# Patient Record
Sex: Male | Born: 1940
Health system: Southern US, Community
[De-identification: ages and names within clinical notes are randomized; demographics above are authoritative.]

## PROBLEM LIST (undated history)

## (undated) DIAGNOSIS — R011 Cardiac murmur, unspecified: Secondary | ICD-10-CM

## (undated) DIAGNOSIS — I4891 Unspecified atrial fibrillation: Secondary | ICD-10-CM

## (undated) DIAGNOSIS — I5042 Chronic combined systolic (congestive) and diastolic (congestive) heart failure: Secondary | ICD-10-CM

## (undated) DIAGNOSIS — I700 Atherosclerosis of aorta: Secondary | ICD-10-CM

## (undated) DIAGNOSIS — M109 Gout, unspecified: Secondary | ICD-10-CM

## (undated) DIAGNOSIS — E349 Endocrine disorder, unspecified: Secondary | ICD-10-CM

## (undated) DIAGNOSIS — N200 Calculus of kidney: Secondary | ICD-10-CM

## (undated) DIAGNOSIS — Z87442 Personal history of urinary calculi: Secondary | ICD-10-CM

## (undated) DIAGNOSIS — M1710 Unilateral primary osteoarthritis, unspecified knee: Secondary | ICD-10-CM

## (undated) DIAGNOSIS — N529 Male erectile dysfunction, unspecified: Secondary | ICD-10-CM

## (undated) DIAGNOSIS — F419 Anxiety disorder, unspecified: Secondary | ICD-10-CM

## (undated) DIAGNOSIS — E785 Hyperlipidemia, unspecified: Secondary | ICD-10-CM

## (undated) DIAGNOSIS — H269 Unspecified cataract: Secondary | ICD-10-CM

## (undated) DIAGNOSIS — R609 Edema, unspecified: Secondary | ICD-10-CM

## (undated) DIAGNOSIS — M519 Unspecified thoracic, thoracolumbar and lumbosacral intervertebral disc disorder: Secondary | ICD-10-CM

## (undated) DIAGNOSIS — I350 Nonrheumatic aortic (valve) stenosis: Secondary | ICD-10-CM

## (undated) DIAGNOSIS — I482 Chronic atrial fibrillation, unspecified: Secondary | ICD-10-CM

## (undated) DIAGNOSIS — R0602 Shortness of breath: Secondary | ICD-10-CM

## (undated) DIAGNOSIS — I10 Essential (primary) hypertension: Secondary | ICD-10-CM

## (undated) HISTORY — DX: Hyperlipidemia, unspecified: E78.5

## (undated) HISTORY — DX: Unspecified cataract: H26.9

## (undated) HISTORY — PX: OTHER SURGICAL HISTORY: SHX169

## (undated) HISTORY — DX: Calculus of kidney: N20.0

## (undated) HISTORY — PX: ACHILLES TENDON REPAIR: SUR1153

## (undated) HISTORY — DX: Gout, unspecified: M10.9

## (undated) HISTORY — DX: Unspecified thoracic, thoracolumbar and lumbosacral intervertebral disc disorder: M51.9

## (undated) HISTORY — DX: Atherosclerosis of aorta: I70.0

## (undated) HISTORY — DX: Unspecified atrial fibrillation: I48.91

## (undated) HISTORY — DX: Shortness of breath: R06.02

## (undated) HISTORY — PX: TONSILLECTOMY: SUR1361

## (undated) HISTORY — DX: Cardiac murmur, unspecified: R01.1

## (undated) HISTORY — DX: Male erectile dysfunction, unspecified: N52.9

## (undated) HISTORY — DX: Endocrine disorder, unspecified: E34.9

## (undated) HISTORY — DX: Chronic atrial fibrillation, unspecified: I48.20

## (undated) HISTORY — DX: Anxiety disorder, unspecified: F41.9

## (undated) HISTORY — DX: Unilateral primary osteoarthritis, unspecified knee: M17.10

## (undated) HISTORY — DX: Edema, unspecified: R60.9

---

## 2004-05-18 ENCOUNTER — Ambulatory Visit: Payer: Self-pay | Admitting: Urology

## 2005-04-12 ENCOUNTER — Ambulatory Visit: Payer: Self-pay | Admitting: Physician Assistant

## 2005-05-16 ENCOUNTER — Ambulatory Visit: Payer: Self-pay | Admitting: Neurosurgery

## 2006-09-05 ENCOUNTER — Ambulatory Visit: Payer: Self-pay | Admitting: Urology

## 2006-12-04 ENCOUNTER — Ambulatory Visit: Payer: Self-pay | Admitting: Unknown Physician Specialty

## 2006-12-11 ENCOUNTER — Ambulatory Visit: Payer: Self-pay | Admitting: Neurosurgery

## 2006-12-18 ENCOUNTER — Ambulatory Visit: Payer: Self-pay | Admitting: Unknown Physician Specialty

## 2006-12-28 ENCOUNTER — Emergency Department (HOSPITAL_COMMUNITY): Admission: EM | Admit: 2006-12-28 | Discharge: 2006-12-29 | Payer: Self-pay | Admitting: Emergency Medicine

## 2007-02-07 ENCOUNTER — Ambulatory Visit: Payer: Self-pay | Admitting: Internal Medicine

## 2008-10-28 ENCOUNTER — Ambulatory Visit: Payer: Self-pay | Admitting: Urology

## 2008-11-05 ENCOUNTER — Ambulatory Visit: Payer: Self-pay | Admitting: Urology

## 2008-11-10 ENCOUNTER — Ambulatory Visit: Payer: Self-pay | Admitting: Urology

## 2009-09-21 ENCOUNTER — Ambulatory Visit: Payer: Self-pay | Admitting: Urology

## 2009-09-22 ENCOUNTER — Ambulatory Visit: Payer: Self-pay | Admitting: Urology

## 2010-07-10 ENCOUNTER — Encounter: Payer: Self-pay | Admitting: Neurological Surgery

## 2011-05-01 ENCOUNTER — Ambulatory Visit: Payer: Self-pay | Admitting: Surgery

## 2012-07-08 ENCOUNTER — Ambulatory Visit: Payer: Self-pay | Admitting: Neurosurgery

## 2013-04-07 DIAGNOSIS — Z87442 Personal history of urinary calculi: Secondary | ICD-10-CM | POA: Insufficient documentation

## 2013-11-12 DIAGNOSIS — M519 Unspecified thoracic, thoracolumbar and lumbosacral intervertebral disc disorder: Secondary | ICD-10-CM | POA: Insufficient documentation

## 2013-11-18 ENCOUNTER — Ambulatory Visit: Payer: Self-pay | Admitting: Internal Medicine

## 2014-04-29 ENCOUNTER — Ambulatory Visit (HOSPITAL_COMMUNITY)
Admission: RE | Admit: 2014-04-29 | Discharge: 2014-04-29 | Disposition: A | Discharge status: Home / Self Care | Payer: Medicare Other | Source: Ambulatory Visit | Attending: Cardiovascular Disease | Admitting: Cardiovascular Disease

## 2014-04-29 ENCOUNTER — Other Ambulatory Visit (HOSPITAL_COMMUNITY): Payer: Self-pay | Admitting: Specialist

## 2014-04-29 DIAGNOSIS — M79661 Pain in right lower leg: Secondary | ICD-10-CM

## 2014-04-29 DIAGNOSIS — I82401 Acute embolism and thrombosis of unspecified deep veins of right lower extremity: Secondary | ICD-10-CM

## 2014-04-29 NOTE — Progress Notes (Signed)
Right Lower Ext. Venous Duplex Completed. No evidence of DVT or SVT. Oda Cogan, BS, RDMS, RVT

## 2014-05-01 ENCOUNTER — Telehealth (HOSPITAL_COMMUNITY): Payer: Self-pay | Admitting: *Deleted

## 2014-09-02 ENCOUNTER — Encounter (HOSPITAL_BASED_OUTPATIENT_CLINIC_OR_DEPARTMENT_OTHER): Payer: Self-pay

## 2014-09-02 NOTE — Progress Notes (Signed)
Date/Time: 09/02/2014, 2:44 PM  Interviewer: Melissa Noon  Patient Name: Danny Silva   DOB: 02/14/1941  SSN: [id number]  Gender: male  Patient Address:  [address]   [place] [address]      Patient Phone Number(s): [phone] (M)  Emergency Contact:  In chart, to be contacted ONLY in case of emergency     Caller:  Self   , Caller phone: , Caller relationship:  Reason for Call:  Pt reports continuous struggle with stopping medication including klonopin, lyrica, and suboxone   Referral Source: Oletta Cohn  Notes:    Insurance Info:  Pt is willing to self pay if insurance does not approve or cover.  Pt prefers inpt, as he has health concerns and lives in [place]   Company:  [employer]    Phone number: Providers: [phone]   Policy Holder [facility]):     Relationship to PT: self   PH DOB: [birth year]   Member ID: [id number]  Group #: [id number]  Insured Employer: self-purchased      Medicare     Clinical Information  Presenting Problem: wants to stop medication   Precipitating Event: Pt has been on suboxone maintenance for several years and has not been successful stopping or reducing use     Suicidal/Homicidal Risk?    Any hx of Suicidal thoughts or plans? No    Any hx of Homicidal Thoughts or Plans?  No    Imminent Risk of Suicide and/or Harm to Others?  No         Drug Use   Any current use of alcohol or drugs? narcotics and benzodiazepines    Do you use tobacco?: No    Drug of Choice # 1: Suboxone   Pattern of use: daily  8 mg films, 2.5 a day   Last use? 09/02/2014    Drug of Choice # 2 : Klonopin  Pattern of use: daily 1.5 - 2 mg daily   Last use? 09/02/14    Drug of Choice # 3 : Lyrica   Pattern of use: q8-10 hours 75 mg TID   Last use?: 09/02/2014    Additional drug use info:     Current Withdrawal Symptoms:  Denies, as he is currently using     History of and last date of:    Hallucinations? No          Seizures?  No    Psych/Mental health Issues:  Anxiety , difficulty sleeping     Medical issues:  Knee repair this  year, hx of kidney stones, denies anything currently     Current Medication (medical or psychiatric): Klonopin 1 mg 2 tabs     Additional Notes:  Pt was started on suboxone 4 years ago as a way to get off opiate pain medication.  He has been unsuccessful titration down with current provider.     Does the patient require Hard of Hearing Services? No    Does the patient require language services? 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

## 2015-02-11 ENCOUNTER — Other Ambulatory Visit: Payer: Self-pay | Admitting: Internal Medicine

## 2015-02-11 DIAGNOSIS — E785 Hyperlipidemia, unspecified: Secondary | ICD-10-CM

## 2015-02-11 DIAGNOSIS — E782 Mixed hyperlipidemia: Secondary | ICD-10-CM | POA: Insufficient documentation

## 2015-02-16 ENCOUNTER — Ambulatory Visit
Admission: RE | Admit: 2015-02-16 | Discharge: 2015-02-16 | Disposition: A | Discharge status: Home / Self Care | Payer: Medicare Other | Source: Ambulatory Visit | Attending: Internal Medicine | Admitting: Internal Medicine

## 2015-02-16 DIAGNOSIS — E785 Hyperlipidemia, unspecified: Secondary | ICD-10-CM | POA: Insufficient documentation

## 2015-11-03 DIAGNOSIS — D369 Benign neoplasm, unspecified site: Secondary | ICD-10-CM | POA: Insufficient documentation

## 2016-03-21 ENCOUNTER — Ambulatory Visit
Admission: RE | Admit: 2016-03-21 | Discharge: 2016-03-21 | Disposition: A | Discharge status: Home / Self Care | Payer: Medicare Other | Source: Ambulatory Visit | Attending: Neurosurgery | Admitting: Neurosurgery

## 2016-03-21 ENCOUNTER — Other Ambulatory Visit: Payer: Self-pay | Admitting: Neurosurgery

## 2016-03-21 DIAGNOSIS — M4302 Spondylolysis, cervical region: Secondary | ICD-10-CM | POA: Diagnosis not present

## 2016-03-21 DIAGNOSIS — M8580 Other specified disorders of bone density and structure, unspecified site: Secondary | ICD-10-CM | POA: Diagnosis not present

## 2016-11-29 ENCOUNTER — Other Ambulatory Visit: Payer: Self-pay | Admitting: Neurosurgery

## 2016-11-29 DIAGNOSIS — M4306 Spondylolysis, lumbar region: Secondary | ICD-10-CM

## 2016-12-07 ENCOUNTER — Ambulatory Visit
Admission: RE | Admit: 2016-12-07 | Discharge: 2016-12-07 | Disposition: A | Discharge status: Home / Self Care | Payer: Medicare Other | Source: Ambulatory Visit | Attending: Neurosurgery | Admitting: Neurosurgery

## 2016-12-07 DIAGNOSIS — Q619 Cystic kidney disease, unspecified: Secondary | ICD-10-CM | POA: Insufficient documentation

## 2016-12-07 DIAGNOSIS — M47816 Spondylosis without myelopathy or radiculopathy, lumbar region: Secondary | ICD-10-CM | POA: Diagnosis present

## 2016-12-07 DIAGNOSIS — M4306 Spondylolysis, lumbar region: Secondary | ICD-10-CM

## 2017-05-02 ENCOUNTER — Telehealth: Payer: Self-pay | Admitting: Urology

## 2017-05-02 DIAGNOSIS — E291 Testicular hypofunction: Secondary | ICD-10-CM

## 2017-05-02 NOTE — Telephone Encounter (Signed)
Pt assistant, Pamala Hurry (work's for Mr. Fess) called office asking for Testosterone refill for Devin Buckley.  Please advise. Thanks.

## 2017-05-04 NOTE — Telephone Encounter (Signed)
Okay to call in testosterone cypionate 200 mg IM every 2 weeks.  He needs a testosterone level, PSA and hematocrit in approximately 1 month

## 2017-05-14 MED ORDER — TESTOSTERONE CYPIONATE 200 MG/ML IM SOLN: 200.0000 mg | INTRAMUSCULAR | 0 refills | Status: DC

## 2017-05-14 NOTE — Addendum Note (Signed)
Addended by: Toniann Fail C on: 05/14/2017 12:00 PM   Modules accepted: Orders

## 2017-05-14 NOTE — Telephone Encounter (Signed)
Spoke with pt in reference to medication refill and needing labs/OV. Pt voiced understanding. Lab and OV appts made. Orders placed.

## 2017-05-21 ENCOUNTER — Other Ambulatory Visit: Payer: Medicare Other

## 2017-05-21 DIAGNOSIS — E291 Testicular hypofunction: Secondary | ICD-10-CM

## 2017-05-22 ENCOUNTER — Telehealth: Payer: Self-pay

## 2017-05-22 LAB — HEMATOCRIT: HEMATOCRIT: 49.4 % (ref 37.5–51.0)

## 2017-05-22 LAB — PSA: Prostate Specific Ag, Serum: 1.3 ng/mL (ref 0.0–4.0)

## 2017-05-22 LAB — TESTOSTERONE

## 2017-05-22 NOTE — Telephone Encounter (Signed)
-----   Message from Abbie Sons, MD sent at 05/22/2017  8:55 AM EST ----- Testosterone level was significantly elevated at >1500.  Chart review indicated he is taking 200 mg every 2 weeks.  Please verify this dose and see when his last injection was performed in relation to this blood draw.  Recommend he hold his testosterone for 1 month and repeat a testosterone level at that time.  PSA and hematocrit were normal.

## 2017-05-22 NOTE — Telephone Encounter (Signed)
Spoke with pt in reference to testosterone labs and needing to stop medication x26month and redraw labs. Pt stated that he had his injection 1 week prior to labs. Pt voiced understanding of whole conversation and requested lab results be mailed to him.

## 2017-05-30 ENCOUNTER — Ambulatory Visit: Payer: Self-pay | Admitting: Urology

## 2017-05-30 ENCOUNTER — Encounter: Payer: Self-pay | Admitting: Urology

## 2017-06-13 ENCOUNTER — Other Ambulatory Visit: Payer: Self-pay

## 2017-06-13 ENCOUNTER — Other Ambulatory Visit: Payer: Medicare Other

## 2017-06-13 DIAGNOSIS — E291 Testicular hypofunction: Secondary | ICD-10-CM

## 2017-06-14 ENCOUNTER — Other Ambulatory Visit: Payer: Self-pay

## 2017-06-14 ENCOUNTER — Telehealth: Payer: Self-pay

## 2017-06-14 ENCOUNTER — Other Ambulatory Visit: Payer: Self-pay | Admitting: Urology

## 2017-06-14 LAB — TESTOSTERONE: TESTOSTERONE: 153 ng/dL — AB (ref 264–916)

## 2017-06-14 MED ORDER — TESTOSTERONE CYPIONATE 200 MG/ML IM SOLN: 100.0000 mg | INTRAMUSCULAR | 0 refills | Status: DC

## 2017-06-14 NOTE — Telephone Encounter (Signed)
Pt requested testosterone refill be sent to [facility]. Lab appt has been made and results given.

## 2017-06-14 NOTE — Telephone Encounter (Signed)
Patient notified and scheduled 

## 2017-06-14 NOTE — Telephone Encounter (Signed)
-----   Message from Abbie Sons, MD sent at 06/14/2017  7:51 AM EST ----- T level was 153. Restart testosterone at 100 mg (0.5cc) every 2 weeks.  Repeat T level in 4-6 weeks

## 2017-06-14 NOTE — Telephone Encounter (Signed)
Left pt mess to call 

## 2017-06-15 MED ORDER — TESTOSTERONE CYPIONATE 200 MG/ML IM SOLN: 100.0000 mg | INTRAMUSCULAR | 0 refills | Status: DC

## 2017-06-15 NOTE — Telephone Encounter (Signed)
Rx sent 

## 2017-07-11 ENCOUNTER — Telehealth: Payer: Self-pay

## 2017-07-11 NOTE — Telephone Encounter (Signed)
Pt called stating since starting testosterone he has been loosing large amounts of hair. Pt stated that he is seeing Dr. Jeanene Erb for hair transplants with stem cells via [facility]. Pt stated that Dr. Jeanene Erb advised him to contact [facility] as he feels the hair loss is from the testosterone. Please advise.

## 2017-07-15 NOTE — Telephone Encounter (Signed)
Testosterone can cause hair loss in genetically predisposed individuals.  I would recommend discontinuing the testosterone if he is concerned about the hair loss.  He can check with his dermatologist to see if he or she thinks that a 5 alpha reductase inhibitor would be of benefit.

## 2017-07-17 NOTE — Telephone Encounter (Signed)
Pt has an appt scheduled as he wishes to discuss further with Dr. Bernardo Heater.

## 2017-07-19 ENCOUNTER — Ambulatory Visit (INDEPENDENT_AMBULATORY_CARE_PROVIDER_SITE_OTHER): Payer: Medicare Other | Admitting: Urology

## 2017-07-19 ENCOUNTER — Encounter: Payer: Self-pay | Admitting: Urology

## 2017-07-19 DIAGNOSIS — E291 Testicular hypofunction: Secondary | ICD-10-CM | POA: Diagnosis not present

## 2017-07-19 MED ORDER — FINASTERIDE 1 MG PO TABS: 1.0000 mg | ORAL_TABLET | Freq: Every day | ORAL | 3 refills | Status: DC

## 2017-07-19 NOTE — Progress Notes (Signed)
   07/19/2017 12:39 PM   Devin Buckley [birth year] [id number]  Referring provider: Rusty Aus, MD [place] [facility] [place], [place] [address]  Chief Complaint  Patient presents with  . Other    discuss hair loss with testosterone therapy    HPI: 77 year old male presents for the above chief complaint.  He remains on testosterone therapy at a lower dose which was adjusted due to elevated levels.  He is in the process of getting stem cell therapy for hair loss and has noted increased hair loss since starting testosterone.   PMH: No past medical history on file.  Surgical History: History reviewed. No pertinent surgical history.  Home Medications:  Allergies as of 07/19/2017   No Known Allergies     Medication List        Accurate as of 07/19/17 12:39 PM. Always use your most recent med list.          clonazePAM 0.5 MG tablet Commonly known as:  KLONOPIN Take by mouth.   Naltrexone-Bupropion HCl ER 8-90 MG Tb12 Take 8-90 mg by mouth.   testosterone cypionate 200 MG/ML injection Commonly known as:  DEPOTESTOSTERONE CYPIONATE Inject 0.5 mLs (100 mg total) into the muscle every 14 (fourteen) days.       Allergies: No Known Allergies  Family History: No family history on file.  Social History:  reports that he has quit smoking. he has never used smokeless tobacco. He reports that he does not drink alcohol or use drugs.  ROS: UROLOGY Frequent Urination?: No Hard to postpone urination?: No Burning/pain with urination?: No Get up at night to urinate?: No Leakage of urine?: No Urine stream starts and stops?: No Trouble starting stream?: No Do you have to strain to urinate?: No Blood in urine?: No Urinary tract infection?: No Sexually transmitted disease?: No Injury to kidneys or bladder?: No Painful intercourse?: No Weak stream?: No Erection problems?: No Penile pain?: No  Gastrointestinal Nausea?: No Vomiting?:  No Indigestion/heartburn?: No Diarrhea?: No Constipation?: No  Constitutional Fever: No Night sweats?: No Weight loss?: No Fatigue?: No  Skin Skin rash/lesions?: No Itching?: No  Eyes Blurred vision?: No Double vision?: No  Ears/Nose/Throat Sore throat?: No Sinus problems?: No  Hematologic/Lymphatic Swollen glands?: No Easy bruising?: No  Cardiovascular Leg swelling?: No Chest pain?: No  Respiratory Cough?: No Shortness of breath?: No  Endocrine Excessive thirst?: No  Musculoskeletal Back pain?: No Joint pain?: No  Neurological Headaches?: No Dizziness?: No  Psychologic Depression?: No Anxiety?: No  Physical Exam: There were no vitals taken for this visit.  Constitutional:  Alert and oriented, No acute distress.   Laboratory Data: Lab Results  Component Value Date   HCT 49.4 05/21/2017    Lab Results  Component Value Date   PSA1 1.3 05/21/2017    Lab Results  Component Value Date   TESTOSTERONE 153 (L) 06/13/2017     Assessment & Plan:  Discussed the possibility of DHT related hair loss.  Rx finasteride 1 mg was sent to his pharmacy.  He is due for a follow-up testosterone level next week.  Recent PSA was unremarkable.    Abbie Sons, [place] [address], [place], [place] [address] [phone]

## 2017-07-25 ENCOUNTER — Other Ambulatory Visit: Payer: Medicare Other

## 2017-07-25 DIAGNOSIS — E291 Testicular hypofunction: Secondary | ICD-10-CM

## 2017-07-26 LAB — TESTOSTERONE: Testosterone: 309 ng/dL (ref 264–916)

## 2017-07-30 ENCOUNTER — Telehealth: Payer: Self-pay

## 2017-07-30 NOTE — Telephone Encounter (Signed)
Letter sent.

## 2017-07-30 NOTE — Telephone Encounter (Signed)
-----   Message from Abbie Sons, MD sent at 07/27/2017  1:11 PM EST ----- Testosterone level looks good at 309

## 2017-08-28 ENCOUNTER — Ambulatory Visit (INDEPENDENT_AMBULATORY_CARE_PROVIDER_SITE_OTHER): Payer: Medicare Other | Admitting: Urology

## 2017-08-28 ENCOUNTER — Encounter: Payer: Self-pay | Admitting: Urology

## 2017-08-28 ENCOUNTER — Ambulatory Visit
Admission: RE | Admit: 2017-08-28 | Discharge: 2017-08-28 | Disposition: A | Discharge status: Home / Self Care | Payer: Medicare Other | Source: Ambulatory Visit | Attending: Urology | Admitting: Urology

## 2017-08-28 VITALS — BP 139/96 | HR 66 | Ht 74.0 in | Wt 210.0 lb

## 2017-08-28 DIAGNOSIS — N200 Calculus of kidney: Secondary | ICD-10-CM | POA: Diagnosis not present

## 2017-08-28 DIAGNOSIS — E291 Testicular hypofunction: Secondary | ICD-10-CM | POA: Diagnosis not present

## 2017-08-28 DIAGNOSIS — R109 Unspecified abdominal pain: Secondary | ICD-10-CM

## 2017-08-28 LAB — URINALYSIS, COMPLETE
BILIRUBIN UA: NEGATIVE
GLUCOSE, UA: NEGATIVE
KETONES UA: NEGATIVE
LEUKOCYTES UA: NEGATIVE
Nitrite, UA: NEGATIVE
Protein, UA: NEGATIVE
SPEC GRAV UA: 1.01 (ref 1.005–1.030)
Urobilinogen, Ur: 0.2 mg/dL (ref 0.2–1.0)
pH, UA: 6.5 (ref 5.0–7.5)

## 2017-08-28 LAB — MICROSCOPIC EXAMINATION
Bacteria, UA: NONE SEEN
EPITHELIAL CELLS (NON RENAL): NONE SEEN /HPF (ref 0–10)
WBC, UA: NONE SEEN /hpf (ref 0–?)

## 2017-08-28 NOTE — Progress Notes (Signed)
08/28/2017 2:06 PM   Devin Buckley 03/28/1941 [id number]  Referring provider: Rusty Aus, MD [place] [facility] [place], [place] [address]  Chief Complaint  Patient presents with  . Nephrolithiasis  . Results    HPI: 77 year old male followed for renal cysts, nephrolithiasis, hypogonadism and erectile dysfunction.  He presents today with a one-month history of left flank pain which radiates to the left upper quadrant.  There are no identifiable aggravating or alleviating factors.  He states a heavy meal tends to precipitate this pain.  He denies gross hematuria however states his urine has been darker the past 2-3 weeks.  He denies dysuria or gross hematuria.  He denies flank, abdominal, pelvic or scrotal pain.  His last CT in July 2018 at [facility] showed large bilateral renal cyst and a 12 mm right renal calculus which was nonobstructing.  He is also requesting that his testosterone level be increased slightly.  His last level was 309.   PMH: No past medical history on file.  Surgical History: No past surgical history on file.  Home Medications:  Allergies as of 08/28/2017   No Known Allergies     Medication List        Accurate as of 08/28/17  2:06 PM. Always use your most recent med list.          clonazePAM 0.5 MG tablet Commonly known as:  KLONOPIN Take by mouth.   finasteride 1 MG tablet Commonly known as:  PROPECIA Take 1 tablet (1 mg total) by mouth daily.   Naltrexone-buPROPion HCl ER 8-90 MG Tb12 Take 8-90 mg by mouth.   testosterone cypionate 200 MG/ML injection Commonly known as:  DEPOTESTOSTERONE CYPIONATE Inject 0.5 mLs (100 mg total) into the muscle every 14 (fourteen) days.       Allergies: No Known Allergies  Family History: No family history on file.  Social History:  reports that he has quit smoking. he has never used smokeless tobacco. He reports that he does not drink alcohol or use  drugs.  ROS: UROLOGY Frequent Urination?: No Hard to postpone urination?: No Burning/pain with urination?: No Get up at night to urinate?: No Leakage of urine?: No Urine stream starts and stops?: No Trouble starting stream?: No Do you have to strain to urinate?: No Blood in urine?: Yes Urinary tract infection?: No Sexually transmitted disease?: No Injury to kidneys or bladder?: No Painful intercourse?: No Weak stream?: No Erection problems?: No Penile pain?: No  Gastrointestinal Nausea?: No Vomiting?: No Indigestion/heartburn?: No Diarrhea?: No Constipation?: No  Constitutional Fever: No Night sweats?: No Weight loss?: No Fatigue?: No  Skin Skin rash/lesions?: No Itching?: No  Eyes Blurred vision?: No Double vision?: No  Ears/Nose/Throat Sore throat?: No Sinus problems?: No  Hematologic/Lymphatic Swollen glands?: No Easy bruising?: No  Cardiovascular Leg swelling?: No Chest pain?: No  Respiratory Cough?: No Shortness of breath?: No  Endocrine Excessive thirst?: No  Musculoskeletal Back pain?: No Joint pain?: No  Neurological Headaches?: No Dizziness?: No  Psychologic Depression?: No Anxiety?: No  Physical Exam: BP (!) 139/96   Pulse 66   Ht 6\' 2"  (1.88 m)   Wt 210 lb (95.3 kg)   BMI 26.96 kg/m   Constitutional:  Alert and oriented, No acute distress. HEENT: [place] AT, moist mucus membranes.  Trachea midline, no masses. Cardiovascular: No clubbing, cyanosis, or edema. Respiratory: Normal respiratory effort, no increased work of breathing. GI: Abdomen is soft, nontender, nondistended, no abdominal masses GU: No  CVA tenderness Lymph: No cervical or inguinal lymphadenopathy. Skin: No rashes, bruises or suspicious lesions. Neurologic: Grossly intact, no focal deficits, moving all 4 extremities. Psychiatric: Normal mood and affect.  Laboratory Data: Lab Results  Component Value Date   HCT 49.4 05/21/2017    Lab Results  Component  Value Date   TESTOSTERONE 309 07/25/2017     Urinalysis Dipstick/microscopy negative  Pertinent Imaging: KUB performed today was reviewed.  There is a stable 12 mm right renal calculus.  No abnormal left-sided renal calcifications are identified.  Assessment & Plan:   77 year old male with mild left flank pain.  He has right nephrolithiasis.  Will schedule a renal ultrasound and he will be notified with results.  Urinalysis today showed no microhematuria or pyuria.  Will increase his testosterone to every 2 weeks.  Follow-up testosterone level in 6 weeks.  1. Nephrolithiasis  - Abdomen 1 view (KUB); Future - Ultrasound renal complete; Future  2. Flank pain  - Ultrasound renal complete; Future  3. Hypogonadism in male  - Testosterone; Future    Abbie Sons, [place] [address] [place], [place] [address] [phone]

## 2017-08-29 ENCOUNTER — Telehealth: Payer: Self-pay

## 2017-08-29 NOTE — Telephone Encounter (Signed)
Called and spoke w/ pt about ua.

## 2017-08-29 NOTE — Telephone Encounter (Signed)
-----   Message from Abbie Sons, MD sent at 08/29/2017  7:11 AM EDT ----- Urinalysis did not show any blood

## 2017-09-03 ENCOUNTER — Ambulatory Visit
Admission: RE | Admit: 2017-09-03 | Discharge: 2017-09-03 | Disposition: A | Discharge status: Home / Self Care | Payer: Medicare Other | Source: Ambulatory Visit | Attending: Urology | Admitting: Urology

## 2017-09-03 ENCOUNTER — Encounter: Payer: Self-pay | Admitting: Urology

## 2017-09-03 DIAGNOSIS — N200 Calculus of kidney: Secondary | ICD-10-CM

## 2017-09-03 DIAGNOSIS — N281 Cyst of kidney, acquired: Secondary | ICD-10-CM | POA: Insufficient documentation

## 2017-09-03 DIAGNOSIS — R109 Unspecified abdominal pain: Secondary | ICD-10-CM

## 2017-09-03 MED ORDER — TESTOSTERONE CYPIONATE 200 MG/ML IM SOLN: 120.0000 mg | INTRAMUSCULAR | 0 refills | Status: DC

## 2017-09-05 ENCOUNTER — Telehealth: Payer: Self-pay

## 2017-09-05 NOTE — Telephone Encounter (Signed)
LMOM and letter sent.

## 2017-09-05 NOTE — Telephone Encounter (Signed)
-----   Message from Abbie Sons, MD sent at 09/04/2017  8:36 AM EDT ----- Renal ultrasound showed stable renal cyst and a stable renal calculus.  No findings that would suggest a urologic cause of his back pain.

## 2017-09-06 ENCOUNTER — Other Ambulatory Visit: Payer: Self-pay | Admitting: Internal Medicine

## 2017-09-06 DIAGNOSIS — R1084 Generalized abdominal pain: Secondary | ICD-10-CM

## 2017-09-17 ENCOUNTER — Ambulatory Visit
Admission: RE | Admit: 2017-09-17 | Discharge: 2017-09-17 | Disposition: A | Discharge status: Home / Self Care | Payer: Medicare Other | Source: Ambulatory Visit | Attending: Internal Medicine | Admitting: Internal Medicine

## 2017-09-17 ENCOUNTER — Other Ambulatory Visit
Admission: RE | Admit: 2017-09-17 | Discharge: 2017-09-17 | Disposition: A | Discharge status: Home / Self Care | Payer: Medicare Other | Source: Ambulatory Visit | Attending: Internal Medicine | Admitting: Internal Medicine

## 2017-09-17 DIAGNOSIS — N200 Calculus of kidney: Secondary | ICD-10-CM | POA: Insufficient documentation

## 2017-09-17 DIAGNOSIS — R1084 Generalized abdominal pain: Secondary | ICD-10-CM | POA: Diagnosis not present

## 2017-09-17 DIAGNOSIS — K5730 Diverticulosis of large intestine without perforation or abscess without bleeding: Secondary | ICD-10-CM | POA: Diagnosis not present

## 2017-09-17 DIAGNOSIS — I700 Atherosclerosis of aorta: Secondary | ICD-10-CM | POA: Diagnosis not present

## 2017-09-17 LAB — CREATININE, SERUM
Creatinine, Ser: 0.97 mg/dL (ref 0.61–1.24)
GFR calc Af Amer: >60 mL/min (ref >60)
GFR calc non Af Amer: >60 mL/min (ref >60)

## 2017-09-17 MED ORDER — IOPAMIDOL (ISOVUE-300) INJECTION 61%
100.0000 mL | Freq: Once | INTRAVENOUS | Status: AC | PRN
  Administered 2017-09-17: 100 mL via INTRAVENOUS

## 2017-09-18 DIAGNOSIS — I700 Atherosclerosis of aorta: Secondary | ICD-10-CM | POA: Insufficient documentation

## 2017-10-09 ENCOUNTER — Other Ambulatory Visit: Payer: Medicare Other

## 2017-10-09 DIAGNOSIS — E291 Testicular hypofunction: Secondary | ICD-10-CM

## 2017-10-10 ENCOUNTER — Telehealth: Payer: Self-pay

## 2017-10-10 LAB — TESTOSTERONE: Testosterone: 461 ng/dL (ref 264–916)

## 2017-10-10 NOTE — Telephone Encounter (Signed)
-----   Message from Abbie Sons, MD sent at 10/10/2017  6:57 AM EDT ----- Testosterone level good at 461.  Continue present dose.

## 2017-10-10 NOTE — Telephone Encounter (Signed)
Pt informed

## 2017-10-25 DIAGNOSIS — I350 Nonrheumatic aortic (valve) stenosis: Secondary | ICD-10-CM | POA: Insufficient documentation

## 2017-11-21 ENCOUNTER — Other Ambulatory Visit: Payer: Self-pay | Admitting: Neurosurgery

## 2017-11-21 DIAGNOSIS — M47812 Spondylosis without myelopathy or radiculopathy, cervical region: Secondary | ICD-10-CM

## 2017-12-05 ENCOUNTER — Ambulatory Visit
Admission: RE | Admit: 2017-12-05 | Discharge: 2017-12-05 | Disposition: A | Discharge status: Home / Self Care | Payer: Medicare Other | Source: Ambulatory Visit | Attending: Neurosurgery | Admitting: Neurosurgery

## 2017-12-05 DIAGNOSIS — M47812 Spondylosis without myelopathy or radiculopathy, cervical region: Secondary | ICD-10-CM

## 2017-12-05 DIAGNOSIS — M4802 Spinal stenosis, cervical region: Secondary | ICD-10-CM | POA: Diagnosis not present

## 2017-12-25 ENCOUNTER — Other Ambulatory Visit: Payer: Self-pay

## 2017-12-25 MED ORDER — TESTOSTERONE CYPIONATE 200 MG/ML IM SOLN: 120.0000 mg | INTRAMUSCULAR | 0 refills | Status: DC

## 2018-01-22 ENCOUNTER — Telehealth: Payer: Self-pay | Admitting: Radiology

## 2018-01-22 NOTE — Telephone Encounter (Signed)
Pt called office asking if his Testosterone can be increased from 125 to 140 or 145. Please advise pt @ [phone].

## 2018-01-22 NOTE — Telephone Encounter (Signed)
Patient [facility] requesting return call from a nurse. Can be reached at [phone]. Was last seen by Dr Bernardo Heater 08/28/2017.

## 2018-01-22 NOTE — Telephone Encounter (Signed)
lmom for pt to call office

## 2018-01-23 ENCOUNTER — Other Ambulatory Visit: Payer: Medicare Other

## 2018-01-23 DIAGNOSIS — E291 Testicular hypofunction: Secondary | ICD-10-CM

## 2018-01-23 NOTE — Telephone Encounter (Signed)
Left detailed message.   

## 2018-01-23 NOTE — Telephone Encounter (Signed)
He would need a trough testosterone level prior to any dose change

## 2018-01-24 LAB — TESTOSTERONE: Testosterone: 296 ng/dL (ref 264–916)

## 2018-01-27 ENCOUNTER — Other Ambulatory Visit: Payer: Self-pay | Admitting: Urology

## 2018-01-27 MED ORDER — TESTOSTERONE CYPIONATE 200 MG/ML IM SOLN: 140.0000 mg | INTRAMUSCULAR | 0 refills | Status: DC

## 2018-01-28 NOTE — Telephone Encounter (Signed)
Patient had labs drawn 01/23/2018.    Message from [facility]: "Testosterone level was 296. Can increase the dose to 0.7 mL every 14 days"  Patient requesting that we send this information to Devin Buckley at the [place].

## 2018-02-01 ENCOUNTER — Telehealth: Payer: Self-pay | Admitting: Radiology

## 2018-02-01 MED ORDER — TESTOSTERONE CYPIONATE 200 MG/ML IM SOLN: 140.0000 mg | INTRAMUSCULAR | 0 refills | Status: DC

## 2018-02-01 NOTE — Addendum Note (Signed)
Addended by: John Giovanni C on: 02/01/2018 04:01 PM   Modules accepted: Orders

## 2018-02-01 NOTE — Telephone Encounter (Signed)
[facility] pharmacy called the office.    Rx information was provided over the phone.

## 2018-02-01 NOTE — Telephone Encounter (Signed)
rx sent

## 2018-02-01 NOTE — Telephone Encounter (Signed)
Patient states testosterone dose was increased. A script was sent to [facility] pharmacy on 01/27/2018 but they didn't receive it. Patient is due for next injection on 02/04/2018. Please advise.

## 2018-02-01 NOTE — Telephone Encounter (Signed)
Made patient aware of script sent to pharmacy. 

## 2018-03-28 NOTE — Pre-Procedure Instructions (Signed)
Devin Buckley  03/28/2018      [place], [address] [place] [address] Phone: [phone] Fax: [phone]    Your procedure is scheduled on Oct. 21  Report to [facility] Admitting at 5:30  A.M.  Call this number if you have problems the morning of surgery:  [phone]   Remember:  Do not eat or drink after midnight.                       Take these medicines the morning of surgery with A SIP OF WATER :              Clonazepam (klonopin)              7 days prior to surgery STOP taking any Aspirin(unless otherwise instructed by your surgeon), Aleve, Naproxen, Ibuprofen, Motrin, Advil, Goody's, BC's, all herbal medications, fish oil, and all vitamins              Stop suboxone 3 days prior to surgery.     Do not wear jewelry.  Do not wear lotions, powders, or perfumes, or deodorant.  Do not shave 48 hours prior to surgery.  Men may shave face and neck.  Do not bring valuables to the hospital.  [facility] is not responsible for any belongings or valuables.  Contacts, dentures or bridgework may not be worn into surgery.  Leave your suitcase in the car.  After surgery it may be brought to your room.  For patients admitted to the hospital, discharge time will be determined by your treatment team.  Patients discharged the day of surgery will not be allowed to drive home.    Special instructions:   [facility]- Preparing For Surgery  Before surgery, you can play an important role. Because skin is not sterile, your skin needs to be as free of germs as possible. You can reduce the number of germs on your skin by washing with CHG (chlorahexidine gluconate) Soap before surgery.  CHG is an antiseptic cleaner which kills germs and bonds with the skin to continue killing germs even after washing.    Oral Hygiene is also important to reduce your risk of infection.  Remember - BRUSH YOUR  TEETH THE MORNING OF SURGERY WITH YOUR REGULAR TOOTHPASTE  Please do not use if you have an allergy to CHG or antibacterial soaps. If your skin becomes reddened/irritated stop using the CHG.  Do not shave (including legs and underarms) for at least 48 hours prior to first CHG shower. It is OK to shave your face.  Please follow these instructions carefully.   1. Shower the NIGHT BEFORE SURGERY and the MORNING OF SURGERY with CHG.   2. If you chose to wash your hair, wash your hair first as usual with your normal shampoo.  3. After you shampoo, rinse your hair and body thoroughly to remove the shampoo.  4. Use CHG as you would any other liquid soap. You can apply CHG directly to the skin and wash gently with a scrungie or a clean washcloth.   5. Apply the CHG Soap to your body ONLY FROM THE NECK DOWN.  Do not use on open wounds or open sores. Avoid contact with your eyes, ears, mouth and genitals (private parts). Wash Face and genitals (private parts)  with your normal soap.  6. Wash thoroughly, paying special attention to  the area where your surgery will be performed.  7. Thoroughly rinse your body with warm water from the neck down.  8. DO NOT shower/wash with your normal soap after using and rinsing off the CHG Soap.  9. Pat yourself dry with a CLEAN TOWEL.  10. Wear CLEAN PAJAMAS to bed the night before surgery, wear comfortable clothes the morning of surgery  11. Place CLEAN SHEETS on your bed the night of your first shower and DO NOT SLEEP WITH PETS.    Day of Surgery:  Do not apply any deodorants/lotions.  Please wear clean clothes to the hospital/surgery center.   Remember to brush your teeth WITH YOUR REGULAR TOOTHPASTE.    Please read over the following fact sheets that you were given.

## 2018-04-01 ENCOUNTER — Encounter (HOSPITAL_COMMUNITY)
Admission: RE | Admit: 2018-04-01 | Discharge: 2018-04-01 | Disposition: A | Discharge status: Home / Self Care | Payer: Medicare Other | Source: Ambulatory Visit | Attending: Plastic Surgery | Admitting: Plastic Surgery

## 2018-04-01 ENCOUNTER — Encounter (HOSPITAL_COMMUNITY): Payer: Self-pay

## 2018-04-01 ENCOUNTER — Other Ambulatory Visit: Payer: Self-pay

## 2018-04-01 DIAGNOSIS — I493 Ventricular premature depolarization: Secondary | ICD-10-CM | POA: Insufficient documentation

## 2018-04-01 DIAGNOSIS — I4430 Unspecified atrioventricular block: Secondary | ICD-10-CM | POA: Diagnosis not present

## 2018-04-01 DIAGNOSIS — Z01818 Encounter for other preprocedural examination: Secondary | ICD-10-CM | POA: Diagnosis present

## 2018-04-01 HISTORY — DX: Personal history of urinary calculi: Z87.442

## 2018-04-01 HISTORY — DX: Cardiac murmur, unspecified: R01.1

## 2018-04-01 LAB — BASIC METABOLIC PANEL
ANION GAP: 6 (ref 5–15)
BUN: 17 mg/dL (ref 8–23)
CO2: 29 mmol/L (ref 22–32)
Calcium: 8.9 mg/dL (ref 8.9–10.3)
Chloride: 104 mmol/L (ref 98–111)
Creatinine, Ser: 1.05 mg/dL (ref 0.61–1.24)
GFR calc Af Amer: >60 mL/min (ref >60)
GLUCOSE: 119 mg/dL — AB (ref 70–99)
POTASSIUM: 4.2 mmol/L (ref 3.5–5.1)
SODIUM: 139 mmol/L (ref 135–145)

## 2018-04-01 LAB — CBC
HCT: 51.4 % (ref 39.0–52.0)
HEMOGLOBIN: 16.3 g/dL (ref 13.0–17.0)
MCH: 29.9 pg (ref 26.0–34.0)
MCHC: 31.7 g/dL (ref 30.0–36.0)
MCV: 94.3 fL (ref 80.0–100.0)
Platelets: 132 10*3/uL — ABNORMAL LOW (ref 150–400)
RBC: 5.45 MIL/uL (ref 4.22–5.81)
RDW: 13.3 % (ref 11.5–15.5)
WBC: 5.5 10*3/uL (ref 4.0–10.5)
nRBC: 0 % (ref 0.0–0.2)

## 2018-04-01 NOTE — Progress Notes (Signed)
PCP  Emily Filbert  MD  Pt. Refuses to stop suboxone prior to surgery. Spoke with anesthesia PA states that pt. May continue suboxone  If he refuses.   Echo  10-22-2017  Does not see a cardiologist,PCP does ECHO to check on heart murmur.  Copy of ECHO in the chart.

## 2018-04-02 NOTE — Progress Notes (Signed)
Anesthesia Chart Review:  Case:  [id number] Date/Time:  04/08/18 0715   Procedures:      RHYTIDECTOMY (N/A )     BLEPHAROPLASTY BILATERAL LOWER LID (Bilateral )   Anesthesia type:  General   Pre-op diagnosis:  COSMETIC SURGERY, FACELIFT   Location:  [facility] OR ROOM 08 / [place] OR   Surgeon:  Bea Graff, MD      DISCUSSION: 77 yo male never smoker. Pertinent hx includes Gout, AS (mod by echo 2019).  Per PAT nursing note pt refused to stop suboxone prior to surgery.   Pt had recent echo ordered by PCP to eval murmur. Showed EF >55%, trivial AR, moderate AS 27.80mmHg mean gradient, grade 2 dd.  Anticipate he can proceed as planned barring acute status change.  VS: BP 126/78   Pulse 71   Temp 36.6 C   Resp 20   Ht 6\' 2"  (1.88 m)   Wt 99.7 kg   SpO2 98%   BMI 28.21 kg/m   PROVIDERS: Rusty Aus, MD is PCP   LABS: Labs reviewed: Acceptable for surgery. Mild thrombocytopenia. (all labs ordered are listed, but only abnormal results are displayed)  Labs Reviewed  BASIC METABOLIC PANEL - Abnormal; Notable for the following components:      Result Value   Glucose, Bld 119 (*)    All other components within normal limits  CBC - Abnormal; Notable for the following components:   Platelets 132 (*)    All other components within normal limits     IMAGES: N/A   EKG:  04/01/2018: Sinus rhythm rate 68 with 1st degree A-V block with occasional Premature ventricular complexes  CV: TTE5/11/2017 (outside record, copy on pt chart): Interpretation: Normal left ventricular systolic function EF >44%.  Normal right ventricular systolic function.  Mild aortic valve regurgitation.  Moderate aortic valve stenosis. 27.7 mmHg mean gradient.  Past Medical History:  Diagnosis Date  . Heart murmur   . History of kidney stones     Past Surgical History:  Procedure Laterality Date  . ACHILLES TENDON REPAIR Left   . arthroscopic shoulder  Right     MEDICATIONS: . buprenorphine-naloxone  (SUBOXONE) 8-2 mg SUBL SL tablet  . clonazePAM (KLONOPIN) 0.5 MG tablet  . Docusate Calcium (STOOL SOFTENER PO)  . finasteride (PROPECIA) 1 MG tablet  . Multiple Vitamins-Minerals (ICAPS AREDS 2) CAPS  . testosterone cypionate (DEPOTESTOSTERONE CYPIONATE) 200 MG/ML injection   No current facility-administered medications for this encounter.     [place], [place] [facility] Short Stay Center/Anesthesiology Phone [phone] 04/02/2018 12:52 PM

## 2018-04-04 ENCOUNTER — Other Ambulatory Visit: Payer: Self-pay | Admitting: Plastic Surgery

## 2018-04-04 ENCOUNTER — Encounter (HOSPITAL_COMMUNITY): Payer: Self-pay | Admitting: Plastic Surgery

## 2018-04-05 ENCOUNTER — Other Ambulatory Visit: Payer: Self-pay | Admitting: Plastic Surgery

## 2018-04-05 NOTE — H&P (View-Only) (Signed)
Devin Buckley is an 77 y.o. male.   Chief Complaint: facial l  Past Medical History:  Diagnosis Date  . Heart murmur   . History of kidney stones     Past Surgical History:  Procedure Laterality Date  . ACHILLES TENDON REPAIR Left   . arthroscopic shoulder  Right     No family history on file. Social History:  reports that he has never smoked. He has never used smokeless tobacco. He reports that he does not drink alcohol or use drugs.  Allergies: No Known Allergies   (Not in a hospital admission)  No results found for this or any previous visit (from the past 48 hour(s)). No results found.  Review of Systems  Constitutional: Negative.   HENT: Negative.   Eyes: Negative.   Respiratory: Negative.   Cardiovascular: Negative.   Gastrointestinal: Negative.   Genitourinary: Negative.   Musculoskeletal: Negative.   Skin: Negative.   Neurological: Negative.   Endo/Heme/Allergies: Negative.   Psychiatric/Behavioral: Negative.     There were no vitals taken for this visit. Physical Exam   Assessment/Plan Face lift and lower lid blepharoplasty scheduled 04/08/18  Bea Graff, MD 04/05/2018, 2:25 PM

## 2018-04-05 NOTE — H&P (Signed)
Devin Buckley is an 77 y.o. male.   Chief Complaint: facial l  Past Medical History:  Diagnosis Date  . Heart murmur   . History of kidney stones     Past Surgical History:  Procedure Laterality Date  . ACHILLES TENDON REPAIR Left   . arthroscopic shoulder  Right     No family history on file. Social History:  reports that he has never smoked. He has never used smokeless tobacco. He reports that he does not drink alcohol or use drugs.  Allergies: No Known Allergies   (Not in a hospital admission)  No results found for this or any previous visit (from the past 48 hour(s)). No results found.  Review of Systems  Constitutional: Negative.   HENT: Negative.   Eyes: Negative.   Respiratory: Negative.   Cardiovascular: Negative.   Gastrointestinal: Negative.   Genitourinary: Negative.   Musculoskeletal: Negative.   Skin: Negative.   Neurological: Negative.   Endo/Heme/Allergies: Negative.   Psychiatric/Behavioral: Negative.     There were no vitals taken for this visit. Physical Exam   Assessment/Plan Face lift and lower lid blepharoplasty scheduled 04/08/18  Bea Graff, MD 04/05/2018, 2:25 PM

## 2018-04-08 ENCOUNTER — Ambulatory Visit (HOSPITAL_COMMUNITY): Payer: Medicare Other

## 2018-04-08 ENCOUNTER — Ambulatory Visit (HOSPITAL_COMMUNITY)
Admission: RE | Disposition: A | Discharge status: Home / Self Care | Payer: Self-pay | Source: Ambulatory Visit | Attending: Plastic Surgery

## 2018-04-08 ENCOUNTER — Other Ambulatory Visit: Payer: Self-pay

## 2018-04-08 ENCOUNTER — Encounter (HOSPITAL_COMMUNITY): Payer: Self-pay | Admitting: Certified Registered Nurse Anesthetist

## 2018-04-08 ENCOUNTER — Observation Stay (HOSPITAL_COMMUNITY)
Admission: RE | Admit: 2018-04-08 | Discharge: 2018-04-09 | Disposition: A | Discharge status: Home / Self Care | Payer: Medicare Other | Source: Ambulatory Visit | Attending: Plastic Surgery | Admitting: Plastic Surgery

## 2018-04-08 ENCOUNTER — Ambulatory Visit (HOSPITAL_COMMUNITY): Payer: Medicare Other | Admitting: Physician Assistant

## 2018-04-08 DIAGNOSIS — Z411 Encounter for cosmetic surgery: Secondary | ICD-10-CM | POA: Diagnosis not present

## 2018-04-08 DIAGNOSIS — R238 Other skin changes: Secondary | ICD-10-CM | POA: Diagnosis present

## 2018-04-08 HISTORY — PX: BROW LIFT: SHX178

## 2018-04-08 HISTORY — PX: RHYTIDECTOMY: SHX180

## 2018-04-08 SURGERY — RHYTIDECTOMY
Anesthesia: General | Site: Face

## 2018-04-08 MED ORDER — SODIUM CHLORIDE 0.9 % IJ SOLN
INTRAMUSCULAR | Status: DC | PRN
  Administered 2018-04-08 (×7): 10 mL

## 2018-04-08 MED ORDER — OXYCODONE HCL 5 MG/5ML PO SOLN: 5.0000 mg | Freq: Once | ORAL | Status: AC | PRN

## 2018-04-08 MED ORDER — HYDROMORPHONE HCL 1 MG/ML IJ SOLN
0.2500 mg | INTRAMUSCULAR | Status: DC | PRN
  Administered 2018-04-08 (×2): 0.25 mg via INTRAVENOUS
  Administered 2018-04-08: 0.5 mg via INTRAVENOUS

## 2018-04-08 MED ORDER — SODIUM CHLORIDE 0.9 % IV SOLN
INTRAVENOUS | Status: DC | PRN
  Administered 2018-04-08 (×2): via INTRAVENOUS
  Administered 2018-04-08: 40 ug/min via INTRAVENOUS

## 2018-04-08 MED ORDER — ONDANSETRON HCL 4 MG/2ML IJ SOLN
INTRAMUSCULAR | Status: DC | PRN
  Administered 2018-04-08: 4 mg via INTRAVENOUS

## 2018-04-08 MED ORDER — OXYCODONE HCL 5 MG PO TABS
5.0000 mg | ORAL_TABLET | Freq: Once | ORAL | Status: AC | PRN
  Administered 2018-04-08: 5 mg via ORAL

## 2018-04-08 MED ORDER — LIDOCAINE-EPINEPHRINE 1 %-1:100000 IJ SOLN
INTRAMUSCULAR | Status: AC
  Filled 2018-04-08: qty 1

## 2018-04-08 MED ORDER — GLYCOPYRROLATE PF 0.2 MG/ML IJ SOSY
PREFILLED_SYRINGE | INTRAMUSCULAR | Status: DC | PRN
  Administered 2018-04-08 (×3): .1 mg via INTRAVENOUS

## 2018-04-08 MED ORDER — MIDAZOLAM HCL 5 MG/5ML IJ SOLN
INTRAMUSCULAR | Status: DC | PRN
  Administered 2018-04-08: 2 mg via INTRAVENOUS

## 2018-04-08 MED ORDER — THROMBIN (RECOMBINANT) 5000 UNITS EX SOLR
CUTANEOUS | Status: AC
  Filled 2018-04-08: qty 5000

## 2018-04-08 MED ORDER — FENTANYL CITRATE (PF) 250 MCG/5ML IJ SOLN
INTRAMUSCULAR | Status: AC
  Filled 2018-04-08: qty 5

## 2018-04-08 MED ORDER — PROPOFOL 10 MG/ML IV BOLUS
INTRAVENOUS | Status: DC | PRN
  Administered 2018-04-08: 120 mg via INTRAVENOUS

## 2018-04-08 MED ORDER — CLONAZEPAM 0.5 MG PO TABS
0.5000 mg | ORAL_TABLET | Freq: Two times a day (BID) | ORAL | Status: DC
  Administered 2018-04-08: 0.5 mg via ORAL
  Filled 2018-04-08: qty 1

## 2018-04-08 MED ORDER — BUPRENORPHINE HCL-NALOXONE HCL 8-2 MG SL SUBL
1.0000 | SUBLINGUAL_TABLET | Freq: Every day | SUBLINGUAL | Status: DC
  Administered 2018-04-08: 1 via SUBLINGUAL
  Filled 2018-04-08: qty 1

## 2018-04-08 MED ORDER — 0.9 % SODIUM CHLORIDE (POUR BTL) OPTIME
TOPICAL | Status: DC | PRN
  Administered 2018-04-08 (×3): 1000 mL

## 2018-04-08 MED ORDER — OXYCODONE HCL 5 MG PO TABS
ORAL_TABLET | ORAL | Status: AC
  Filled 2018-04-08: qty 1

## 2018-04-08 MED ORDER — ROCURONIUM BROMIDE 10 MG/ML (PF) SYRINGE
PREFILLED_SYRINGE | INTRAVENOUS | Status: DC | PRN
  Administered 2018-04-08: 50 mg via INTRAVENOUS

## 2018-04-08 MED ORDER — LIDOCAINE 2% (20 MG/ML) 5 ML SYRINGE
INTRAMUSCULAR | Status: AC
  Filled 2018-04-08: qty 5

## 2018-04-08 MED ORDER — ONDANSETRON HCL 4 MG PO TABS: 4.0000 mg | ORAL_TABLET | ORAL | Status: DC | PRN

## 2018-04-08 MED ORDER — TESTOSTERONE CYPIONATE 200 MG/ML IM SOLN: 140.0000 mg | INTRAMUSCULAR | Status: DC

## 2018-04-08 MED ORDER — FENTANYL CITRATE (PF) 100 MCG/2ML IJ SOLN
INTRAMUSCULAR | Status: DC | PRN
  Administered 2018-04-08 (×2): 25 ug via INTRAVENOUS
  Administered 2018-04-08: 100 ug via INTRAVENOUS
  Administered 2018-04-08 (×4): 25 ug via INTRAVENOUS

## 2018-04-08 MED ORDER — PHENYLEPHRINE 40 MCG/ML (10ML) SYRINGE FOR IV PUSH (FOR BLOOD PRESSURE SUPPORT)
PREFILLED_SYRINGE | INTRAVENOUS | Status: AC
  Filled 2018-04-08: qty 10

## 2018-04-08 MED ORDER — EPINEPHRINE PF 1 MG/ML IJ SOLN
INTRAMUSCULAR | Status: DC | PRN
  Administered 2018-04-08: 1 mg via SUBCUTANEOUS

## 2018-04-08 MED ORDER — PROMETHAZINE HCL 25 MG/ML IJ SOLN: 6.2500 mg | INTRAMUSCULAR | Status: DC | PRN

## 2018-04-08 MED ORDER — ROCURONIUM BROMIDE 50 MG/5ML IV SOSY
PREFILLED_SYRINGE | INTRAVENOUS | Status: AC
  Filled 2018-04-08: qty 5

## 2018-04-08 MED ORDER — PROSIGHT PO TABS
1.0000 | ORAL_TABLET | Freq: Every day | ORAL | Status: DC
  Administered 2018-04-08: 1 via ORAL
  Filled 2018-04-08: qty 1

## 2018-04-08 MED ORDER — HYDROMORPHONE HCL 1 MG/ML IJ SOLN
INTRAMUSCULAR | Status: AC
  Administered 2018-04-08: 0.5 mg via INTRAVENOUS
  Filled 2018-04-08: qty 1

## 2018-04-08 MED ORDER — GLYCOPYRROLATE PF 0.2 MG/ML IJ SOSY
PREFILLED_SYRINGE | INTRAMUSCULAR | Status: AC
  Filled 2018-04-08: qty 2

## 2018-04-08 MED ORDER — DOCUSATE SODIUM 100 MG PO CAPS: 200.0000 mg | ORAL_CAPSULE | Freq: Every evening | ORAL | Status: DC

## 2018-04-08 MED ORDER — LACTATED RINGERS IV SOLN
INTRAVENOUS | Status: DC | PRN
  Administered 2018-04-08 (×4): via INTRAVENOUS

## 2018-04-08 MED ORDER — BUPRENORPHINE HCL-NALOXONE HCL 2-0.5 MG SL SUBL
2.0000 | SUBLINGUAL_TABLET | Freq: Every day | SUBLINGUAL | Status: DC
  Administered 2018-04-08: 2 via SUBLINGUAL
  Filled 2018-04-08: qty 2

## 2018-04-08 MED ORDER — LIDOCAINE HCL (CARDIAC) PF 100 MG/5ML IV SOSY
PREFILLED_SYRINGE | INTRAVENOUS | Status: DC | PRN
  Administered 2018-04-08: 80 mg via INTRAVENOUS

## 2018-04-08 MED ORDER — LIDOCAINE-EPINEPHRINE 1 %-1:100000 IJ SOLN
INTRAMUSCULAR | Status: AC
  Filled 2018-04-08: qty 2

## 2018-04-08 MED ORDER — SUGAMMADEX SODIUM 200 MG/2ML IV SOLN
INTRAVENOUS | Status: DC | PRN
  Administered 2018-04-08: 200 mg via INTRAVENOUS

## 2018-04-08 MED ORDER — THROMBIN (RECOMBINANT) 20000 UNITS EX SOLR
CUTANEOUS | Status: AC
  Filled 2018-04-08: qty 20000

## 2018-04-08 MED ORDER — ARTIFICIAL TEARS OPHTHALMIC OINT
TOPICAL_OINTMENT | OPHTHALMIC | Status: AC
  Filled 2018-04-08: qty 3.5

## 2018-04-08 MED ORDER — MIDAZOLAM HCL 2 MG/2ML IJ SOLN
INTRAMUSCULAR | Status: AC
  Filled 2018-04-08: qty 2

## 2018-04-08 MED ORDER — LACTATED RINGERS IV SOLN
PREFILLED_SYRINGE | INTRAVENOUS | Status: DC
  Filled 2018-04-08: qty 1000

## 2018-04-08 MED ORDER — THROMBIN 20000 UNITS EX SOLR
CUTANEOUS | Status: DC | PRN
  Administered 2018-04-08: 20000 [IU] via TOPICAL

## 2018-04-08 MED ORDER — CEFAZOLIN SODIUM-DEXTROSE 1-4 GM/50ML-% IV SOLN
INTRAVENOUS | Status: DC | PRN
  Administered 2018-04-08 (×2): 2 g via INTRAVENOUS

## 2018-04-08 MED ORDER — LACTATED RINGERS IR SOLN
Status: DC | PRN
  Administered 2018-04-08: 1000 mL

## 2018-04-08 MED ORDER — PROPOFOL 10 MG/ML IV BOLUS
INTRAVENOUS | Status: AC
  Filled 2018-04-08: qty 20

## 2018-04-08 MED ORDER — ZOLPIDEM TARTRATE 5 MG PO TABS: 5.0000 mg | ORAL_TABLET | Freq: Every evening | ORAL | Status: DC | PRN

## 2018-04-08 MED ORDER — BUPRENORPHINE HCL-NALOXONE HCL 8-2 MG SL SUBL: 1.5000 | SUBLINGUAL_TABLET | Freq: Every evening | SUBLINGUAL | Status: DC

## 2018-04-08 MED ORDER — OCUVITE-LUTEIN PO CAPS
1.0000 | ORAL_CAPSULE | Freq: Every evening | ORAL | Status: DC
  Filled 2018-04-08 (×2): qty 1

## 2018-04-08 MED ORDER — PHENYLEPHRINE 40 MCG/ML (10ML) SYRINGE FOR IV PUSH (FOR BLOOD PRESSURE SUPPORT)
PREFILLED_SYRINGE | INTRAVENOUS | Status: DC | PRN
  Administered 2018-04-08 (×2): 80 ug via INTRAVENOUS
  Administered 2018-04-08: 40 ug via INTRAVENOUS

## 2018-04-08 MED ORDER — ONDANSETRON HCL 4 MG/2ML IJ SOLN: 4.0000 mg | INTRAMUSCULAR | Status: DC | PRN

## 2018-04-08 MED ORDER — HYDROCODONE-ACETAMINOPHEN 7.5-325 MG/15ML PO SOLN
10.0000 mL | ORAL | Status: DC | PRN
  Administered 2018-04-08 – 2018-04-09 (×3): 15 mL via ORAL
  Filled 2018-04-08 (×3): qty 15

## 2018-04-08 MED ORDER — LIDOCAINE-EPINEPHRINE 1 %-1:100000 IJ SOLN
INTRAMUSCULAR | Status: DC | PRN
  Administered 2018-04-08 (×3): 20 mL

## 2018-04-08 MED ORDER — CEFAZOLIN SODIUM-DEXTROSE 2-4 GM/100ML-% IV SOLN
INTRAVENOUS | Status: AC
  Filled 2018-04-08: qty 100

## 2018-04-08 SURGICAL SUPPLY — 58 items
APPLICATOR COTTON TIP 6 STRL (MISCELLANEOUS) ×4 IMPLANT
APPLICATOR COTTON TIP 6IN STRL (MISCELLANEOUS) ×6
APPLICATOR DR MATTHEWS STRL (MISCELLANEOUS) ×3 IMPLANT
BLADE 10 SAFETY STRL DISP (BLADE) IMPLANT
BLADE SURG 15 STRL LF DISP TIS (BLADE) ×4 IMPLANT
BLADE SURG 15 STRL SS (BLADE) ×2
BNDG GAUZE ELAST 4 BULKY (GAUZE/BANDAGES/DRESSINGS) ×3 IMPLANT
CANISTER SUCT 3000ML PPV (MISCELLANEOUS) ×3 IMPLANT
CLEANER TIP ELECTROSURG 2X2 (MISCELLANEOUS) ×3 IMPLANT
COVER SURGICAL LIGHT HANDLE (MISCELLANEOUS) ×3 IMPLANT
COVER WAND RF STERILE (DRAPES) IMPLANT
DRAIN HEMOVAC 7FR (DRAIN) ×6 IMPLANT
ELECT CAUTERY BLADE 6.4 (BLADE) ×3 IMPLANT
ELECT NEEDLE TIP 2.8 STRL (NEEDLE) ×3 IMPLANT
ELECT REM PT RETURN 9FT ADLT (ELECTROSURGICAL) ×3
ELECTRODE REM PT RTRN 9FT ADLT (ELECTROSURGICAL) ×2 IMPLANT
EVACUATOR SILICONE 100CC (DRAIN) ×6 IMPLANT
GAUZE SPONGE 4X4 12PLY STRL (GAUZE/BANDAGES/DRESSINGS) ×3 IMPLANT
GAUZE XEROFORM 1X8 LF (GAUZE/BANDAGES/DRESSINGS) ×9 IMPLANT
GLOVE BIO SURGEON STRL SZ7.5 (GLOVE) ×3 IMPLANT
GLOVE BIOGEL PI IND STRL 7.5 (GLOVE) ×2 IMPLANT
GLOVE BIOGEL PI IND STRL 8 (GLOVE) IMPLANT
GLOVE BIOGEL PI INDICATOR 7.5 (GLOVE) ×1
GLOVE BIOGEL PI INDICATOR 8 (GLOVE)
GOWN STRL REUS W/ TWL LRG LVL3 (GOWN DISPOSABLE) IMPLANT
GOWN STRL REUS W/ TWL XL LVL3 (GOWN DISPOSABLE) ×4 IMPLANT
GOWN STRL REUS W/TWL LRG LVL3 (GOWN DISPOSABLE)
GOWN STRL REUS W/TWL XL LVL3 (GOWN DISPOSABLE) ×2
KIT BASIN OR (CUSTOM PROCEDURE TRAY) ×3 IMPLANT
KIT TURNOVER KIT B (KITS) ×3 IMPLANT
MARKER SKIN DUAL TIP RULER LAB (MISCELLANEOUS) ×3 IMPLANT
NEEDLE HYPO 25GX1X1/2 BEV (NEEDLE) ×3 IMPLANT
NEEDLE HYPO 30X.5 LL (NEEDLE) ×3 IMPLANT
NEEDLE SPNL 22GX3.5 QUINCKE BK (NEEDLE) ×3 IMPLANT
NS IRRIG 1000ML POUR BTL (IV SOLUTION) ×3 IMPLANT
PAD ARMBOARD 7.5X6 YLW CONV (MISCELLANEOUS) ×6 IMPLANT
PENCIL BUTTON HOLSTER BLD 10FT (ELECTRODE) ×3 IMPLANT
SPONGE LAP 18X18 X RAY DECT (DISPOSABLE) ×3 IMPLANT
STAPLER VISISTAT 35W (STAPLE) ×3 IMPLANT
STRIP CLOSURE SKIN 1/2X4 (GAUZE/BANDAGES/DRESSINGS) ×3 IMPLANT
SUT MNCRL AB 3-0 PS2 18 (SUTURE) ×6 IMPLANT
SUT MON AB 5-0 P3 18 (SUTURE) ×15 IMPLANT
SUT NOVAFIL 5 0 BLK 18 IN P13 (SUTURE) ×9 IMPLANT
SUT PLAIN 6 0 TG1408 (SUTURE) ×3 IMPLANT
SUT PROLENE 3 0 PS 2 (SUTURE) ×15 IMPLANT
SUT PROLENE 6 0 P 1 18 (SUTURE) IMPLANT
SYR 50ML SLIP (SYRINGE) ×3 IMPLANT
SYR CONTROL 10ML LL (SYRINGE) ×3 IMPLANT
TOWEL OR 17X24 6PK STRL BLUE (TOWEL DISPOSABLE) ×3 IMPLANT
TOWEL OR 17X26 10 PK STRL BLUE (TOWEL DISPOSABLE) ×3 IMPLANT
TRAY ENT MC OR (CUSTOM PROCEDURE TRAY) ×3 IMPLANT
TRAY FOLEY W/BAG SLVR 14FR (SET/KITS/TRAYS/PACK) ×3 IMPLANT
TUBE CONNECTING 12X1/4 (SUCTIONS) ×3 IMPLANT
TUBING AUTOFUSE DISPOSABLE (MISCELLANEOUS) ×3 IMPLANT
TUBING SET GRADUATE ASPIR 12FT (MISCELLANEOUS) ×3 IMPLANT
WATER STERILE IRR 1000ML POUR (IV SOLUTION) ×3 IMPLANT
WIPE INSTRUMENT ADHESIVE BACK (MISCELLANEOUS) ×3 IMPLANT
YANKAUER SUCT BULB TIP NO VENT (SUCTIONS) ×3 IMPLANT

## 2018-04-08 NOTE — Transfer of Care (Signed)
Immediate Anesthesia Transfer of Care Note  Patient: Devin Buckley  Procedure(s) Performed: RHYTIDECTOMY (N/A Face) BLEPHAROPLASTY BILATERAL LOWER LID (Bilateral Eye)  Patient Location: PACU  Anesthesia Type:General  Level of Consciousness: awake and patient cooperative  Airway & Oxygen Therapy: Patient Spontanous Breathing and Patient connected to face mask oxygen  Post-op Assessment: Report given to RN and Post -op Vital signs reviewed and stable  Post vital signs: Reviewed and stable  Last Vitals:  Vitals Value Taken Time  BP 130/90 04/08/2018  3:43 PM  Temp    Pulse 101 04/08/2018  3:46 PM  Resp 20 04/08/2018  3:46 PM  SpO2 98 % 04/08/2018  3:46 PM  Vitals shown include unvalidated device data.  Last Pain:  Vitals:   04/08/18 0700  TempSrc:   PainSc: 0-No pain         Complications: No apparent anesthesia complications

## 2018-04-08 NOTE — Anesthesia Postprocedure Evaluation (Signed)
Anesthesia Post Note  Patient: Devin Buckley  Procedure(s) Performed: RHYTIDECTOMY (N/A Face) BLEPHAROPLASTY BILATERAL LOWER LID (Bilateral Eye)     Patient location during evaluation: PACU Anesthesia Type: General Level of consciousness: awake and alert Pain management: pain level controlled Vital Signs Assessment: post-procedure vital signs reviewed and stable Respiratory status: spontaneous breathing, nonlabored ventilation, respiratory function stable and patient connected to nasal cannula oxygen Cardiovascular status: blood pressure returned to baseline and stable Postop Assessment: no apparent nausea or vomiting Anesthetic complications: no    Last Vitals:  Vitals:   04/08/18 1643 04/08/18 1656  BP: 105/72 113/77  Pulse: 87 85  Resp: 20 18  Temp: 36.7 C 37.2 C  SpO2: 92% 96%    Last Pain:  Vitals:   04/08/18 1656  TempSrc: Oral  PainSc:                  Kyira Volkert L Jhamal Plucinski

## 2018-04-08 NOTE — Progress Notes (Signed)
Left message with answering service for pain med Md to call and give orders

## 2018-04-08 NOTE — Op Note (Signed)
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  Postoperative diagnosis same.  Procedure bilateral lower lid blepharoplasty and facelift with neck plication  Estimated blood loss Per anesthesia record  Anesthesia General  Complications none  Drains a Penrose drain was placed into each side of neck  Assistant none  Technique: The patient was taken to the operating room and general endotracheal anesthesia was obtained and sterile prep and drape performed.  Local anesthesia using 1% Xylocaine with epinephrine was injected into the lower lids bilaterally.  Lower lid blepharoplasty was performed dissecting the skin flap from the orbicularis muscle using sharp dissection.  The orbital septum was exposed using the curved scissors to split the muscle against the infraorbital rim.  Excess infraorbital fat was removed and cautery used for visible vessels at this position.  The lower lid skin was draped superiorly and the excess identified.  The skin was trimmed and positioned for closure.  Interrupted sutures of 6-0 plain gut were used in the lateral closure and a swaddled suture of 5-0 Novafil was used for the anterior closure bilaterally.  Wet gauze were placed on the eyes bilaterally.  The facelift procedure was initiated with injection of lactated Ringer's and epinephrine into the subcutaneous position starting the left side initially.  Additional injection with 1% lidocaine with epinephrine diluted 50-50 with injectable saline was also used.  Bleeding was controlled with electrocautery and topical thrombin on the left side and right sides.  A submental incision was made with a small elliptical skin excision and plication of the anterior platysma was performed with 3-0 Monocryl sutures.  The skin of the face neck was elevated with a facelift scissors.  The lateral plication of the platysma to the sternocleidomastoid fascia was performed with 3-0 Monocryl sutures.  Suspension of the SMAS  was performed with 3-0 Monocryl in looping incisions to the angle of the jaw attached to the temporal fascia and to the mid cheek attached to the temporal fascia.  A swing suspension suture of 3-0 Novafil was anchored to the left mastoid periosteum and passed anteriorly to the angle of the neck.  This was attached to a secondary suture to be used on the right side.  The skin was redraped and marked for excision and excess skin removed on the left side.  Suture closure was performed with buried 5-0 Monocryl and surface 5-0 Novafil sutures.  Prior to closure of the neck a JP drain was placed.  The JP drain was secured with a single suture to the skin surface.  Postauricular staples were used for surface closure.  The right side was then injected and the skin flaps elevated.  Lateral plication of the platysma on the right was performed and suspensory sutures of this mass also performed as on the left.  The sling suture initially placed on the left was then connected to the periosteum of the right mastoid and tightened.  The excess skin was draped and marked and excised.  Closure was then performed using buried 5-0 Monocryl and surface running sutures of 5-0 Novafil and postauricular staples.  Prior to closure on the right a second JP drain was placed into the right neck area.  Closure of the submental incision was performed with buried 5-0 Monocryl and running intradermal 5-0 Monocryl and surface Steri-Strips.  Sterile dressings were applied to all areas and a turban of gauze placed.  Patient tolerated the procedure well sent recovery in stable condition.

## 2018-04-08 NOTE — Anesthesia Preprocedure Evaluation (Addendum)
Anesthesia Evaluation  Patient identified by MRN, date of birth, ID band Patient awake    Reviewed: Allergy & Precautions, NPO status , Patient's Chart, lab work & pertinent test results  Airway Mallampati: II  TM Distance: >3 FB Neck ROM: Full    Dental no notable dental hx. (+) Teeth Intact, Dental Advisory Given, Implants   Pulmonary neg pulmonary ROS,    Pulmonary exam normal breath sounds clear to auscultation       Cardiovascular Normal cardiovascular exam+ Valvular Problems/Murmurs  Rhythm:Regular Rate:Normal     Neuro/Psych negative neurological ROS  negative psych ROS   GI/Hepatic negative GI ROS, Neg liver ROS,   Endo/Other  negative endocrine ROS  Renal/GU negative Renal ROS  negative genitourinary   Musculoskeletal negative musculoskeletal ROS (+)   Abdominal   Peds negative pediatric ROS (+)  Hematology negative hematology ROS (+)   Anesthesia Other Findings   Reproductive/Obstetrics negative OB ROS                            Anesthesia Physical Anesthesia Plan  ASA: III  Anesthesia Plan: General   Post-op Pain Management:    Induction: Intravenous  PONV Risk Score and Plan: 2 and Ondansetron and Midazolam  Airway Management Planned: Oral ETT  Additional Equipment:   Intra-op Plan:   Post-operative Plan: Extubation in OR  Informed Consent: I have reviewed the patients History and Physical, chart, labs and discussed the procedure including the risks, benefits and alternatives for the proposed anesthesia with the patient or authorized representative who has indicated his/her understanding and acceptance.   Dental advisory given  Plan Discussed with: CRNA, Anesthesiologist and Surgeon  Anesthesia Plan Comments:       Anesthesia Quick Evaluation

## 2018-04-08 NOTE — Anesthesia Procedure Notes (Addendum)
Procedure Name: Intubation Date/Time: 04/08/2018 7:42 AM Performed by: Carney Living, CRNA Pre-anesthesia Checklist: Patient identified, Emergency Drugs available, Suction available and Patient being monitored Patient Re-evaluated:Patient Re-evaluated prior to induction Oxygen Delivery Method: Circle System Utilized Preoxygenation: Pre-oxygenation with 100% oxygen Induction Type: IV induction Ventilation: Mask ventilation without difficulty Laryngoscope Size: Mac and 4 Grade View: Grade II Tube type: Oral Number of attempts: 3 Airway Equipment and Method: Stylet Placement Confirmation: ETT inserted through vocal cords under direct vision,  positive ETCO2 and breath sounds checked- equal and bilateral Secured at: 22 cm Tube secured with: Tape Dental Injury: Teeth and Oropharynx as per pre-operative assessment  Comments: 2 attempts by SRNA, 1 attempt by CRNA

## 2018-04-08 NOTE — Interval H&P Note (Signed)
History and Physical Interval Note:  04/08/2018 7:44 AM  Devin Buckley  has presented today for surgery, with the diagnosis of COSMETIC SURGERY, FACELIFT  The various methods of treatment have been discussed with the patient and family. After consideration of risks, benefits and other options for treatment, the patient has consented to  Procedure(s): RHYTIDECTOMY (N/A) BLEPHAROPLASTY BILATERAL LOWER LID (Bilateral) as a surgical intervention .  The patient's history has been reviewed, patient examined, no change in status, stable for surgery.  I have reviewed the patient's chart and labs.  Questions were answered to the patient's satisfaction.   H &P is up to date ,no changes.04-08-18  Bea Graff

## 2018-04-09 ENCOUNTER — Encounter (HOSPITAL_COMMUNITY): Payer: Self-pay | Admitting: Plastic Surgery

## 2018-04-09 DIAGNOSIS — Z411 Encounter for cosmetic surgery: Secondary | ICD-10-CM | POA: Diagnosis not present

## 2018-04-09 MED FILL — Thrombin (Recombinant) For Soln 20000 Unit: CUTANEOUS | Qty: 1 | Status: AC

## 2018-04-09 NOTE — Discharge Summary (Signed)
Physician Discharge Summary  Patient ID: Devin Buckley MRN: [id number] DOB/AGE: 1941-04-09 77 y.o.  Admit date: 04/08/2018 Discharge date: 04/09/2018  Admission Diagnoses:  Discharge Diagnoses:  Active Problems:   Facial aging   Discharged Condition: good  Hospital Course: patient had surgery, no piost op problems.  Consults: None  Significant Diagnostic Studies: none  Treatments: surgery: as above  Discharge Exam: Blood pressure 111/72, pulse 64, temperature 98.2 F (36.8 C), temperature source Oral, resp. rate 16, SpO2 97 %. General appearance: alert and cooperative Resp: clear to auscultation bilaterally Cardio: regular rate and rhythm GI: soft, non-tender; bowel sounds normal; no masses,  no organomegaly  Disposition: Discharge disposition: 01-Home or Self Care       Discharge Instructions    Call MD for:  difficulty breathing, headache or visual disturbances   Complete by:  As directed    Call MD for:  extreme fatigue   Complete by:  As directed    Call MD for:  hives   Complete by:  As directed    Call MD for:  persistant dizziness or light-headedness   Complete by:  As directed    Call MD for:  persistant nausea and vomiting   Complete by:  As directed    Call MD for:  redness, tenderness, or signs of infection (pain, swelling, redness, odor or green/yellow discharge around incision site)   Complete by:  As directed    Call MD for:  severe uncontrolled pain   Complete by:  As directed    Call MD for:  temperature >100.4   Complete by:  As directed    Diet - low sodium heart healthy   Complete by:  As directed    Discharge instructions   Complete by:  As directed    Patient has been given instructions   Increase activity slowly   Complete by:  As directed    No wound care   Complete by:  As directed      Allergies as of 04/09/2018   No Known Allergies     Medication List    TAKE these medications   buprenorphine-naloxone 8-2 mg Subl SL  tablet Commonly known as:  SUBOXONE Place 1.5 tablets under the tongue every evening.   clonazePAM 0.5 MG tablet Commonly known as:  KLONOPIN Take 0.5 mg by mouth 2 (two) times daily.   finasteride 1 MG tablet Commonly known as:  PROPECIA Take 1 tablet (1 mg total) by mouth daily.   ICAPS AREDS 2 Caps Take 2 tablets by mouth every evening.   STOOL SOFTENER PO Take 2-4 tablets by mouth every evening.   testosterone cypionate 200 MG/ML injection Commonly known as:  DEPOTESTOSTERONE CYPIONATE Inject 0.7 mLs (140 mg total) into the muscle every 14 (fourteen) days.        SignedBea Graff 04/09/2018, 8:20 AM

## 2018-05-09 ENCOUNTER — Telehealth: Payer: Self-pay

## 2018-05-09 NOTE — Telephone Encounter (Signed)
Prior authorization initiated for testosterone via covermymeds.  Key [id number].

## 2018-05-10 ENCOUNTER — Telehealth: Payer: Self-pay | Admitting: Urology

## 2018-05-10 NOTE — Telephone Encounter (Signed)
Pt called asking about PA for testosterone again.  I saw note in chart, but not sure what this means.  Can someone please call pt and explain [phone]

## 2018-05-13 NOTE — Telephone Encounter (Signed)
Left vm to call back, checked Cover My Meds Testosterone was approved with  Dates 05/09/18-05/10/19   Key: [id number]

## 2018-05-14 ENCOUNTER — Telehealth: Payer: Self-pay | Admitting: Urology

## 2018-05-14 NOTE — Telephone Encounter (Signed)
Pt called office and I informed him to call pharmacy about testosterone approval.

## 2018-05-15 NOTE — Telephone Encounter (Signed)
See phone note 05/10/18

## 2018-07-09 ENCOUNTER — Telehealth: Payer: Self-pay

## 2018-07-09 MED ORDER — TESTOSTERONE CYPIONATE 200 MG/ML IM SOLN: 140.0000 mg | INTRAMUSCULAR | 0 refills | Status: DC

## 2018-07-09 NOTE — Telephone Encounter (Signed)
He has not been seen since March 2019.  Needs a follow-up appointment this March.  Rx was sent

## 2018-07-09 NOTE — Telephone Encounter (Signed)
Refill request from [facility] for Testoterone cypionate 200/mg/ml.  Inject 0.59ml IM every 14 days.

## 2018-07-09 NOTE — Telephone Encounter (Signed)
Left message advising patient he needs to schedule an appointment for march per Dr Bernardo Heater.

## 2018-08-16 ENCOUNTER — Other Ambulatory Visit: Payer: Self-pay

## 2018-08-16 DIAGNOSIS — E291 Testicular hypofunction: Secondary | ICD-10-CM

## 2018-08-19 ENCOUNTER — Other Ambulatory Visit: Payer: Medicare Other

## 2018-08-19 DIAGNOSIS — E291 Testicular hypofunction: Secondary | ICD-10-CM

## 2018-08-20 ENCOUNTER — Telehealth: Payer: Self-pay

## 2018-08-20 LAB — TESTOSTERONE: Testosterone: 388 ng/dL (ref 264–916)

## 2018-08-20 LAB — HEMOGLOBIN AND HEMATOCRIT, BLOOD
Hematocrit: 49 % (ref 37.5–51.0)
Hemoglobin: 16.7 g/dL (ref 13.0–17.7)

## 2018-08-21 NOTE — Telephone Encounter (Signed)
Spoke with patient. He had to change appt from 3/6 to 3/19. Next injection due 3/16. Requesting limited Rx to get him through until appt on 3/19. Sent to Dr Bernardo Heater

## 2018-08-21 NOTE — Telephone Encounter (Signed)
Left message for patient to return call to office. 

## 2018-08-22 MED ORDER — TESTOSTERONE CYPIONATE 200 MG/ML IM SOLN: 140.0000 mg | INTRAMUSCULAR | 0 refills | Status: DC

## 2018-08-22 NOTE — Telephone Encounter (Signed)
rx sent

## 2018-08-23 ENCOUNTER — Ambulatory Visit: Payer: Medicare Other | Admitting: Urology

## 2018-08-26 ENCOUNTER — Other Ambulatory Visit: Payer: Self-pay | Admitting: Family Medicine

## 2018-08-26 NOTE — Telephone Encounter (Signed)
Patient needs testosterone [facility]

## 2018-08-27 MED ORDER — TESTOSTERONE CYPIONATE 200 MG/ML IM SOLN: 140.0000 mg | INTRAMUSCULAR | 0 refills | Status: DC

## 2018-09-05 ENCOUNTER — Encounter

## 2018-09-05 ENCOUNTER — Ambulatory Visit (INDEPENDENT_AMBULATORY_CARE_PROVIDER_SITE_OTHER): Payer: Medicare Other | Admitting: Urology

## 2018-09-05 ENCOUNTER — Encounter: Payer: Self-pay | Admitting: Urology

## 2018-09-05 ENCOUNTER — Other Ambulatory Visit: Payer: Self-pay

## 2018-09-05 VITALS — BP 131/66 | HR 74 | Ht 73.98 in | Wt 221.6 lb

## 2018-09-05 DIAGNOSIS — N5201 Erectile dysfunction due to arterial insufficiency: Secondary | ICD-10-CM

## 2018-09-05 DIAGNOSIS — E291 Testicular hypofunction: Secondary | ICD-10-CM

## 2018-09-05 DIAGNOSIS — N200 Calculus of kidney: Secondary | ICD-10-CM | POA: Diagnosis not present

## 2018-09-05 MED ORDER — TESTOSTERONE CYPIONATE 200 MG/ML IM SOLN: 200.0000 mg | INTRAMUSCULAR | 0 refills | Status: DC

## 2018-09-05 NOTE — Patient Instructions (Signed)
Testosterone Replacement Therapy  Testosterone replacement therapy (TRT) is used to treat men who have a low testosterone level (hypogonadism). Testosterone is a male hormone that is produced in the testicles. It is responsible for typically male characteristics and for maintaining a man's sex drive and the ability to get an erection. Testosterone also supports bone and muscle health. TRT can be a gel, liquid, or patch that you put on your skin. It can also be in the form of a tablet or an injection. In some cases, your health care provider may insert long-acting pellets under your skin. In most men, the level of testosterone starts to decline gradually after age 58. Low testosterone can also be caused by certain medical conditions, medicines, and obesity. Your health care provider can diagnose hypogonadism with at least two blood tests that are done early in the morning. Low testosterone may not need to be treated. TRT is usually a choice that you make with your health care provider. Your health care provider may recommend TRT if you have low testosterone that is causing symptoms, such as:  Low sex drive.  Erection problems.  Breast enlargement.  Loss of body hair.  Weak muscles or bones.  Shrinking testicles.  Increased body fat.  Low energy.  Hot flashes.  Depression.  Decreased work Systems analyst. TRT is a lifetime treatment. If you stop treatment, your testosterone will drop, and your symptoms may return. What are the risks? Testosterone replacement therapy may have side effects, including:  Lower sperm count.  Skin irritation at the application or injection site.  Mouth irritation if you take an oral tablet.  Acne.  Swelling of your legs or feet.  Tender breasts.  Dizziness.  Sleep disturbance.  Mood swings.  Possible increased risk of stroke or heart attack. Testosterone replacement therapy may also increase your risk for prostate cancer or male breast cancer.  You should not use TRT if you have either of those conditions. Your health care provider also may not recommend TRT if:  You are suspected of having prostate cancer.  You want to father a child.  You have a high number of red blood cells.  You have untreated sleep apnea.  You have a very large prostate. Supplies needed:  Your health care provider will prescribe the testosterone gel, solution, or medicine that you need. If your health care provider teaches you to do self-injections at home, you will also need: ? Your medicine vial. ? Disposable needles and syringes. ? Alcohol swabs. ? A needle disposal container. ? Adhesive bandages. How to use testosterone replacement therapy Your health care provider will help you find the TRT option that will work best for you based on your preference, the side effects, and the cost. You may:  Rub testosterone gel on your upper arm or shoulder every day after a shower. This is the most common type of TRT. Do not let women or children come in contact with the gel.  Apply a testosterone solution under your arms once each day.  Place a testosterone patch on your skin once each day.  Dissolve a testosterone tablet in your mouth twice each day.  Have a testosterone pellet inserted under your skin by your health care provider. This will be replaced every 3-6 months.  Use testosterone nasal spray three times each day.  Get testosterone injections. For some types of testosterone, your health care provider will give you this injection. With other types of testosterone, you may be taught to give injections to  provider. This will be replaced every 3-6 months.  · Use testosterone nasal spray three times each day.  · Get testosterone injections. For some types of testosterone, your health care provider will give you this injection. With other types of testosterone, you may be taught to give injections to yourself. The frequency of injections may vary based on the type of testosterone that you receive.  Follow these instructions at home:  · Take over-the-counter and prescription medicines only as told by your health care provider.  · Lose weight if you are overweight. Ask your health care provider to help you start a healthy diet and exercise program to  reach and maintain a healthy weight.  · Work with your health care provider to treat other medical conditions that may lower your testosterone. These include obesity, high blood pressure, high cholesterol, diabetes, liver disease, kidney disease, and sleep apnea.  · Keep all follow-up visits as told by your health care provider. This is important.  General recommendations  · Discuss all risks and benefits with your health care provider before starting therapy.  · Work with your health care provider to check your prostate health and do blood testing before you start therapy.  · Do not use any testosterone replacement therapies that are not prescribed by your health care provider or not approved for use in the [place]  · Do not use TRT for bodybuilding or to improve sexual performance. TRT should be used only to treat symptoms of low testosterone.  · Return for all repeat prostate checks and blood tests during therapy, as told by your health care provider.  Where to find more information  Learn more about testosterone replacement therapy from:  · American [employer]: www.urologyhealth.org/urologic-conditions/low-testosterone-(hypogonadism)  · Endocrine Society: www.hormone.org/diseases-and-conditions/mens-health/hypogonadism  Contact a health care provider if:  · You have side effects from your testosterone replacement therapy.  · You continue to have symptoms of low testosterone during treatment.  · You develop new symptoms during treatment.  Summary  · Testosterone replacement therapy is only for men who have low testosterone as determined by blood testing and who have symptoms of low testosterone.  · Testosterone replacement therapy should be prescribed only by a health care provider and should be used under the supervision of a health care provider.  · You may not be able to take testosterone if you have certain medical conditions, including prostate cancer, male breast cancer, or heart  disease.  · Testosterone replacement therapy may have side effects and may make some medical conditions worse.  · Talk with your health care provider about all the risks and benefits before you start therapy.  This information is not intended to replace advice given to you by your health care provider. Make sure you discuss any questions you have with your health care provider.  Document Released: 02/24/2016 Document Revised: 02/24/2016 Document Reviewed: 02/24/2016  [employer]vier Interactive Patient Education © 2019 [employer]

## 2018-09-05 NOTE — Progress Notes (Signed)
09/05/2018 10:55 AM   Devin Buckley [birth year] [id number]  Referring provider: Rusty Aus, MD [place] [facility] [place], [place] [address]  Chief Complaint  Patient presents with  . Hypogonadism   Urologic history: 1.  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  Nephrolithiasis  -12 mm nonobstructing right renal calculus  3.  Erectile dysfunction  -On sildenafil  4.  Renal cyst  -Bilateral simple renal cysts; largest on right measuring 8 cm; asymptomatic   HPI: 78 year old male presents for annual follow-up of hypogonadism.  He is currently injecting 140 mg every 2 weeks.  A testosterone level drawn on 08/19/2018 was 388 ng/dL which was a trough level.  He does have some tiredness and fatigue in his second week and is inquiring if the dose can be increased.  He denies bothersome lower urinary tract symptoms, breast tenderness/enlargement or lower extremity edema.  A hematocrit on 08/19/2018 was 49.  PSA in September 2019 was stable at 0.84.  Denies dysuria, gross hematuria or flank/abdominal/pelvic/scrotal pain.  PMH: Past Medical History:  Diagnosis Date  . ED (erectile dysfunction)   . Heart murmur   . History of kidney stones   . Hormone disorder     Surgical History: Past Surgical History:  Procedure Laterality Date  . ACHILLES TENDON REPAIR Left   . arthroscopic shoulder  Right   . BROW LIFT Bilateral 04/08/2018   Procedure: BLEPHAROPLASTY BILATERAL LOWER LID;  Surgeon: Bea Graff, MD;  Location: [place];  Service: Plastics;  Laterality: Bilateral;  . RHYTIDECTOMY N/A 04/08/2018   Procedure: RHYTIDECTOMY;  Surgeon: Bea Graff, MD;  Location: [place];  Service: Plastics;  Laterality: N/A;    Home Medications:  Allergies as of 09/05/2018   No Known Allergies     Medication List       Accurate as of September 05, 2018 10:55 AM. Always use your most recent med list.        buprenorphine-naloxone 8-2 mg Subl SL  tablet Commonly known as:  SUBOXONE Place 1.5 tablets under the tongue every evening.   clonazePAM 0.5 MG tablet Commonly known as:  KLONOPIN Take 0.5 mg by mouth 2 (two) times daily.   ICaps Areds 2 Caps Take 2 tablets by mouth every evening.   sildenafil 100 MG tablet Commonly known as:  VIAGRA TAKE ONE TABLET BY MOUTH EVERY DAY AS NEEDED FOR ERECTILE DYSFUNCTION   testosterone cypionate 200 MG/ML injection Commonly known as:  DEPOTESTOSTERONE CYPIONATE Inject 0.7 mLs (140 mg total) into the muscle every 14 (fourteen) days.       Allergies: No Known Allergies  Family History: No family history on file.  Social History:  reports that he has never smoked. He has never used smokeless tobacco. He reports that he does not drink alcohol or use drugs.  ROS: UROLOGY Frequent Urination?: No Hard to postpone urination?: No Burning/pain with urination?: No Get up at night to urinate?: No Leakage of urine?: No Urine stream starts and stops?: No Trouble starting stream?: No Do you have to strain to urinate?: No Blood in urine?: No Urinary tract infection?: No Sexually transmitted disease?: No Injury to kidneys or bladder?: No Painful intercourse?: No Weak stream?: No Erection problems?: Yes Penile pain?: No  Gastrointestinal Nausea?: No Vomiting?: No Indigestion/heartburn?: No Diarrhea?: No Constipation?: No  Constitutional Fever: No Night sweats?: No Weight loss?: No Fatigue?: No  Skin Skin rash/lesions?: No Itching?: No  Eyes Blurred vision?: No Double vision?:  No  Ears/Nose/Throat Sore throat?: No Sinus problems?: No  Hematologic/Lymphatic Swollen glands?: No Easy bruising?: No  Cardiovascular Leg swelling?: No Chest pain?: No  Respiratory Cough?: No Shortness of breath?: No  Endocrine Excessive thirst?: No  Musculoskeletal Back pain?: Yes Joint pain?: No  Neurological Headaches?: No Dizziness?: No  Psychologic Depression?: No  Anxiety?: No  Physical Exam: BP 131/66 (BP Location: Left Arm, Patient Position: Sitting, Cuff Size: Normal)   Pulse 74   Ht 6' 1.98" (1.879 m)   Wt 221 lb 9.6 oz (100.5 kg)   BMI 28.47 kg/m   Constitutional:  Alert and oriented, No acute distress. HEENT: [place] AT, moist mucus membranes.  Trachea midline, no masses. Cardiovascular: No clubbing, cyanosis, or edema. Respiratory: Normal respiratory effort, no increased work of breathing. GI: Abdomen is soft, nontender, nondistended, no abdominal masses GU: No CVA tenderness.  Prostate 40 g, smooth without nodules/induration Lymph: No cervical or inguinal lymphadenopathy. Skin: No rashes, bruises or suspicious lesions. Neurologic: Grossly intact, no focal deficits, moving all 4 extremities. Psychiatric: Normal mood and affect.  Laboratory Data  Lab Results  Component Value Date   TESTOSTERONE 388 08/19/2018    Assessment & Plan:   78 year old male with hypogonadism on TRT.  Will increase dose to 200 mg every 2 weeks.  Repeat trough testosterone level in 8 weeks.  Continue annual follow-up and he is scheduled for a physical with his PCP in September 2020 and will have PSA and testosterone drawn at that time.  Return in about 1 year (around 09/05/2019) for Recheck.   Abbie Sons, [place] [address] [place], [place] [address] [phone]

## 2018-10-31 ENCOUNTER — Other Ambulatory Visit: Payer: Medicare Other

## 2018-11-04 ENCOUNTER — Other Ambulatory Visit: Payer: Self-pay

## 2018-11-04 ENCOUNTER — Other Ambulatory Visit: Payer: Medicare Other

## 2018-11-04 DIAGNOSIS — E291 Testicular hypofunction: Secondary | ICD-10-CM

## 2018-11-05 ENCOUNTER — Telehealth: Payer: Self-pay

## 2018-11-05 LAB — TESTOSTERONE: Testosterone: 315 ng/dL (ref 264–916)

## 2018-11-05 NOTE — Telephone Encounter (Signed)
Mychart notification sent 

## 2018-11-05 NOTE — Telephone Encounter (Signed)
-----   Message from Abbie Sons, MD sent at 11/05/2018  8:19 AM EDT ----- Trough testosterone level was 315.  Continue present dose

## 2018-12-31 ENCOUNTER — Other Ambulatory Visit: Payer: Self-pay | Admitting: Family Medicine

## 2018-12-31 MED ORDER — TESTOSTERONE CYPIONATE 200 MG/ML IM SOLN: 200.0000 mg | INTRAMUSCULAR | 0 refills | Status: DC

## 2019-04-02 IMAGING — US US RENAL
1 series · 14 of 25 positions shown · non-contrast
Comparison: 11/18/2013 CT.

CLINICAL DATA: 76-year-old male with flank pain. History kidney
stones. Initial encounter.

EXAM:
RENAL / URINARY TRACT ULTRASOUND COMPLETE

[Series 1: us renal · 0.28mm/px · 14 of 45 slices shown]
[im 1/45]
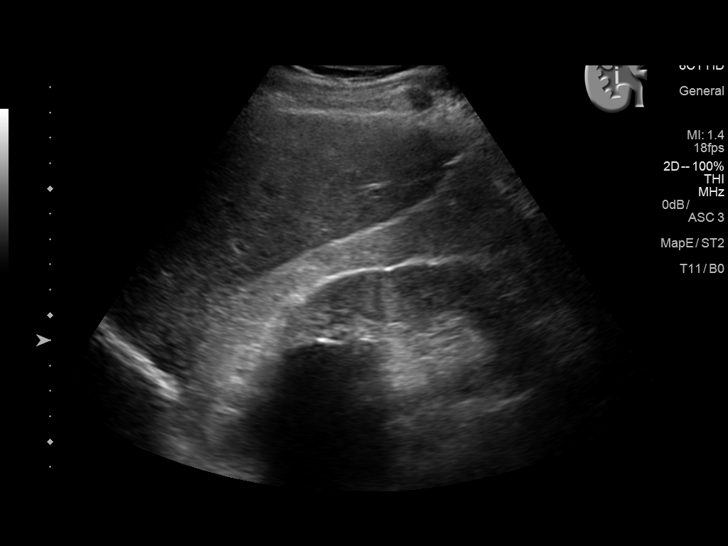
[im 4/45]
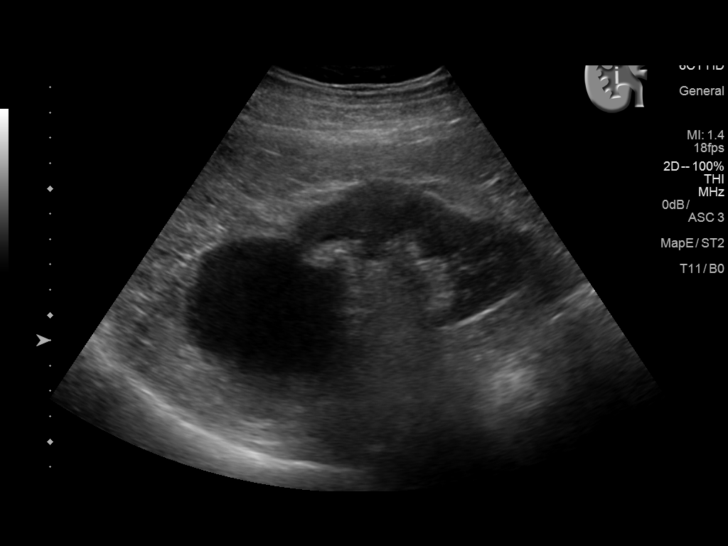
[im 8/45]
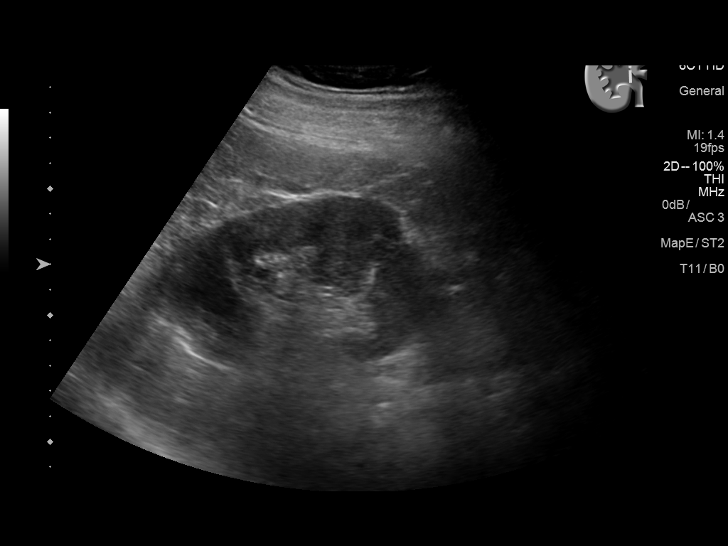
[im 12/45]
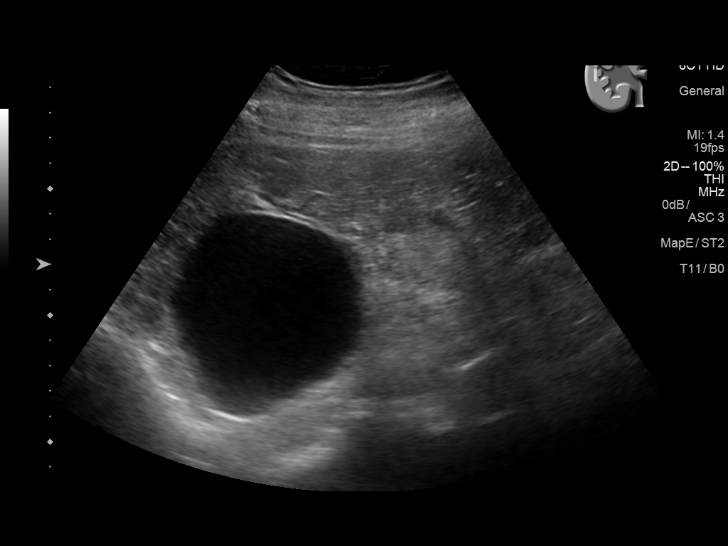
[im 15/45]
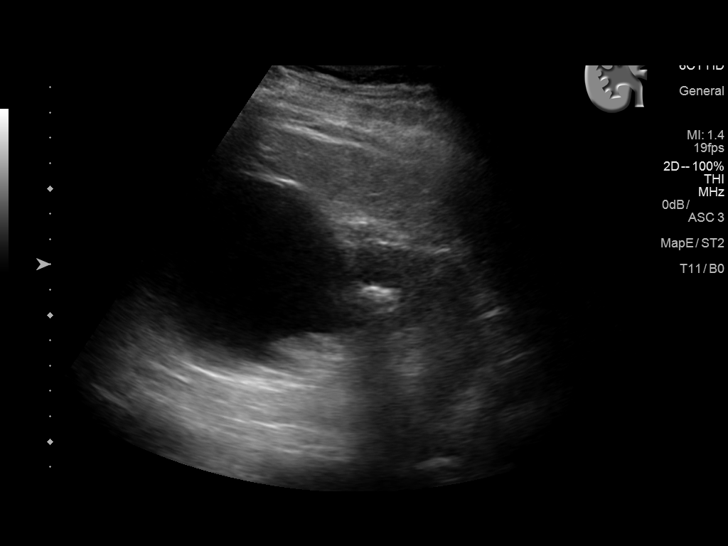
[im 17/45]
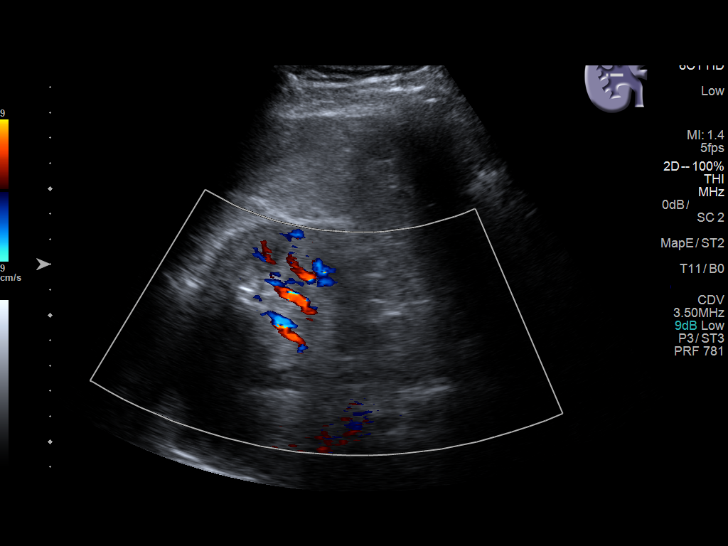
[im 21/45]
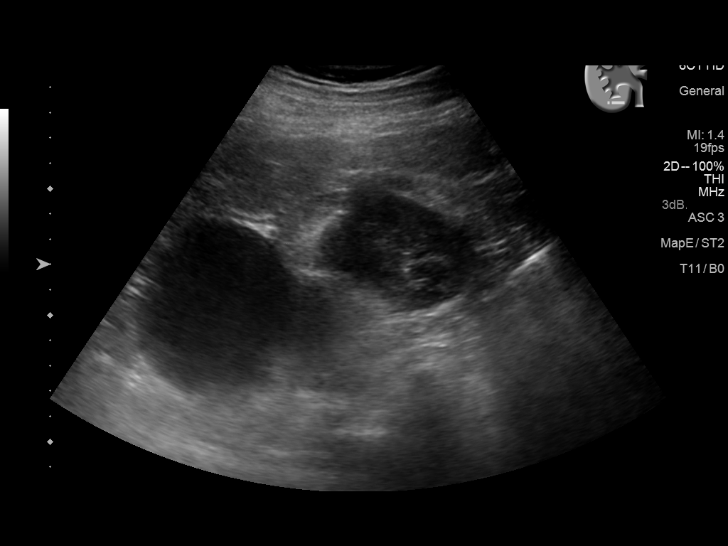
[im 24/45]
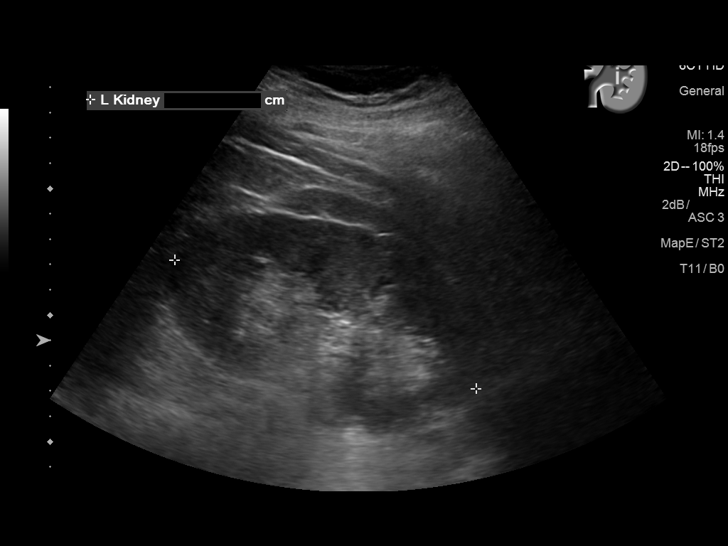
[im 28/45]
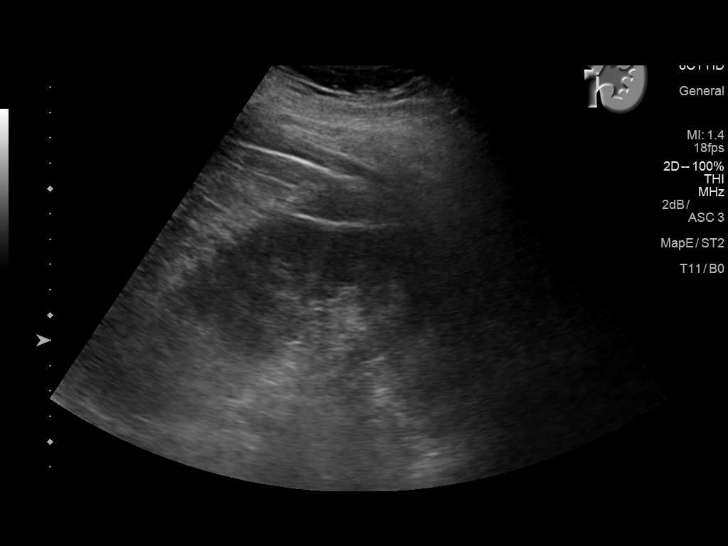
[im 30/45]
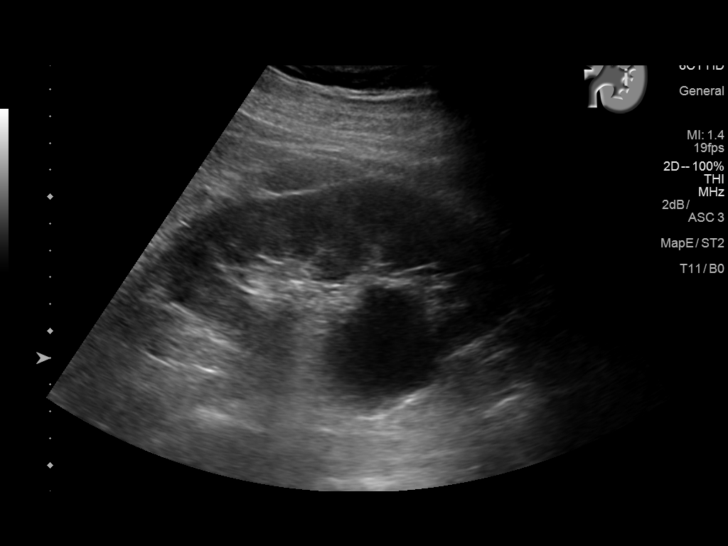
[im 34/45]
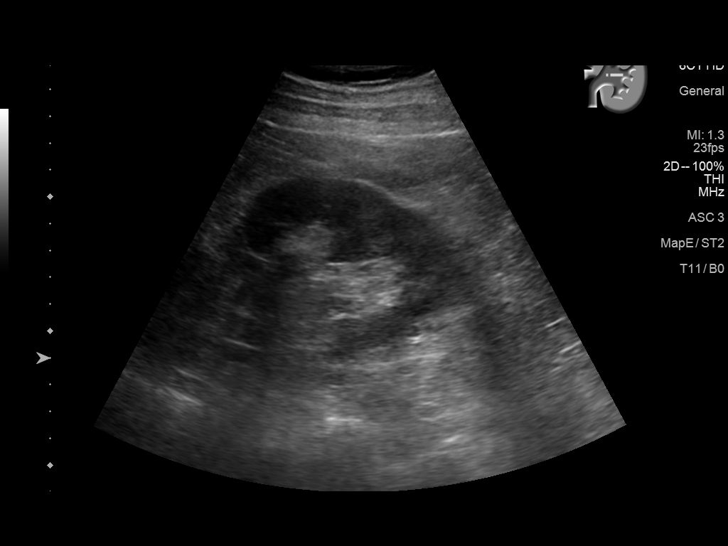
[im 37/45]
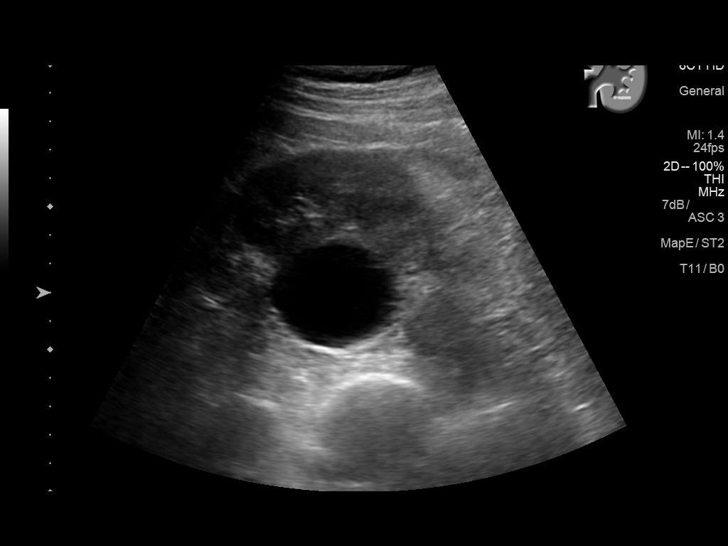
[im 41/45]
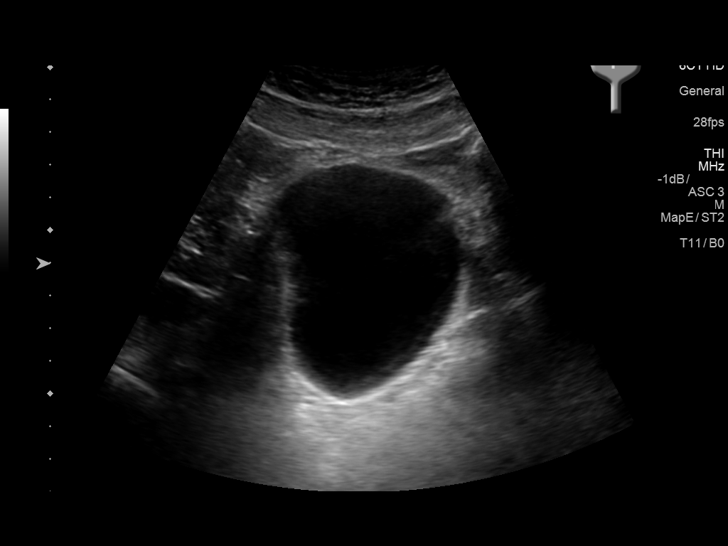
[im 45/45]
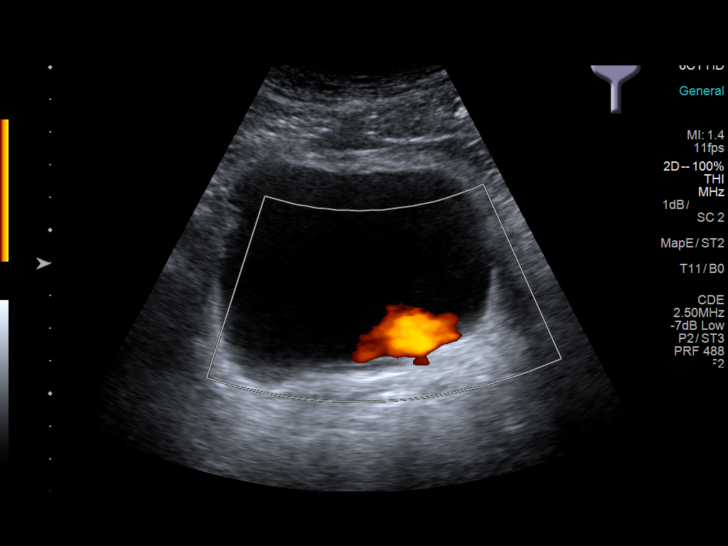

[14 of 25 positions shown; findings below may reference images not displayed]

FINDINGS: Right Kidney:

Length: 12.8 cm. Echogenicity within normal limits. No
hydronephrosis. Upper pole 8.5 x 8.3 x 7.4 cm cyst. Non-obstructing
upper pole 12.5 mm stone.

Left Kidney:

Length: 13 cm. Echogenicity within normal limits. No hydronephrosis.
Lower pole 5.1 x 4 x 5.4 cm cyst.

Bladder:

Appears normal for degree of bladder distention.
IMPRESSION: No hydronephrosis.

Bilateral renal cysts measuring up to 8.5 cm on the right and 5.4 cm
on left.

Nonobstructing 12.5 mm right renal calculus.

## 2019-05-19 ENCOUNTER — Other Ambulatory Visit: Payer: Self-pay | Admitting: Urology

## 2019-05-21 MED ORDER — TESTOSTERONE CYPIONATE 200 MG/ML IM SOLN: 200.0000 mg | INTRAMUSCULAR | 0 refills | Status: DC

## 2019-05-21 NOTE — Addendum Note (Signed)
Addended by: Chrystie Nose on: 05/21/2019 07:30 AM   Modules accepted: Orders

## 2019-06-26 ENCOUNTER — Telehealth: Payer: Self-pay

## 2019-06-26 NOTE — Telephone Encounter (Signed)
NOTES ON [place] [phone], SENT REFERRAL TO [facility]

## 2019-06-29 NOTE — Progress Notes (Signed)
Cardiology Office Note   Date:  07/01/2019   ID:  Devin Buckley, DOB [birth year], MRN [id number]  PCP:  Rusty Aus, MD    No chief complaint on file.  Aortic stenosis, AFib  Wt Readings from Last 3 Encounters:  07/01/19 214 lb (97.1 kg)  09/05/18 221 lb 9.6 oz (100.5 kg)  04/01/18 219 lb 11.2 oz (99.7 kg)       History of Present Illness:  Devin Buckley is a 79 y.o. male who is being seen today for the evaluation of aortic stenosis at the request of Rusty Aus, MD.  He was diagnosed in 2020 with AFib.    He had an echo sometime ago, but does not recall exactly when.  He wanted to have a cardiologist closer to home so he is transferring care to our office.   Eliquis has been recommended but he prefers to take a natural herbal Japanese blood thinner.   Denies : Chest pain. Dizziness. Leg edema. Nitroglycerin use. Orthopnea. Palpitations. Paroxysmal nocturnal dyspnea. Shortness of breath. Syncope.   He exercises regularly, doing cardio and strength training.  No cardiac sx.    Past Medical History:  Diagnosis Date  . Afib ([place])   . Anxiety   . Aortic atherosclerosis ([place])   . Cataract   . ED (erectile dysfunction)   . Edema   . Gout   . Heart murmur   . History of kidney stones   . Hormone disorder   . Hyperlipidemia   . Hyperlipidemia   . Lumbar disc disease   . Murmur   . Nephrolithiasis   . Patellofemoral arthritis   . SOB (shortness of breath)     Past Surgical History:  Procedure Laterality Date  . ACHILLES TENDON REPAIR Left   . arthroscopic shoulder  Right   . BROW LIFT Bilateral 04/08/2018   Procedure: BLEPHAROPLASTY BILATERAL LOWER LID;  Surgeon: Bea Graff, MD;  Location: [place];  Service: Plastics;  Laterality: Bilateral;  . RHYTIDECTOMY N/A 04/08/2018   Procedure: RHYTIDECTOMY;  Surgeon: Bea Graff, MD;  Location: [place];  Service: Plastics;  Laterality: N/A;     Current Outpatient Medications  Medication Sig Dispense Refill  .  apixaban (ELIQUIS) 5 MG TABS tablet Take 5 mg by mouth 2 (two) times daily.    . buprenorphine-naloxone (SUBOXONE) 8-2 mg SUBL SL tablet Place 1.5 tablets under the tongue every evening.    . clonazePAM (KLONOPIN) 0.5 MG tablet Take 0.5 mg by mouth 2 (two) times daily.     . Multiple Vitamins-Minerals (ICAPS AREDS 2) CAPS Take 2 tablets by mouth every evening.    . sildenafil (VIAGRA) 100 MG tablet TAKE ONE TABLET BY MOUTH EVERY DAY AS NEEDED FOR ERECTILE DYSFUNCTION    . testosterone cypionate (DEPOTESTOSTERONE CYPIONATE) 200 MG/ML injection Inject 1 mL (200 mg total) into the muscle every 14 (fourteen) days. 10 mL 0   No current facility-administered medications for this visit.    Allergies:   Patient has no known allergies.    Social History:  The patient  reports that he has never smoked. He has never used smokeless tobacco. He reports that he does not drink alcohol or use drugs.   Family History:  The patient's family history includes Alzheimer's disease in his father; CVA in his father; Colon cancer in his mother; Diabetes in his brother. No early CAD.   ROS:  Please see the history of present illness.   Otherwise, review of  systems are positive for hesitant to take Eliquis.   All other systems are reviewed and negative.    PHYSICAL EXAM: VS:  BP 124/62   Pulse (!) 101   Ht 6\' 2"  (1.88 m)   Wt 214 lb (97.1 kg)   SpO2 96%   BMI 27.48 kg/m  , BMI Body mass index is 27.48 kg/m. GEN: Well nourished, well developed, in no acute distress  HEENT: normal  Neck: no JVD, carotid bruits, or masses Cardiac: irregularly irregular; 2/6 systolic murmur, no rubs, or gallops,no edema  Respiratory:  clear to auscultation bilaterally, normal work of breathing GI: soft, nontender, nondistended, + BS MS: no deformity or atrophy  Skin: warm and dry, no rash Neuro:  Strength and sensation are intact Psych: euthymic mood, full affect   EKG:   The ekg ordered today demonstrates AFib,  borderline rate control   Recent Labs: 08/19/2018: Hemoglobin 16.7   Lipid Panel No results found for: CHOL, TRIG, HDL, CHOLHDL, VLDL, LDLCALC, LDLDIRECT   Other studies Reviewed: Additional studies/ records that were reviewed today with results demonstrating: .   ASSESSMENT AND PLAN:  1. AFib: Borderline rate control.  He is asymptomatic.  We discussed the risk of stroke associate with AFib and age > 43.  I recommended Eliquis.  He will think about it.  If he starts Eliquis, he will need CBC and BMet every 6 months.  Would check in April.  Check echo.  If EF is preserved, will hold off on adding rate control meds since he is active and asymptomatic.  If EF is low, then would have to consider more intensive rate control.  2. Moderate aortic stenosis:  No Sx of aortic stenosis. Will check echo.  3. Aortic atherosclerosis:  Noted on prior records.  He does have carotid atherosclerosis noted in 2016.  Check carotid Dopplre.  4. Hyperlipidemia: LDL 137 in 2020.  Given his PAD, would consider statin if he is willing.      Current medicines are reviewed at length with the patient today.  The patient concerns regarding his medicines were addressed.  The following changes have been made:  No change  Labs/ tests ordered today include: echo  Orders Placed This Encounter  Procedures  . EKG 12-Lead  . ECHOCARDIOGRAM COMPLETE  . VAS US CAROTID    Recommend 150 minutes/week of aerobic exercise Low fat, low carb, high fiber diet recommended  Disposition:   FU in 6 months   Signed, Larae Grooms, MD  07/01/2019 5:36 PM    [facility] [place], [place], [place]  [address] Phone: [phone]; Fax: [phone]

## 2019-07-01 ENCOUNTER — Ambulatory Visit (INDEPENDENT_AMBULATORY_CARE_PROVIDER_SITE_OTHER): Payer: Medicare Other | Admitting: Interventional Cardiology

## 2019-07-01 ENCOUNTER — Other Ambulatory Visit: Payer: Self-pay

## 2019-07-01 ENCOUNTER — Encounter: Payer: Self-pay | Admitting: Interventional Cardiology

## 2019-07-01 VITALS — BP 124/62 | HR 101 | Ht 74.0 in | Wt 214.0 lb

## 2019-07-01 DIAGNOSIS — I779 Disorder of arteries and arterioles, unspecified: Secondary | ICD-10-CM | POA: Diagnosis not present

## 2019-07-01 DIAGNOSIS — E782 Mixed hyperlipidemia: Secondary | ICD-10-CM | POA: Diagnosis not present

## 2019-07-01 DIAGNOSIS — I4821 Permanent atrial fibrillation: Secondary | ICD-10-CM

## 2019-07-01 DIAGNOSIS — I350 Nonrheumatic aortic (valve) stenosis: Secondary | ICD-10-CM | POA: Diagnosis not present

## 2019-07-01 NOTE — Patient Instructions (Signed)
Medication Instructions:  Your physician recommends that you continue on your current medications as directed. Please refer to the Current Medication list given to you today.  *If you need a refill on your cardiac medications before your next appointment, please call your pharmacy*  Lab Work: None ordered  If you have labs (blood work) drawn today and your tests are completely normal, you will receive your results only by: Marland Kitchen MyChart Message (if you have MyChart) OR . A paper copy in the mail If you have any lab test that is abnormal or we need to change your treatment, we will call you to review the results.  Testing/Procedures: Your physician has requested that you have a carotid duplex. This test is an ultrasound of the carotid arteries in your neck. It looks at blood flow through these arteries that supply the brain with blood. Allow one hour for this exam. There are no restrictions or special instructions.  Your physician has requested that you have an echocardiogram. Echocardiography is a painless test that uses sound waves to create images of your heart. It provides your doctor with information about the size and shape of your heart and how well your heart's chambers and valves are working. This procedure takes approximately one hour. There are no restrictions for this procedure.  Follow-Up: At [facility], you and your health needs are our priority.  As part of our continuing mission to provide you with exceptional heart care, we have created designated Provider Care Teams.  These Care Teams include your primary Cardiologist (physician) and Advanced Practice Providers (APPs -  Physician Assistants and Nurse Practitioners) who all work together to provide you with the care you need, when you need it.  Your next appointment:   6 month(s)  The format for your next appointment:   In Person  Provider:   You may see Casandra Doffing, MD or one of the following Advanced Practice Providers  on your designated Care Team:    Melina Copa, PA-C  Ermalinda Barrios, PA-C   Other Instructions

## 2019-07-11 ENCOUNTER — Ambulatory Visit: Payer: BLUE CROSS/BLUE SHIELD | Admitting: Internal Medicine

## 2019-07-14 ENCOUNTER — Ambulatory Visit (HOSPITAL_BASED_OUTPATIENT_CLINIC_OR_DEPARTMENT_OTHER): Payer: Medicare Other

## 2019-07-14 ENCOUNTER — Other Ambulatory Visit: Payer: Self-pay

## 2019-07-14 ENCOUNTER — Ambulatory Visit (HOSPITAL_COMMUNITY)
Admission: RE | Admit: 2019-07-14 | Discharge: 2019-07-14 | Disposition: A | Discharge status: Home / Self Care | Payer: Medicare Other | Source: Ambulatory Visit | Attending: Cardiology | Admitting: Cardiology

## 2019-07-14 DIAGNOSIS — I350 Nonrheumatic aortic (valve) stenosis: Secondary | ICD-10-CM | POA: Insufficient documentation

## 2019-07-14 DIAGNOSIS — I779 Disorder of arteries and arterioles, unspecified: Secondary | ICD-10-CM | POA: Diagnosis not present

## 2019-07-14 NOTE — Progress Notes (Signed)
Now with severe AS.  Would only recommend fixing if he has symptoms.  Watch for change in exercise tolerance. F/u echo in 1 year.  F/u visit in 1 year as well, or sooner if he has any symptoms.

## 2019-09-10 ENCOUNTER — Ambulatory Visit: Payer: Medicare Other | Admitting: Urology

## 2019-09-11 NOTE — Progress Notes (Signed)
09/12/19 9:09 AM   Devin Buckley Jun 05, 1941 [id number]  Referring provider: Rusty Aus, MD [facility] [place],  [place] [address]  Chief Complaint  Patient presents with  . Follow-up    Urologic history: 1.  Hypogonadism -On TRT; was on testosterone cypionate IM -Discontinued November 2020   2.  Nephrolithiasis -12 mm nonobstructing right renal calculus  3.  Erectile dysfunction  -On sildenafil  4.  Renal cyst -Bilateral simple renal cysts; largest on right measuring 8 cm; asymptomatic   HPI: 79 yo white M returns today for swollen foreskin.   -Onset 4 days ago -reports of penile swelling increasing as day progresses -States he is circumcised -No pain, itching or recent sexual encounters  -Has lower extremity edema  -Followed by cardiology for atrial fibrillation   PMH: Past Medical History:  Diagnosis Date  . Afib ([place])   . Anxiety   . Aortic atherosclerosis ([place])   . Cataract   . ED (erectile dysfunction)   . Edema   . Gout   . Heart murmur   . History of kidney stones   . Hormone disorder   . Hyperlipidemia   . Hyperlipidemia   . Lumbar disc disease   . Murmur   . Nephrolithiasis   . Patellofemoral arthritis   . SOB (shortness of breath)     Surgical History: Past Surgical History:  Procedure Laterality Date  . ACHILLES TENDON REPAIR Left   . arthroscopic shoulder  Right   . BROW LIFT Bilateral 04/08/2018   Procedure: BLEPHAROPLASTY BILATERAL LOWER LID;  Surgeon: Bea Graff, MD;  Location: [place];  Service: Plastics;  Laterality: Bilateral;  . RHYTIDECTOMY N/A 04/08/2018   Procedure: RHYTIDECTOMY;  Surgeon: Bea Graff, MD;  Location: [place];  Service: Plastics;  Laterality: N/A;    Home Medications:  Allergies as of 09/12/2019   No Known Allergies     Medication List       Accurate as of September 12, 2019  9:09 AM. If you have any questions, ask your nurse or doctor.         buprenorphine-naloxone 8-2 mg Subl SL tablet Commonly known as: SUBOXONE Place 1.5 tablets under the tongue every evening.   clonazePAM 0.5 MG tablet Commonly known as: KLONOPIN Take 0.5 mg by mouth 2 (two) times daily.   Eliquis 5 MG Tabs tablet Generic drug: apixaban Take 5 mg by mouth 2 (two) times daily.   ICaps Areds 2 Caps Take 2 tablets by mouth every evening.   sildenafil 100 MG tablet Commonly known as: VIAGRA TAKE ONE TABLET BY MOUTH EVERY DAY AS NEEDED FOR ERECTILE DYSFUNCTION   testosterone cypionate 200 MG/ML injection Commonly known as: DEPOTESTOSTERONE CYPIONATE Inject 1 mL (200 mg total) into the muscle every 14 (fourteen) days.       Allergies: No Known Allergies  Family History: Family History  Problem Relation Age of Onset  . Colon cancer Mother   . Alzheimer's disease Father   . CVA Father   . Diabetes Brother     Social History:  reports that he has never smoked. He has never used smokeless tobacco. He reports that he does not drink alcohol or use drugs.   Physical Exam: BP (!) 112/58   Pulse (!) 111   Ht 6\' 2"  (1.88 m)   Wt 215 lb (97.5 kg)   BMI 27.60 kg/m   Constitutional:  Alert and oriented, No acute distress. HEENT: [place] AT, moist  mucus membranes.  Trachea midline, no masses. Cardiovascular: No clubbing, cyanosis, or edema. Respiratory: Normal respiratory effort, no increased work of breathing. GU: No CVA tenderness. Penis redundant prepuce looks like pt is not circumcised, foreskin easily retractable, minimal preputial edema and glans normal w/o erythema.  Skin: No rashes, bruises or suspicious lesions. Extremities: Mild pitting edema up to thigh region Neurologic: Grossly intact, no focal deficits, moving all 4 extremities. Psychiatric: Normal mood and affdect.  Assessment & Plan:    1. Penile edema  Fairly normal exam this morning Since he stated edema increases as day progresses, likely related to mild fluid overload   Recommended Cardiology f/u for consideration of diuretic therapy    Devin Sons, MD  [facility] [address], [place], [place] [address] [phone]  I, Devin Buckley, am acting as a scribe for Dr. Nicki Reaper C. Buckley,  I have reviewed the above documentation for accuracy and completeness, and I agree with the above.   Devin Sons, MD

## 2019-09-12 ENCOUNTER — Encounter: Payer: Self-pay | Admitting: Urology

## 2019-09-12 ENCOUNTER — Other Ambulatory Visit: Payer: Self-pay

## 2019-09-12 ENCOUNTER — Ambulatory Visit (INDEPENDENT_AMBULATORY_CARE_PROVIDER_SITE_OTHER): Payer: Medicare Other | Admitting: Urology

## 2019-09-12 VITALS — BP 112/58 | HR 111 | Ht 74.0 in | Wt 215.0 lb

## 2019-09-12 DIAGNOSIS — N4889 Other specified disorders of penis: Secondary | ICD-10-CM

## 2019-12-04 ENCOUNTER — Ambulatory Visit (INDEPENDENT_AMBULATORY_CARE_PROVIDER_SITE_OTHER): Payer: Medicare Other | Admitting: Otolaryngology

## 2019-12-04 ENCOUNTER — Other Ambulatory Visit: Payer: Self-pay

## 2019-12-04 VITALS — Temp 97.9°F

## 2019-12-04 DIAGNOSIS — R040 Epistaxis: Secondary | ICD-10-CM | POA: Diagnosis not present

## 2019-12-04 NOTE — Progress Notes (Signed)
HPI: Devin Buckley is a 79 y.o. male who returns today for evaluation of nosebleed from the left side of his nose that occurred this morning.  He has had a couple recent nosebleeds.  The last time he had cauterization of a nosebleed was in February 2019 on the right side.  The more recent nosebleed is from the left side.Marland Kitchen  He is on no blood thinners.  Past Medical History:  Diagnosis Date   Afib ([place])    Anxiety    Aortic atherosclerosis ([place])    Cataract    ED (erectile dysfunction)    Edema    Gout    Heart murmur    History of kidney stones    Hormone disorder    Hyperlipidemia    Hyperlipidemia    Lumbar disc disease    Murmur    Nephrolithiasis    Patellofemoral arthritis    SOB (shortness of breath)    Past Surgical History:  Procedure Laterality Date   ACHILLES TENDON REPAIR Left    arthroscopic shoulder  Right    BROW LIFT Bilateral 04/08/2018   Procedure: BLEPHAROPLASTY BILATERAL LOWER LID;  Surgeon: Bea Graff, MD;  Location: [facility];  Service: Plastics;  Laterality: Bilateral;   RHYTIDECTOMY N/A 04/08/2018   Procedure: RHYTIDECTOMY;  Surgeon: Bea Graff, MD;  Location: [facility];  Service: Plastics;  Laterality: N/A;   Social History   Socioeconomic History   Marital status: Single    Spouse name: Not on file   Number of children: Not on file   Years of education: Not on file   Highest education level: Not on file  Occupational History   Not on file  Tobacco Use   Smoking status: Never Smoker   Smokeless tobacco: Never Used  Vaping Use   Vaping Use: Never used  Substance and Sexual Activity   Alcohol use: No   Drug use: No   Sexual activity: Never  Other Topics Concern   Not on file  Social History Narrative   Not on file   Social Determinants of Health   Financial Resource Strain:    Difficulty of Paying Living Expenses:   Food Insecurity:    Worried About Charity fundraiser in the Last Year:    Academic librarian in the Last Year:   Transportation Needs:    Film/video editor (Medical):    Lack of Transportation (Non-Medical):   Physical Activity:    Days of Exercise per Week:    Minutes of Exercise per Session:   Stress:    Feeling of Stress :   Social Connections:    Frequency of Communication with Friends and Family:    Frequency of Social Gatherings with Friends and Family:    Attends Religious Services:    Active Member of Clubs or Organizations:    Attends Music therapist:    Marital Status:    Family History  Problem Relation Age of Onset   Colon cancer Mother    Alzheimer's disease Father    CVA Father    Diabetes Brother    No Known Allergies Prior to Admission medications   Medication Sig Start Date End Date Taking? Authorizing Provider  apixaban (ELIQUIS) 5 MG TABS tablet Take 5 mg by mouth 2 (two) times daily.   Yes [provider]  buprenorphine-naloxone (SUBOXONE) 8-2 mg SUBL SL tablet Place 1.5 tablets under the tongue every evening.   Yes [provider]  clonazePAM Bobbye [place])  0.5 MG tablet Take 0.5 mg by mouth 2 (two) times daily.  08/11/16  Yes [provider]  Multiple Vitamins-Minerals (ICAPS AREDS 2) CAPS Take 2 tablets by mouth every evening.   Yes [provider]  sildenafil (VIAGRA) 100 MG tablet TAKE ONE TABLET BY MOUTH EVERY DAY AS NEEDED FOR ERECTILE DYSFUNCTION 04/30/18  Yes [provider]  testosterone cypionate (DEPOTESTOSTERONE CYPIONATE) 200 MG/ML injection Inject 1 mL (200 mg total) into the muscle every 14 (fourteen) days. 05/21/19  Yes Stoioff, Ronda Fairly, MD     Positive ROS: Otherwise negative  All other systems have been reviewed and were otherwise negative with the exception of those mentioned in the HPI and as above.  Physical Exam: Constitutional: Alert, well-appearing, no acute distress Ears: External ears without lesions or tenderness. Ear canals are clear  bilaterally with intact, clear TMs.  Nasal: External nose without lesions. Septum slightly deviated to the right.  There is a prominent vessel on the left side of the septum that bled as it bled again when I removed a small clot.  This was cauterized using silver nitrate in the office today with no further bleeding.  Remaining nasal cavity was clear.. Clear nasal passages Oral: Lips and gums without lesions. Tongue and palate mucosa without lesions. Posterior oropharynx clear. Neck: No palpable adenopathy or masses Respiratory: Breathing comfortably  Skin: No facial/neck lesions or rash noted.  Control of epistaxis  Date/Time: 12/04/2019 9:37 PM Performed by: Rozetta Nunnery, MD Authorized by: Rozetta Nunnery, MD   Consent:    Consent obtained:  Verbal   Consent given by:  Patient   Risks discussed:  Bleeding and pain   Alternatives discussed:  No treatment and observation Procedure details:    Treatment site:  L anterior   Treatment method:  Silver nitrate   Treatment complexity:  Limited   Treatment episode: initial   Post-procedure details:    Patient tolerance of procedure:  Tolerated well, no immediate complications Comments:     Left anterior septal vessel was cauterized using silver nitrate in the office today.  Patient tolerated this well.    Assessment: Left anterior epistaxis from septal vessel.  Plan: This was cauterized using silver nitrate.  He will follow-up as needed.   Radene Journey, MD

## 2020-03-17 ENCOUNTER — Ambulatory Visit: Payer: Self-pay | Admitting: Urology

## 2020-03-22 ENCOUNTER — Ambulatory Visit: Payer: Medicare Other | Admitting: Urology

## 2020-04-06 ENCOUNTER — Other Ambulatory Visit: Payer: Self-pay

## 2020-04-06 ENCOUNTER — Inpatient Hospital Stay: Payer: Medicare Other

## 2020-04-06 ENCOUNTER — Inpatient Hospital Stay: Payer: Medicare Other | Attending: Oncology | Admitting: Oncology

## 2020-04-06 VITALS — BP 124/83 | HR 96 | Temp 98.8°F | Resp 18 | Wt 217.2 lb

## 2020-04-06 DIAGNOSIS — D696 Thrombocytopenia, unspecified: Secondary | ICD-10-CM | POA: Diagnosis present

## 2020-04-06 DIAGNOSIS — Z79899 Other long term (current) drug therapy: Secondary | ICD-10-CM | POA: Diagnosis not present

## 2020-04-06 DIAGNOSIS — R1012 Left upper quadrant pain: Secondary | ICD-10-CM | POA: Insufficient documentation

## 2020-04-06 LAB — CBC WITH DIFFERENTIAL/PLATELET
Abs Immature Granulocytes: 0.03 10*3/uL (ref 0.00–0.07)
Basophils Absolute: 0.1 10*3/uL (ref 0.0–0.1)
Basophils Relative: 2 %
Eosinophils Absolute: 0.1 10*3/uL (ref 0.0–0.5)
Eosinophils Relative: 1 %
HCT: 42.8 % (ref 39.0–52.0)
Hemoglobin: 14.5 g/dL (ref 13.0–17.0)
Immature Granulocytes: 0 %
Lymphocytes Relative: 11 %
Lymphs Abs: 0.8 10*3/uL (ref 0.7–4.0)
MCH: 33.4 pg (ref 26.0–34.0)
MCHC: 33.9 g/dL (ref 30.0–36.0)
MCV: 98.6 fL (ref 80.0–100.0)
Monocytes Absolute: 0.7 10*3/uL (ref 0.1–1.0)
Monocytes Relative: 10 %
Neutro Abs: 5.4 10*3/uL (ref 1.7–7.7)
Neutrophils Relative %: 76 %
Platelets: 131 10*3/uL — ABNORMAL LOW (ref 150–400)
RBC: 4.34 MIL/uL (ref 4.22–5.81)
RDW: 15.8 % — ABNORMAL HIGH (ref 11.5–15.5)
WBC: 7 10*3/uL (ref 4.0–10.5)
nRBC: 0 % (ref 0.0–0.2)

## 2020-04-06 LAB — TECHNOLOGIST SMEAR REVIEW: Plt Morphology: DECREASED

## 2020-04-06 LAB — HEPATITIS C ANTIBODY: HCV Ab: NONREACTIVE

## 2020-04-06 LAB — VITAMIN B12: Vitamin B-12: 2023 pg/mL — ABNORMAL HIGH (ref 180–914)

## 2020-04-06 LAB — FOLATE: Folate: 57.6 ng/mL (ref >5.9)

## 2020-04-06 LAB — HIV ANTIBODY (ROUTINE TESTING W REFLEX): HIV Screen 4th Generation wRfx: NONREACTIVE

## 2020-04-06 NOTE — Progress Notes (Signed)
Cardiology Office Note   Date:  04/07/2020   ID:  Devin Buckley, DOB July 30, 1940, MRN [id number]  PCP:  Rusty Aus, MD    No chief complaint on file.  AFib, aortic stenosis  Wt Readings from Last 3 Encounters:  04/07/20 215 lb 12.8 oz (97.9 kg)  09/12/19 215 lb (97.5 kg)  07/01/19 214 lb (97.1 kg)       History of Present Illness: Devin Buckley is a 79 y.o. male  With severe aortic stenosis.  He was diagnosed in 2020 with AFib.    He had an echo sometime ago, but does not recall exactly when.  He wanted to have a cardiologist closer to home so he is transferring care to our office.   Eliquis has been recommended but he prefers to take a natural herbal Japanese blood thinner.   In Jan 2021, "Borderline rate control.  He is asymptomatic.  We discussed the risk of stroke associate with AFib and age > 30.  I recommended Eliquis.Marland KitchenMarland KitchenCheck echo.  If EF is preserved, will hold off on adding rate control meds since he is active and asymptomatic.  If EF is low, then would have to consider more intensive rate control. "  In Jan 2021, EF was normal but AS was severe. No sx with exercise.   Increasing leg swelling over the past 6 months.  He uses a treadmill at home.  He lifts weights as well.  Uses some type of "goldfish" machine that works his spine.    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[place])    Cataract    ED (erectile dysfunction)    Edema    Gout    Heart murmur    History of kidney stones    Hormone disorder    Hyperlipidemia    Hyperlipidemia    Lumbar disc disease    Murmur    Nephrolithiasis    Patellofemoral arthritis    SOB (shortness of breath)     Past Surgical History:  Procedure Laterality Date   ACHILLES TENDON REPAIR Left    arthroscopic shoulder  Right    BROW LIFT Bilateral 04/08/2018   Procedure: BLEPHAROPLASTY BILATERAL LOWER LID;  Surgeon: Bea Graff, MD;   Location: [place];  Service: Plastics;  Laterality: Bilateral;   RHYTIDECTOMY N/A 04/08/2018   Procedure: RHYTIDECTOMY;  Surgeon: Bea Graff, MD;  Location: [place];  Service: Plastics;  Laterality: N/A;     Current Outpatient Medications  Medication Sig Dispense Refill   buprenorphine-naloxone (SUBOXONE) 8-2 mg SUBL SL tablet Place 1.5 tablets under the tongue every evening.     clonazePAM (KLONOPIN) 0.5 MG tablet Take 0.5 mg by mouth 2 (two) times daily.      LASIX 20 MG tablet Take 20 mg by mouth daily.     Multiple Vitamins-Minerals (ICAPS AREDS 2) CAPS Take 2 tablets by mouth every evening.     sildenafil (VIAGRA) 100 MG tablet TAKE ONE TABLET BY MOUTH EVERY DAY AS NEEDED FOR ERECTILE DYSFUNCTION     No current facility-administered medications for this visit.    Allergies:   Patient has no known allergies.    Social History:  The patient  reports that he has never smoked. He has never used smokeless tobacco. He reports that he does not drink alcohol and does not use drugs.   Family History:  The patient's family history includes Alzheimer's disease  in his father; CVA in his father; Colon cancer in his mother; Diabetes in his brother.    ROS:  Please see the history of present illness.   Otherwise, review of systems are positive for leg swelling.   All other systems are reviewed and negative.    PHYSICAL EXAM: VS:  BP 110/78    Pulse 91    Ht 6\' 2"  (1.88 m)    Wt 215 lb 12.8 oz (97.9 kg)    SpO2 99%    BMI 27.71 kg/m  , BMI Body mass index is 27.71 kg/m. GEN: Well nourished, well developed, in no acute distress  HEENT: normal  Neck: ++ JVD, no carotid bruits, or masses Cardiac: irregularly irregular; no murmurs, rubs, or gallops,; 1+ bilateral pitting edema; venous discoloration  Respiratory:  clear to auscultation bilaterally, normal work of breathing GI: soft, nontender, nondistended, + BS MS: no deformity or atrophy  Skin: warm and dry, no rash Neuro:  Strength and  sensation are intact Psych: euthymic mood, full affect   EKG:   The ekg ordered today demonstrates AFib, rate controlled.    Recent Labs: 04/06/2020: Hemoglobin 14.5; Platelets 131   Lipid Panel No results found for: CHOL, TRIG, HDL, CHOLHDL, VLDL, LDLCALC, LDLDIRECT   Other studies Reviewed: Additional studies/ records that were reviewed today with results demonstrating: LDL 100 in 9/21.   ASSESSMENT AND PLAN:  1. Aortic stenosis: Severe by Jan echo.  Now with evidence of volume overload.   Repeat echo.  I am concerned that his EF has dropped and this is the cause of his volume overload.  We discussed the fact that he may need AVR based on the next echo.  The next step would be right and left heart cath.  2. AFib: Using a natural blood thinner.  Declined Eliquis.  3. Chronic diastolic heart failure: Based on AFib and AS, uses lasix to maintain volume status. Can increase Lasix to 40 mg.  Eat potassium rich food to maintain potassium.  4. Mild carotid disease bilaterally in 2021.    Current medicines are reviewed at length with the patient today.  The patient concerns regarding his medicines were addressed.  The following changes have been made:  No change  Labs/ tests ordered today include:  No orders of the defined types were placed in this encounter.   Recommend 150 minutes/week of aerobic exercise Low fat, low carb, high fiber diet recommended  Disposition:   FU for echo   Signed, Larae Grooms, MD  04/07/2020 12:13 PM    [facility] [address], [place], [place]  [address] Phone: [phone]; Fax: [phone]

## 2020-04-07 ENCOUNTER — Ambulatory Visit (INDEPENDENT_AMBULATORY_CARE_PROVIDER_SITE_OTHER): Payer: Medicare Other | Admitting: Interventional Cardiology

## 2020-04-07 ENCOUNTER — Encounter: Payer: Self-pay | Admitting: Interventional Cardiology

## 2020-04-07 VITALS — BP 110/78 | HR 91 | Ht 74.0 in | Wt 215.8 lb

## 2020-04-07 DIAGNOSIS — E782 Mixed hyperlipidemia: Secondary | ICD-10-CM

## 2020-04-07 DIAGNOSIS — I350 Nonrheumatic aortic (valve) stenosis: Secondary | ICD-10-CM | POA: Diagnosis not present

## 2020-04-07 DIAGNOSIS — I779 Disorder of arteries and arterioles, unspecified: Secondary | ICD-10-CM

## 2020-04-07 DIAGNOSIS — I5032 Chronic diastolic (congestive) heart failure: Secondary | ICD-10-CM | POA: Diagnosis not present

## 2020-04-07 DIAGNOSIS — I4821 Permanent atrial fibrillation: Secondary | ICD-10-CM

## 2020-04-07 NOTE — Patient Instructions (Signed)
Medication Instructions:  Your physician recommends that you continue on your current medications as directed. Please refer to the Current Medication list given to you today.  *If you need a refill on your cardiac medications before your next appointment, please call your pharmacy*   Lab Work: none If you have labs (blood work) drawn today and your tests are completely normal, you will receive your results only by: Marland Kitchen MyChart Message (if you have MyChart) OR . A paper copy in the mail If you have any lab test that is abnormal or we need to change your treatment, we will call you to review the results.   Testing/Procedures: Your physician has requested that you have an echocardiogram. Echocardiography is a painless test that uses sound waves to create images of your heart. It provides your doctor with information about the size and shape of your heart and how well your heart's chambers and valves are working. This procedure takes approximately one hour. There are no restrictions for this procedure.     Follow-Up: At [facility], you and your health needs are our priority.  As part of our continuing mission to provide you with exceptional heart care, we have created designated Provider Care Teams.  These Care Teams include your primary Cardiologist (physician) and Advanced Practice Providers (APPs -  Physician Assistants and Nurse Practitioners) who all work together to provide you with the care you need, when you need it.  We recommend signing up for the patient portal called "MyChart".  Sign up information is provided on this After Visit Summary.  MyChart is used to connect with patients for Virtual Visits (Telemedicine).  Patients are able to view lab/test results, encounter notes, upcoming appointments, etc.  Non-urgent messages can be sent to your provider as well.   To learn more about what you can do with MyChart, go to [email or web address].    Your next appointment:   To be  determined based on test results  The format for your next appointment:   In person  Provider:   You may see Larae Grooms, MD or one of the following Advanced Practice Providers on your designated Care Team:    Melina Copa, PA-C  Ermalinda Barrios, PA-C    Other Instructions

## 2020-04-09 ENCOUNTER — Encounter: Payer: Self-pay | Admitting: Oncology

## 2020-04-09 NOTE — Progress Notes (Signed)
Hematology/Oncology Consult note [facility] [phone] [phone]  Patient Care Team: Rusty Aus, MD as PCP - General (Internal Medicine) Jettie Booze, MD as PCP - Cardiology (Cardiology)   Name of the patient: Devin Buckley  [id number]  Feb 16, 1941    Reason for referral-thrombocytopenia   Referring physician-Dr. Emily Filbert  Date of visit: 04/09/20   History of presenting illness- Patient is a 79 year old male with a past medical history significant for anxiety disorder and erectile dysfunction referred for thrombocytopenia.  Most recent CBC from 03/11/2020 showed white count of 4.8, H&H of 14/42.4 months and a platelet count of 104.Patient has had mild thrombocytopenia with a platelet count of 140 since 2019 with drifted down to 122 in September 2020.  No other associated cytopenias and white count and hemoglobin have been normal.  No over-the-counter herbal medications.  Denies any recent changes in his medications.  No bleeding or bruising  ECOG PS- 1  Pain scale- 0   Review of systems- Review of Systems  Constitutional: Negative for chills, fever, malaise/fatigue and weight loss.  HENT: Negative for congestion, ear discharge and nosebleeds.   Eyes: Negative for blurred vision.  Respiratory: Negative for cough, hemoptysis, sputum production, shortness of breath and wheezing.   Cardiovascular: Negative for chest pain, palpitations, orthopnea and claudication.  Gastrointestinal: Negative for abdominal pain, blood in stool, constipation, diarrhea, heartburn, melena, nausea and vomiting.  Genitourinary: Negative for dysuria, flank pain, frequency, hematuria and urgency.  Musculoskeletal: Negative for back pain, joint pain and myalgias.  Skin: Negative for rash.  Neurological: Negative for dizziness, tingling, focal weakness, seizures, weakness and headaches.  Endo/Heme/Allergies: Does not bruise/bleed easily.    Psychiatric/Behavioral: Negative for depression and suicidal ideas. The patient does not have insomnia.     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[place])    Cataract    ED (erectile dysfunction)    Edema    Gout    Heart murmur    History of kidney stones    Hormone disorder    Hyperlipidemia    Hyperlipidemia    Lumbar disc disease    Murmur    Nephrolithiasis    Patellofemoral arthritis    SOB (shortness of breath)      Past Surgical History:  Procedure Laterality Date   ACHILLES TENDON REPAIR Left    arthroscopic shoulder  Right    BROW LIFT Bilateral 04/08/2018   Procedure: BLEPHAROPLASTY BILATERAL LOWER LID;  Surgeon: Bea Graff, MD;  Location: [place];  Service: Plastics;  Laterality: Bilateral;   RHYTIDECTOMY N/A 04/08/2018   Procedure: RHYTIDECTOMY;  Surgeon: Bea Graff, MD;  Location: [place];  Service: Plastics;  Laterality: N/A;    Social History   Socioeconomic History   Marital status: Single    Spouse name: Not on file   Number of children: Not on file   Years of education: Not on file   Highest education level: Not on file  Occupational History   Not on file  Tobacco Use  Smoking status: Never Smoker   Smokeless tobacco: Never Used  Vaping Use   Vaping Use: Never used  Substance and Sexual Activity   Alcohol use: No   Drug use: No   Sexual activity: Never  Other Topics Concern   Not on file  Social History Narrative   Not on file   Social Determinants of Health   Financial  Resource Strain:    Difficulty of Paying Living Expenses: Not on file  Food Insecurity:    Worried About [place] in the Last Year: Not on file   Ran Out of Food in the Last Year: Not on file  Transportation Needs:    Lack of Transportation (Medical): Not on file   Lack of Transportation (Non-Medical): Not on file  Physical Activity:    Days of Exercise per Week: Not on file   Minutes of Exercise per Session: Not on file  Stress:    Feeling of Stress : Not on file  Social Connections:    Frequency of Communication with Friends and Family: Not on file   Frequency of Social Gatherings with Friends and Family: Not on file   Attends Religious Services: Not on file   Active Member of Clubs or Organizations: Not on file   Attends Archivist Meetings: Not on file   Marital Status: Not on file  Intimate Partner Violence:    Fear of Current or Ex-Partner: Not on file   Emotionally Abused: Not on file   Physically Abused: Not on file   Sexually Abused: Not on file     Family History  Problem Relation Age of Onset   Colon cancer Mother    Alzheimer's disease Father    CVA Father    Diabetes Brother      Current Outpatient Medications:    buprenorphine-naloxone (SUBOXONE) 8-2 mg SUBL SL tablet, Place 1.5 tablets under the tongue every evening., Disp: , Rfl:    clonazePAM (KLONOPIN) 0.5 MG tablet, Take 0.5 mg by mouth 2 (two) times daily. , Disp: , Rfl:    LASIX 20 MG tablet, Take 20 mg by mouth daily., Disp: , Rfl:    Multiple Vitamins-Minerals (ICAPS AREDS 2) CAPS, Take 2 tablets by mouth every evening., Disp: , Rfl:    sildenafil (VIAGRA) 100 MG tablet, TAKE ONE TABLET BY MOUTH EVERY DAY AS NEEDED FOR ERECTILE DYSFUNCTION, Disp: , Rfl:    Physical exam: There were no vitals filed for this visit. Physical Exam Constitutional:      General: He is not in acute distress. Cardiovascular:     Rate and Rhythm: Normal rate and regular  rhythm.     Heart sounds: Normal heart sounds.  Pulmonary:     Effort: Pulmonary effort is normal.     Breath sounds: Normal breath sounds.  Abdominal:     General: Bowel sounds are normal.     Palpations: Abdomen is soft.  Skin:    General: Skin is warm and dry.  Neurological:     Mental Status: He is alert and oriented to person, place, and time.        CMP Latest Ref Rng & Units 04/01/2018  Glucose 70 - 99 mg/dL 119(H)  BUN 8 - 23 mg/dL 17  Creatinine 0.61 - 1.24 mg/dL 1.05  Sodium 135 - 145 mmol/L 139  Potassium 3.5 - 5.1 mmol/L 4.2  Chloride 98 - 111 mmol/L 104  CO2 22 - 32 mmol/L 29  Calcium 8.9 - 10.3 mg/dL  8.9   CBC Latest Ref Rng & Units 04/06/2020  WBC 4.0 - 10.5 K/uL 7.0  Hemoglobin 13.0 - 17.0 g/dL 14.5  Hematocrit 39 - 52 % 42.8  Platelets 150 - 400 K/uL 131(L)    Assessment and plan- Patient is a 79 y.o. male referred for mild isolated thrombocytopenia  Thrombocytopenia: This is isolated in the absence of other cytopenias.  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  We will check a CBC with differential,B12, folate, HIV and hepatitis C testing along with smear review and stool H. pylori antigen.  I will see him back in 2 weeks time to discuss the results.  Discussed what ITP is and that treatment would be indicated with steroids only when platelet counts dropped down to less than 30.  He also does not require a bone marrow biopsy at this time   Thank you for this kind referral and the opportunity to participate in the care of this patient   Visit Diagnosis 1. Thrombocytopenia ([place])   2. Abdominal pain, left upper quadrant     Dr. Randa Evens, MD, MPH [facility] [phone] 04/09/2020  11:39 AM

## 2020-04-13 ENCOUNTER — Other Ambulatory Visit: Payer: Self-pay

## 2020-04-13 ENCOUNTER — Ambulatory Visit (HOSPITAL_COMMUNITY): Payer: Medicare Other | Attending: Cardiovascular Disease

## 2020-04-13 DIAGNOSIS — I350 Nonrheumatic aortic (valve) stenosis: Secondary | ICD-10-CM | POA: Diagnosis present

## 2020-04-13 LAB — ECHOCARDIOGRAM COMPLETE
AR max vel: 0.77 cm2
AV Area VTI: 0.69 cm2
AV Area mean vel: 0.67 cm2
AV Mean grad: 43 mmHg
AV Peak grad: 67.6 mmHg
Ao pk vel: 4.11 m/s
Area-P 1/2: 4 cm2
S' Lateral: 3.4 cm

## 2020-04-14 ENCOUNTER — Telehealth: Payer: Self-pay | Admitting: *Deleted

## 2020-04-14 NOTE — Telephone Encounter (Signed)
Called pt and got voicemail and left message that we  Were waiting for him to clal back with dates he is free so we can order and schedule u/s for him. Left our direct number to call back at

## 2020-04-15 ENCOUNTER — Telehealth: Payer: Self-pay | Admitting: Interventional Cardiology

## 2020-04-15 NOTE — Telephone Encounter (Signed)
Cleon Gustin, RN  04/15/2020 12:42 PM EDT Back to Top    Spoke with patient and reviewed results and recommendations. Patient and his wife want to think about it before moving forward with the heart cath. Patient states that he will call us back and let us know.    Cleon Gustin, RN  04/15/2020 9:11 AM EDT     Late entry: Attempted to call patient yesterday with no answer. Left a message to call back.  Attempted to contact the patient again this morning and there was no answer. Left a message for patient to call back.       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 He should have a R/L heart cath as we discussed at his visit to eval what the best way to deal with his valve problem will be.

## 2020-04-15 NOTE — Telephone Encounter (Signed)
Patient calling [place] to go over echo results. Let patient know she would call when available.

## 2020-04-15 NOTE — Telephone Encounter (Signed)
See other phone note from today.

## 2020-04-15 NOTE — Telephone Encounter (Signed)
Patient is returning call to discuss echo results. °

## 2020-04-15 NOTE — Telephone Encounter (Signed)
Patient was returning phone call 

## 2020-04-20 ENCOUNTER — Inpatient Hospital Stay: Payer: Medicare Other | Admitting: Oncology

## 2020-11-19 ENCOUNTER — Telehealth: Payer: Self-pay | Admitting: Interventional Cardiology

## 2020-11-19 NOTE — Telephone Encounter (Signed)
Called pt in regards to swelling.   He reports that he is having swelling in his BLE and groin.  He expressed that bilateral legs are bluish in color from ankles to knees.  Per pt legs are warm, tight and cramp intermittently.   He does not take furosemide 20 mg daily as ordered.  He takes it about every other day.  He also does not wear compression socks.  He also does not have current weight he only stated that his normal weight is 205-207 lbs.  BP last week was 120/68. I advised him to start taking furosemide 20 mg PO QD to help with swelling.  Also, he should wear compression socks to help with swelling.  He reports he has socks but does not wear them.  He denies SOB, dizziness and CP.  I advised him that if his BLE become red, hot to touch, or he develops SOB to call 911.  I will route to Dr. Irish Lack for recommendations.

## 2020-11-19 NOTE — Telephone Encounter (Signed)
Pt c/o swelling: STAT is pt has developed SOB within 24 hours  1. If swelling, where is the swelling located? Legs and groin  2. How much weight have you gained and in what time span? States he has lost weight  3. Have you gained 3 pounds in a day or 5 pounds in a week? no  4. Do you have a log of your daily weights (if so, list)? Ranges from 215 to 205, has lost weight  5. Are you currently taking a fluid pill? yes  6. Are you currently SOB? no  7. Have you traveled recently? no   Patient states he has edema in his legs and groin. He states his legs are turning blue and purple. He states they well in the evening and he takes lasix. He states he does the treadmill and weights. He would like to have his legs checked out prior to his appointment 8/25.

## 2020-11-20 NOTE — Telephone Encounter (Signed)
I agree with the Lasix and compression stockings.  He can also elevate his ankles above the level of his heart to help the swelling.

## 2020-11-22 NOTE — Telephone Encounter (Signed)
Called patient back to follow up on swelling in BLE.  He has taken PO lasix as ordered since our last conversation.  He expresses that swelling has improved to BLE.  BLE are bluish/ pink in color.  He now reports he is having intermittent pain from his right hip down his calf.  He is also wondering if he should have heart cath that was recommended based on Echo results from Oct 2021.  Will route to Dr. Irish Lack and RN to address.

## 2020-11-22 NOTE — Telephone Encounter (Signed)
Swelling could be a sign of aortic stenosis progression.  OK to schedule for R/L heart cath.

## 2020-11-23 NOTE — Telephone Encounter (Signed)
I spoke with patient.  He would like to come in to discuss cath with Dr Irish Lack.  Appointment scheduled for June 10,2022 at 2:40

## 2020-11-25 ENCOUNTER — Other Ambulatory Visit: Payer: Self-pay | Admitting: Family Medicine

## 2020-11-25 DIAGNOSIS — M5441 Lumbago with sciatica, right side: Secondary | ICD-10-CM

## 2020-11-25 DIAGNOSIS — M5416 Radiculopathy, lumbar region: Secondary | ICD-10-CM

## 2020-11-25 DIAGNOSIS — M519 Unspecified thoracic, thoracolumbar and lumbosacral intervertebral disc disorder: Secondary | ICD-10-CM

## 2020-11-25 NOTE — Progress Notes (Signed)
Cardiology Office Note   Date:  11/26/2020   ID:  Devin Buckley, DOB 1941-03-08, MRN [id number]  PCP:  Rusty Aus, MD    No chief complaint on file.    Wt Readings from Last 3 Encounters:  11/26/20 212 lb 9.6 oz (96.4 kg)  04/07/20 215 lb 12.8 oz (97.9 kg)  04/06/20 217 lb 3.2 oz (98.5 kg)       History of Present Illness: Devin Buckley is a 80 y.o. male  With severe aortic stenosis.   He was diagnosed in 2020 with AFib.     He had an echo sometime ago, but does not recall exactly when.  He wanted to have a cardiologist closer to home so he is transferring care to our office.   Eliquis has been recommended but he prefers to take a natural herbal Japanese blood thinner.    In Jan 2021, "Borderline rate control.  He is asymptomatic.  We discussed the risk of stroke associate with AFib and age > 12.  I recommended Eliquis.Marland KitchenMarland KitchenCheck echo.  If EF is preserved, will hold off on adding rate control meds since he is active and asymptomatic.  If EF is low, then would have to consider more intensive rate control. "   In Jan 2021, EF was normal but AS was severe. No sx with exercise.    In June 2022, he reported increasing leg swelling over the past 6 months.  He comes in today to discuss cardiac cath as part of work-up for aortic valve replacement.  Continues to walk.  He does about 1 mile in 24 minutes on his treadmill.  He does not report chest pain, syncope.  Feels that his breathing is good while he is exercising.  Denies : Chest pain. Dizziness. Nitroglycerin use. Orthopnea. Palpitations. Paroxysmal nocturnal dyspnea. Shortness of breath. Syncope.        Past Medical History:  Diagnosis Date   Afib ([place])    Anxiety    Aortic atherosclerosis ([place])    Cataract    ED (erectile dysfunction)    Edema    Gout    Heart murmur    History of kidney stones    Hormone disorder    Hyperlipidemia    Hyperlipidemia    Lumbar disc disease    Murmur    Nephrolithiasis     Patellofemoral arthritis    SOB (shortness of breath)     Past Surgical History:  Procedure Laterality Date   ACHILLES TENDON REPAIR Left    arthroscopic shoulder  Right    BROW LIFT Bilateral 04/08/2018   Procedure: BLEPHAROPLASTY BILATERAL LOWER LID;  Surgeon: Bea Graff, MD;  Location: [place];  Service: Plastics;  Laterality: Bilateral;   RHYTIDECTOMY N/A 04/08/2018   Procedure: RHYTIDECTOMY;  Surgeon: Bea Graff, MD;  Location: [place] [facility];  Service: Plastics;  Laterality: N/A;     Current Outpatient Medications  Medication Sig Dispense Refill   buprenorphine-naloxone (SUBOXONE) 8-2 mg SUBL SL tablet Place 1.5 tablets under the tongue every evening.     clonazePAM (KLONOPIN) 0.5 MG tablet Take 0.5 mg by mouth 2 (two) times daily.      LASIX 20 MG tablet Take 20 mg by mouth daily.     Multiple Vitamins-Minerals (ICAPS AREDS 2) CAPS Take 2 tablets by mouth every evening.     No current facility-administered medications for this visit.    Allergies:   Patient has no known allergies.    Social History:  The patient  reports that he has never smoked. He has never used smokeless tobacco. He reports that he does not drink alcohol and does not use drugs.   Family History:  The patient's family history includes Alzheimer's disease in his father; CVA in his father; Colon cancer in his mother; Diabetes in his brother.    ROS:  Please see the history of present illness.   Otherwise, review of systems are positive for leg swelling.   All other systems are reviewed and negative.    PHYSICAL EXAM: VS:  BP 114/62   Pulse 87   Ht 6\' 2"  (1.88 m)   Wt 212 lb 9.6 oz (96.4 kg)   SpO2 93%   BMI 27.30 kg/m  , BMI Body mass index is 27.3 kg/m. GEN: Well nourished, well developed, in no acute distress HEENT: normal Neck:  JVD noted, no carotid bruits, or masses Cardiac: irregularly irregular; no murmurs, rubs, or gallops,; 2+ bilateral lower extremity pitting edema  Respiratory:  clear to  auscultation bilaterally, normal work of breathing GI: soft, nontender, nondistended, + BS MS: no deformity or atrophy Skin: warm and dry, no rash; pretibial skin is shiny consistent with fluid overload Neuro:  Strength and sensation are intact Psych: euthymic mood, full affect   EKG:   The ekg ordered today demonstrates AFib, rate controlled   Recent Labs: 04/06/2020: Hemoglobin 14.5; Platelets 131   Lipid Panel No results found for: CHOL, TRIG, HDL, CHOLHDL, VLDL, LDLCALC, LDLDIRECT   Other studies Reviewed: Additional studies/ records that were reviewed today with results demonstrating: .   ASSESSMENT AND PLAN:   Aortic stenosis: I was concerned about progression of aortic stenosis in late 2021 is the cause of a drop in his ejection fraction.  We discussed cath at that time but he deferred.  Given progression of his symptoms, I do think cardiac cath is warranted with likely aortic valve replacement to follow.   Chronic diastolic heart failure: Has some volume overload.  Uses Lasix.  Elevate legs for the edema.  Okay to take an extra 20 mg of Lasix on occasion if swelling gets worse. AFib: Chronic, rate controlled.  Anticoagulation recommended for stroke prevention.  He prefers to take his natural blood thinner rather than Eliquis or Xarelto, both of which I offered to prescribe. LE edema: He has bilateral lower extremity edema which I think is related more to volume overload from his heart.  He has some right leg pain and some were found out that he was supposed to have a lower extremity venous Doppler to rule out DVT back in 2015. due to this finding, he would like to have this test to make sure there is no DVT.  Will order lower extremity venous duplex.  If there is no DVT, I explained to him that I do think this is probably his aortic valve.  He requested also that we do another echocardiogram.  I believe we will continue to see severe aortic stenosis.  We will see where his LV  function is.  It is important to note that once people have symptoms from aortic stenosis, the mortality rate after 1 year is significant.   Current medicines are reviewed at length with the patient today.  The patient concerns regarding his medicines were addressed.  The following changes have been made:  No change  Labs/ tests ordered today include:  No orders of the defined types were placed in this encounter.   Recommend 150 minutes/week of aerobic  exercise Low fat, low carb, high fiber diet recommended  Disposition:   FU in for echo   Signed, Larae Grooms, MD  11/26/2020 3:04 PM    [facility] [address], [place], [place]  [address] Phone: [phone]; Fax: [phone]

## 2020-11-26 ENCOUNTER — Ambulatory Visit (INDEPENDENT_AMBULATORY_CARE_PROVIDER_SITE_OTHER): Payer: Medicare Other | Admitting: Interventional Cardiology

## 2020-11-26 ENCOUNTER — Other Ambulatory Visit: Payer: Self-pay

## 2020-11-26 ENCOUNTER — Encounter: Payer: Self-pay | Admitting: Interventional Cardiology

## 2020-11-26 VITALS — BP 114/62 | HR 87 | Ht 74.0 in | Wt 212.6 lb

## 2020-11-26 DIAGNOSIS — I5032 Chronic diastolic (congestive) heart failure: Secondary | ICD-10-CM

## 2020-11-26 DIAGNOSIS — E782 Mixed hyperlipidemia: Secondary | ICD-10-CM

## 2020-11-26 DIAGNOSIS — I350 Nonrheumatic aortic (valve) stenosis: Secondary | ICD-10-CM

## 2020-11-26 DIAGNOSIS — I4821 Permanent atrial fibrillation: Secondary | ICD-10-CM

## 2020-11-26 DIAGNOSIS — R600 Localized edema: Secondary | ICD-10-CM

## 2020-11-26 NOTE — Patient Instructions (Signed)
Medication Instructions:  Your physician recommends that you continue on your current medications as directed. Please refer to the Current Medication list given to you today.  *If you need a refill on your cardiac medications before your next appointment, please call your pharmacy*   Lab Work: none If you have labs (blood work) drawn today and your tests are completely normal, you will receive your results only by: [place] (if you have MyChart) OR A paper copy in the mail If you have any lab test that is abnormal or we need to change your treatment, we will call you to review the results.   Testing/Procedures: Your physician has requested that you have an echocardiogram. Echocardiography is a painless test that uses sound waves to create images of your heart. It provides your doctor with information about the size and shape of your heart and how well your heart's chambers and valves are working. This procedure takes approximately one hour. There are no restrictions for this procedure.  Your physician has requested that you have a lower or upper extremity venous duplex. This test is an ultrasound of the veins in the legs or arms. It looks at venous blood flow that carries blood from the heart to the legs or arms. Allow one hour for a Lower Venous exam. Allow thirty minutes for an Upper Venous exam. There are no restrictions or special instructions.     Follow-Up: At [facility], you and your health needs are our priority.  As part of our continuing mission to provide you with exceptional heart care, we have created designated Provider Care Teams.  These Care Teams include your primary Cardiologist (physician) and Advanced Practice Providers (APPs -  Physician Assistants and Nurse Practitioners) who all work together to provide you with the care you need, when you need it.  We recommend signing up for the patient portal called "MyChart".  Sign up information is provided on this After  Visit Summary.  MyChart is used to connect with patients for Virtual Visits (Telemedicine).  Patients are able to view lab/test results, encounter notes, upcoming appointments, etc.  Non-urgent messages can be sent to your provider as well.   To learn more about what you can do with MyChart, go to [email or web address].    Your next appointment:   Based on test results  The format for your next appointment:   In Person  Provider:   You may see Larae Grooms, MD or one of the following Advanced Practice Providers on your designated Care Team:   Melina Copa, PA-C Ermalinda Barrios, PA-C   Other Instructions

## 2020-12-03 ENCOUNTER — Ambulatory Visit (HOSPITAL_COMMUNITY)
Admission: RE | Admit: 2020-12-03 | Payer: Medicare Other | Source: Ambulatory Visit | Attending: Interventional Cardiology | Admitting: Interventional Cardiology

## 2020-12-06 ENCOUNTER — Ambulatory Visit: Payer: Medicare Other

## 2020-12-07 ENCOUNTER — Other Ambulatory Visit: Payer: Self-pay

## 2020-12-07 ENCOUNTER — Ambulatory Visit
Admission: RE | Admit: 2020-12-07 | Discharge: 2020-12-07 | Disposition: A | Discharge status: Home / Self Care | Payer: Medicare Other | Source: Ambulatory Visit | Attending: Family Medicine | Admitting: Family Medicine

## 2020-12-07 DIAGNOSIS — M5441 Lumbago with sciatica, right side: Secondary | ICD-10-CM | POA: Insufficient documentation

## 2020-12-07 DIAGNOSIS — M519 Unspecified thoracic, thoracolumbar and lumbosacral intervertebral disc disorder: Secondary | ICD-10-CM | POA: Diagnosis present

## 2020-12-07 DIAGNOSIS — M5416 Radiculopathy, lumbar region: Secondary | ICD-10-CM | POA: Diagnosis present

## 2020-12-21 ENCOUNTER — Other Ambulatory Visit (HOSPITAL_COMMUNITY): Payer: Medicare Other

## 2021-01-26 ENCOUNTER — Encounter (HOSPITAL_COMMUNITY): Payer: Self-pay | Admitting: Interventional Cardiology

## 2021-02-08 ENCOUNTER — Telehealth (HOSPITAL_COMMUNITY): Payer: Self-pay | Admitting: Interventional Cardiology

## 2021-02-08 NOTE — Telephone Encounter (Signed)
Just an FYI. We have made several attempts to contact this patient including sending a letter to schedule or reschedule their echocardiogram. We will be removing the patient from the echo WQ.   MAILED LETTER [facility]  01/26/21 [facility] to schedule @ 10:16/LBW  01/21/21 [facility] to reschedule @ 9:12am/LBW  12/17/2020 8:05 AM [id number], EDWINA V  Cancel Rsn: Patient (Unable to make appt.)     Thank you

## 2021-02-10 ENCOUNTER — Ambulatory Visit: Payer: Medicare Other | Admitting: Interventional Cardiology

## 2021-04-26 ENCOUNTER — Ambulatory Visit: Payer: Self-pay | Admitting: General Surgery

## 2021-04-26 MED ORDER — CEFAZOLIN SODIUM-DEXTROSE 2-4 GM/100ML-% IV SOLN
2.0000 g | INTRAVENOUS | Status: AC
  Administered 2021-04-27: 2 g via INTRAVENOUS

## 2021-04-26 NOTE — H&P (View-Only) (Signed)
PATIENT PROFILE: Devin Buckley is a 80 y.o. male who presents to the Clinic for consultation at the request of Dr. Sabra Heck for evaluation of left inguinal hernia.  PCP:  Yevonne Pax, MD  HISTORY OF PRESENT ILLNESS: [name] reports he started feeling inguinal hernia on the left groin since 3 days ago.  He endorsed that he is getting painful.  It has been getting difficult to reduce.  Reduction with pain.  Pain does not radiate to other part of body.  Pain aggravated by applying pressure.  Alleviating factor is laying down unable to reduce the hernia.  He denies any previous inguinal surgery.  Denies any abdominal distention nausea or vomiting.   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   Overview    15 mm, 7/15      Hyperlipemia, mixed 02/11/2015   Overview    Normal carotid Doppler, 8/16      Nephrolithiasis 11/12/2013   Overview    With hematuria, renal cyst CT 7/18, urology following      Lumbar disc disease 11/12/2013    GENERAL REVIEW OF SYSTEMS:   General ROS: negative for - chills, fatigue, fever, weight gain or weight loss Allergy and Immunology ROS: negative for - hives  Hematological and Lymphatic ROS: negative for - bleeding problems or bruising, negative for palpable nodes Endocrine ROS: negative for - heat or cold intolerance, hair changes Respiratory ROS: negative for - cough, shortness of breath or  wheezing Cardiovascular ROS: no chest pain or palpitations GI ROS: negative for nausea, vomiting, abdominal pain, diarrhea, constipation Musculoskeletal ROS: negative for - joint swelling or muscle pain.  Positive for back pain Neurological ROS: negative for - confusion, syncope Dermatological ROS: negative for pruritus and rash Psychiatric: negative for anxiety, depression, difficulty sleeping and memory loss  MEDICATIONS: Current Outpatient Medications  Medication Sig Dispense Refill   buprenorphine-naloxone (SUBOXONE) 8-2 mg SL film Place under the tongue every evening Takes 1.5 tablets every evening     clonazePAM (KLONOPIN) 0.5 MG tablet TAKE ONE TABLET 3 TIMES DAILY AS NEEDED FOR ANXIETY 90 tablet 5   hydrocortisone 2.5 % cream APPLY 1 APPLICATION TO THE AFFECTED AREA EVERY DAY     LASIX 20 mg tablet TAKE ONE TABLET BY MOUTH EVERY DAY 90 tablet 3   No current facility-administered medications for this visit.    ALLERGIES: Patient has no known allergies.  PAST MEDICAL HISTORY: Past Medical History:  Diagnosis Date   Cataract cortical, senile 2016   Gout 11/12/2013   Lumbar disc disease 11/12/2013   Nephrolithiasis 11/12/2013   kidney stones   Other and unspecified hyperlipidemia    Patellofemoral arthritis     PAST SURGICAL HISTORY: Past Surgical History:  Procedure Laterality Date   ACHILLES TENDON REPAIR Left    CATARACT  EXTRACTION  08/06/2017   COLONOSCOPY  12/17/2013   33m Adenomatous Polyp - repeat 6 months d/t removal technique per Dr. RRayann Heman  Elbow surgery Right    olecranon bursectomy   EXTRACTION CATARACT EXTRACAPSULAR W/INSERTION INTRAOCULAR PROSTHESIS Right 09/10/2017   Procedure: EXTRACTION CATARACT EXTRACAPSULAR W/INSERTION INTRAOCULAR PROSTHESIS;  Surgeon: DRoss Ludwig MD;  [place] [facility]  Service: Ophthalmology;  Laterality: Right;   SHOULDER ARTHROCENTESIS Right      FAMILY HISTORY: Family History  Problem Relation Age of Onset    Colon cancer Mother    Alzheimer's disease Father    Stroke Father    Diabetes type II Brother    Glaucoma Neg Hx    Macular degeneration Neg Hx      SOCIAL HISTORY: Social History   Socioeconomic History   Marital status: Married  Tobacco Use   Smoking status: Never   Smokeless tobacco: Never  Vaping Use   Vaping Use: Never used  Substance and Sexual Activity   Alcohol use: No   Drug use: No   Sexual activity: Yes    Partners: Female    PHYSICAL EXAM: Vitals:   04/26/21 1350  BP: 127/64  Pulse: 79   Body mass index is 26.52 kg/m. Weight: 91.2 kg (201 lb)   GENERAL: Alert, active, oriented x3  HEENT: Pupils equal reactive to light. Extraocular movements are intact. Sclera clear. Palpebral conjunctiva normal red color.Pharynx clear.  NECK: Supple with no palpable mass and no adenopathy.  LUNGS: Sound clear with no rales rhonchi or wheezes.  HEART: Regular rhythm S1 and S2 without murmur.  ABDOMEN: Soft and depressible, nontender with no palpable mass, no hepatomegaly.  Palpable and painful left inguinal hernia.  Reducible with pain. Abdomen non distended.  EXTREMITIES: Well-developed well-nourished symmetrical with no dependent edema.  NEUROLOGICAL: Awake alert oriented, facial expression symmetrical, moving all extremities.  REVIEW OF DATA: I have reviewed the following data today: Appointment on 03/14/2021  Component Date Value   WBC (White Blood Cell Co* 03/14/2021 4.5    RBC (Red Blood Cell Coun* 03/14/2021 4.46 (L)    Hemoglobin 03/14/2021 14.7    Hematocrit 03/14/2021 45.4    MCV (Mean Corpuscular Vo* 03/14/2021 101.8 (H)    MCH (Mean Corpuscular He* 03/14/2021 33.0 (H)    MCHC (Mean Corpuscular H* 03/14/2021 32.4    Platelet Count 03/14/2021 92 (L)    RDW-CV (Red Cell Distrib* 03/14/2021 14.8    MPV (Mean Platelet Volum* 03/14/2021 11.0    Neutrophils 03/14/2021 2.89    Lymphocytes 03/14/2021 0.83 (L)    Monocytes 03/14/2021 0.57     Eosinophils 03/14/2021 0.07    Basophils 03/14/2021 0.10 (H)    Neutrophil % 03/14/2021 64.6    Lymphocyte % 03/14/2021 18.5    Monocyte % 03/14/2021 12.7    Eosinophil % 03/14/2021 1.6    Basophil% 03/14/2021 2.2 (H)    Immature Granulocyte % 03/14/2021 0.4    Immature Granulocyte Cou* 03/14/2021 0.02    Glucose 03/14/2021 119 (H)    Sodium 03/14/2021 143    Potassium 03/14/2021 4.4    Chloride 03/14/2021 105    Carbon Dioxide (CO2) 03/14/2021 31.9    Urea Nitrogen (BUN) 03/14/2021 22    Creatinine 03/14/2021 0.9    Glomerular Filtration Ra* 03/14/2021 81    Calcium 03/14/2021 8.8    AST  03/14/2021 46 (H)    ALT  03/14/2021 27    Alk Phos (alkaline Phosp* 03/14/2021 142 (H)    Albumin  03/14/2021 3.3 (L)    Bilirubin, Total 03/14/2021 0.9    Protein, Total 03/14/2021 5.3 (L)    A/G Ratio 03/14/2021 1.7    Cholesterol, Total 03/14/2021 169    Triglyceride 03/14/2021 64    HDL (High Density Lipopr* 03/14/2021 47.4    LDL Calculated 03/14/2021 109    VLDL Cholesterol 03/14/2021 13    Cholesterol/HDL Ratio 03/14/2021 3.6    Color 03/14/2021 Yellow    Clarity 03/14/2021 Clear    Specific Gravity 03/14/2021 1.025    pH, Urine 03/14/2021 6.0    Protein, Urinalysis 03/14/2021 Negative    Glucose, Urinalysis 03/14/2021 Negative    Ketones, Urinalysis 03/14/2021 Negative    Blood, Urinalysis 03/14/2021 Moderate (!)    Nitrite, Urinalysis 03/14/2021 Negative    Leukocyte Esterase, Urin* 03/14/2021 Negative    White Blood Cells, Urina* 03/14/2021 0-3    Red Blood Cells, Urinaly* 03/14/2021 10-50 (!)    Bacteria, Urinalysis 03/14/2021 Rare (!)    Squamous Epithelial Cell* 03/14/2021 Rare    Thyroid Stimulating Horm* 03/14/2021 4.356    PSA (Prostate Specific A* 03/14/2021 0.03 (L)      ASSESSMENT: Mr. Goldsborough is a 80 y.o. male presenting for consultation for left versus bilateral inguinal hernia.    The patient presents with a symptomatic, reducible but painful left versus  bilateral inguinal hernia. Patient was oriented about the diagnosis of inguinal hernia and its implication. The patient was oriented about the treatment alternatives (observation vs surgical repair). Due to patient symptoms, repair is recommended. Patient oriented about the surgical procedure, the use of mesh and its risk of complications such as: infection, bleeding, injury to vas deference, vasculature and testicle, injury to bowel or bladder, and chronic pain.   Non-recurrent unilateral inguinal hernia without obstruction or gangrene [K40.90]  PLAN: 1.  Robotic assisted laparoscopic left vs bilateral inguinal hernia repair with mesh (43014) 2.  CBC, CMP done 3.  Avoid taking aspirin 5 days before procedure 4.  Contact us if has any question or concern.  Patient and his wife verbalized understanding, all questions were answered, and were agreeable with the plan outlined above.    Herbert Pun, MD  Electronically signed by Herbert Pun, MD

## 2021-04-26 NOTE — H&P (Signed)
PATIENT PROFILE: Devin Buckley is a 80 y.o. male who presents to the Clinic for consultation at the request of Dr. Sabra Heck for evaluation of left inguinal hernia.  PCP:  Yevonne Pax, MD  HISTORY OF PRESENT ILLNESS: Devin Buckley reports he started feeling inguinal hernia on the left groin since 3 days ago.  He endorsed that he is getting painful.  It has been getting difficult to reduce.  Reduction with pain.  Pain does not radiate to other part of body.  Pain aggravated by applying pressure.  Alleviating factor is laying down unable to reduce the hernia.  He denies any previous inguinal surgery.  Denies any abdominal distention nausea or vomiting.   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   Overview    15 mm, 7/15      Hyperlipemia, mixed 02/11/2015   Overview    Normal carotid Doppler, 8/16      Nephrolithiasis 11/12/2013   Overview    With hematuria, renal cyst CT 7/18, urology following      Lumbar disc disease 11/12/2013    GENERAL REVIEW OF SYSTEMS:   General ROS: negative for - chills, fatigue, fever, weight gain or weight loss Allergy and Immunology ROS: negative for - hives  Hematological and Lymphatic ROS: negative for - bleeding problems or bruising, negative for palpable nodes Endocrine ROS: negative for - heat or cold intolerance, hair changes Respiratory ROS: negative for - cough, shortness of breath or  wheezing Cardiovascular ROS: no chest pain or palpitations GI ROS: negative for nausea, vomiting, abdominal pain, diarrhea, constipation Musculoskeletal ROS: negative for - joint swelling or muscle pain.  Positive for back pain Neurological ROS: negative for - confusion, syncope Dermatological ROS: negative for pruritus and rash Psychiatric: negative for anxiety, depression, difficulty sleeping and memory loss  MEDICATIONS: Current Outpatient Medications  Medication Sig Dispense Refill   buprenorphine-naloxone (SUBOXONE) 8-2 mg SL film Place under the tongue every evening Takes 1.5 tablets every evening     clonazePAM (KLONOPIN) 0.5 MG tablet TAKE ONE TABLET 3 TIMES DAILY AS NEEDED FOR ANXIETY 90 tablet 5   hydrocortisone 2.5 % cream APPLY 1 APPLICATION TO THE AFFECTED AREA EVERY DAY     LASIX 20 mg tablet TAKE ONE TABLET BY MOUTH EVERY DAY 90 tablet 3   No current facility-administered medications for this visit.    ALLERGIES: Patient has no known allergies.  PAST MEDICAL HISTORY: Past Medical History:  Diagnosis Date   Cataract cortical, senile 2016   Gout 11/12/2013   Lumbar disc disease 11/12/2013   Nephrolithiasis 11/12/2013   kidney stones   Other and unspecified hyperlipidemia    Patellofemoral arthritis     PAST SURGICAL HISTORY: Past Surgical History:  Procedure Laterality Date   ACHILLES TENDON REPAIR Left    CATARACT  EXTRACTION  08/06/2017   COLONOSCOPY  12/17/2013   33m Adenomatous Polyp - repeat 6 months d/t removal technique per Dr. RRayann Heman  Elbow surgery Right    olecranon bursectomy   EXTRACTION CATARACT EXTRACAPSULAR W/INSERTION INTRAOCULAR PROSTHESIS Right 09/10/2017   Procedure: EXTRACTION CATARACT EXTRACAPSULAR W/INSERTION INTRAOCULAR PROSTHESIS;  Surgeon: DRoss Ludwig MD;  [place] [facility]  Service: Ophthalmology;  Laterality: Right;   SHOULDER ARTHROCENTESIS Right      FAMILY HISTORY: Family History  Problem Relation Age of Onset    Colon cancer Mother    Alzheimer's disease Father    Stroke Father    Diabetes type II Brother    Glaucoma Neg Hx    Macular degeneration Neg Hx      SOCIAL HISTORY: Social History   Socioeconomic History   Marital status: Married  Tobacco Use   Smoking status: Never   Smokeless tobacco: Never  Vaping Use   Vaping Use: Never used  Substance and Sexual Activity   Alcohol use: No   Drug use: No   Sexual activity: Yes    Partners: Female    PHYSICAL EXAM: Vitals:   04/26/21 1350  BP: 127/64  Pulse: 79   Body mass index is 26.52 kg/m. Weight: 91.2 kg (201 lb)   GENERAL: Alert, active, oriented x3  HEENT: Pupils equal reactive to light. Extraocular movements are intact. Sclera clear. Palpebral conjunctiva normal red color.Pharynx clear.  NECK: Supple with no palpable mass and no adenopathy.  LUNGS: Sound clear with no rales rhonchi or wheezes.  HEART: Regular rhythm S1 and S2 without murmur.  ABDOMEN: Soft and depressible, nontender with no palpable mass, no hepatomegaly.  Palpable and painful left inguinal hernia.  Reducible with pain. Abdomen non distended.  EXTREMITIES: Well-developed well-nourished symmetrical with no dependent edema.  NEUROLOGICAL: Awake alert oriented, facial expression symmetrical, moving all extremities.  REVIEW OF DATA: I have reviewed the following data today: Appointment on 03/14/2021  Component Date Value   WBC (White Blood Cell Co* 03/14/2021 4.5    RBC (Red Blood Cell Coun* 03/14/2021 4.46 (L)    Hemoglobin 03/14/2021 14.7    Hematocrit 03/14/2021 45.4    MCV (Mean Corpuscular Vo* 03/14/2021 101.8 (H)    MCH (Mean Corpuscular He* 03/14/2021 33.0 (H)    MCHC (Mean Corpuscular H* 03/14/2021 32.4    Platelet Count 03/14/2021 92 (L)    RDW-CV (Red Cell Distrib* 03/14/2021 14.8    MPV (Mean Platelet Volum* 03/14/2021 11.0    Neutrophils 03/14/2021 2.89    Lymphocytes 03/14/2021 0.83 (L)    Monocytes 03/14/2021 0.57     Eosinophils 03/14/2021 0.07    Basophils 03/14/2021 0.10 (H)    Neutrophil % 03/14/2021 64.6    Lymphocyte % 03/14/2021 18.5    Monocyte % 03/14/2021 12.7    Eosinophil % 03/14/2021 1.6    Basophil% 03/14/2021 2.2 (H)    Immature Granulocyte % 03/14/2021 0.4    Immature Granulocyte Cou* 03/14/2021 0.02    Glucose 03/14/2021 119 (H)    Sodium 03/14/2021 143    Potassium 03/14/2021 4.4    Chloride 03/14/2021 105    Carbon Dioxide (CO2) 03/14/2021 31.9    Urea Nitrogen (BUN) 03/14/2021 22    Creatinine 03/14/2021 0.9    Glomerular Filtration Ra* 03/14/2021 81    Calcium 03/14/2021 8.8    AST  03/14/2021 46 (H)    ALT  03/14/2021 27    Alk Phos (alkaline Phosp* 03/14/2021 142 (H)    Albumin  03/14/2021 3.3 (L)    Bilirubin, Total 03/14/2021 0.9    Protein, Total 03/14/2021 5.3 (L)    A/G Ratio 03/14/2021 1.7    Cholesterol, Total 03/14/2021 169    Triglyceride 03/14/2021 64    HDL (High Density Lipopr* 03/14/2021 47.4    LDL Calculated 03/14/2021 109    VLDL Cholesterol 03/14/2021 13    Cholesterol/HDL Ratio 03/14/2021 3.6    Color 03/14/2021 Yellow    Clarity 03/14/2021 Clear    Specific Gravity 03/14/2021 1.025    pH, Urine 03/14/2021 6.0    Protein, Urinalysis 03/14/2021 Negative    Glucose, Urinalysis 03/14/2021 Negative    Ketones, Urinalysis 03/14/2021 Negative    Blood, Urinalysis 03/14/2021 Moderate (!)    Nitrite, Urinalysis 03/14/2021 Negative    Leukocyte Esterase, Urin* 03/14/2021 Negative    White Blood Cells, Urina* 03/14/2021 0-3    Red Blood Cells, Urinaly* 03/14/2021 10-50 (!)    Bacteria, Urinalysis 03/14/2021 Rare (!)    Squamous Epithelial Cell* 03/14/2021 Rare    Thyroid Stimulating Horm* 03/14/2021 4.356    PSA (Prostate Specific A* 03/14/2021 0.03 (L)      ASSESSMENT: Mr. Goldsborough is a 80 y.o. male presenting for consultation for left versus bilateral inguinal hernia.    The patient presents with a symptomatic, reducible but painful left versus  bilateral inguinal hernia. Patient was oriented about the diagnosis of inguinal hernia and its implication. The patient was oriented about the treatment alternatives (observation vs surgical repair). Due to patient symptoms, repair is recommended. Patient oriented about the surgical procedure, the use of mesh and its risk of complications such as: infection, bleeding, injury to vas deference, vasculature and testicle, injury to bowel or bladder, and chronic pain.   Non-recurrent unilateral inguinal hernia without obstruction or gangrene [K40.90]  PLAN: 1.  Robotic assisted laparoscopic left vs bilateral inguinal hernia repair with mesh (43014) 2.  CBC, CMP done 3.  Avoid taking aspirin 5 days before procedure 4.  Contact us if has any question or concern.  Patient and his wife verbalized understanding, all questions were answered, and were agreeable with the plan outlined above.    Herbert Pun, MD  Electronically signed by Herbert Pun, MD

## 2021-04-27 ENCOUNTER — Encounter
Admission: RE | Disposition: A | Discharge status: Home / Self Care | Payer: Self-pay | Source: Home / Self Care | Attending: General Surgery

## 2021-04-27 ENCOUNTER — Ambulatory Visit: Payer: Medicare Other | Admitting: Anesthesiology

## 2021-04-27 ENCOUNTER — Ambulatory Visit
Admission: RE | Admit: 2021-04-27 | Discharge: 2021-04-27 | Disposition: A | Discharge status: Home / Self Care | Payer: Medicare Other | Attending: General Surgery | Admitting: General Surgery

## 2021-04-27 ENCOUNTER — Encounter: Payer: Self-pay | Admitting: General Surgery

## 2021-04-27 ENCOUNTER — Other Ambulatory Visit: Payer: Self-pay

## 2021-04-27 DIAGNOSIS — K4090 Unilateral inguinal hernia, without obstruction or gangrene, not specified as recurrent: Secondary | ICD-10-CM | POA: Insufficient documentation

## 2021-04-27 HISTORY — PX: INSERTION OF MESH: SHX5868

## 2021-04-27 SURGERY — REPAIR, HERNIA, INGUINAL, BILATERAL, ROBOT-ASSISTED
Anesthesia: General | Site: Groin | Laterality: Left

## 2021-04-27 MED ORDER — FENTANYL CITRATE (PF) 100 MCG/2ML IJ SOLN
INTRAMUSCULAR | Status: AC
  Filled 2021-04-27: qty 2

## 2021-04-27 MED ORDER — HYDROCODONE-ACETAMINOPHEN 5-325 MG PO TABS: 1.0000 | ORAL_TABLET | ORAL | 0 refills | Status: AC | PRN

## 2021-04-27 MED ORDER — CHLORHEXIDINE GLUCONATE 0.12 % MT SOLN: 15.0000 mL | Freq: Once | OROMUCOSAL | Status: AC

## 2021-04-27 MED ORDER — SUGAMMADEX SODIUM 200 MG/2ML IV SOLN
INTRAVENOUS | Status: DC | PRN
Start: 2021-04-27 — End: 2021-04-27
  Administered 2021-04-27: 200 mg via INTRAVENOUS

## 2021-04-27 MED ORDER — CEFAZOLIN SODIUM-DEXTROSE 2-4 GM/100ML-% IV SOLN
INTRAVENOUS | Status: AC
  Filled 2021-04-27: qty 100

## 2021-04-27 MED ORDER — MIDAZOLAM HCL 2 MG/2ML IJ SOLN
INTRAMUSCULAR | Status: AC
  Filled 2021-04-27: qty 2

## 2021-04-27 MED ORDER — ONDANSETRON HCL 4 MG/2ML IJ SOLN
INTRAMUSCULAR | Status: DC | PRN
  Administered 2021-04-27: 4 mg via INTRAVENOUS

## 2021-04-27 MED ORDER — FENTANYL CITRATE (PF) 100 MCG/2ML IJ SOLN
25.0000 ug | INTRAMUSCULAR | Status: DC | PRN
  Administered 2021-04-27 (×2): 50 ug via INTRAVENOUS
  Administered 2021-04-27 (×2): 25 ug via INTRAVENOUS

## 2021-04-27 MED ORDER — ORAL CARE MOUTH RINSE: 15.0000 mL | Freq: Once | OROMUCOSAL | Status: AC

## 2021-04-27 MED ORDER — ROCURONIUM BROMIDE 10 MG/ML (PF) SYRINGE
PREFILLED_SYRINGE | INTRAVENOUS | Status: AC
  Filled 2021-04-27: qty 10

## 2021-04-27 MED ORDER — ROCURONIUM BROMIDE 100 MG/10ML IV SOLN
INTRAVENOUS | Status: DC | PRN
  Administered 2021-04-27: 30 mg via INTRAVENOUS
  Administered 2021-04-27: 50 mg via INTRAVENOUS
  Administered 2021-04-27 (×3): 20 mg via INTRAVENOUS

## 2021-04-27 MED ORDER — ACETAMINOPHEN 10 MG/ML IV SOLN
INTRAVENOUS | Status: DC | PRN
  Administered 2021-04-27: 1000 mg via INTRAVENOUS

## 2021-04-27 MED ORDER — PHENYLEPHRINE HCL-NACL 20-0.9 MG/250ML-% IV SOLN
INTRAVENOUS | Status: DC | PRN
  Administered 2021-04-27: 30 ug/min via INTRAVENOUS

## 2021-04-27 MED ORDER — PROPOFOL 10 MG/ML IV BOLUS
INTRAVENOUS | Status: DC | PRN
  Administered 2021-04-27: 120 mg via INTRAVENOUS

## 2021-04-27 MED ORDER — FENTANYL CITRATE (PF) 100 MCG/2ML IJ SOLN
INTRAMUSCULAR | Status: DC | PRN
  Administered 2021-04-27 (×4): 50 ug via INTRAVENOUS

## 2021-04-27 MED ORDER — ACETAMINOPHEN 10 MG/ML IV SOLN
INTRAVENOUS | Status: AC
  Filled 2021-04-27: qty 100

## 2021-04-27 MED ORDER — PHENYLEPHRINE HCL (PRESSORS) 10 MG/ML IV SOLN
INTRAVENOUS | Status: DC | PRN
Start: 2021-04-27 — End: 2021-04-27
  Administered 2021-04-27 (×3): 100 ug via INTRAVENOUS

## 2021-04-27 MED ORDER — DEXAMETHASONE SODIUM PHOSPHATE 10 MG/ML IJ SOLN
INTRAMUSCULAR | Status: DC | PRN
  Administered 2021-04-27: 10 mg via INTRAVENOUS

## 2021-04-27 MED ORDER — KETOROLAC TROMETHAMINE 30 MG/ML IJ SOLN
INTRAMUSCULAR | Status: AC
  Filled 2021-04-27: qty 1

## 2021-04-27 MED ORDER — KETOROLAC TROMETHAMINE 30 MG/ML IJ SOLN
30.0000 mg | Freq: Once | INTRAMUSCULAR | Status: AC
  Administered 2021-04-27: 30 mg via INTRAVENOUS

## 2021-04-27 MED ORDER — LIDOCAINE HCL (CARDIAC) PF 100 MG/5ML IV SOSY
PREFILLED_SYRINGE | INTRAVENOUS | Status: DC | PRN
  Administered 2021-04-27: 100 mg via INTRAVENOUS

## 2021-04-27 MED ORDER — MEPERIDINE HCL 25 MG/ML IJ SOLN
6.2500 mg | INTRAMUSCULAR | Status: DC | PRN
Start: 2021-04-27 — End: 2021-04-27

## 2021-04-27 MED ORDER — OXYCODONE HCL 5 MG/5ML PO SOLN: 5.0000 mg | Freq: Once | ORAL | Status: AC | PRN

## 2021-04-27 MED ORDER — DEXMEDETOMIDINE (PRECEDEX) IN NS 20 MCG/5ML (4 MCG/ML) IV SYRINGE
PREFILLED_SYRINGE | INTRAVENOUS | Status: DC | PRN
  Administered 2021-04-27 (×2): 4 ug via INTRAVENOUS

## 2021-04-27 MED ORDER — BUPIVACAINE-EPINEPHRINE 0.25% -1:200000 IJ SOLN
INTRAMUSCULAR | Status: DC | PRN
  Administered 2021-04-27: 30 mL

## 2021-04-27 MED ORDER — CHLORHEXIDINE GLUCONATE 0.12 % MT SOLN
OROMUCOSAL | Status: AC
  Administered 2021-04-27: 15 mL via OROMUCOSAL
  Filled 2021-04-27: qty 15

## 2021-04-27 MED ORDER — LACTATED RINGERS IV SOLN: INTRAVENOUS | Status: DC

## 2021-04-27 MED ORDER — BUPIVACAINE-EPINEPHRINE (PF) 0.25% -1:200000 IJ SOLN
INTRAMUSCULAR | Status: AC
  Filled 2021-04-27: qty 30

## 2021-04-27 MED ORDER — OXYCODONE HCL 5 MG PO TABS
ORAL_TABLET | ORAL | Status: AC
  Filled 2021-04-27: qty 1

## 2021-04-27 MED ORDER — ONDANSETRON HCL 4 MG/2ML IJ SOLN: 4.0000 mg | Freq: Once | INTRAMUSCULAR | Status: DC | PRN

## 2021-04-27 MED ORDER — OXYCODONE HCL 5 MG PO TABS
5.0000 mg | ORAL_TABLET | Freq: Once | ORAL | Status: AC | PRN
  Administered 2021-04-27: 5 mg via ORAL

## 2021-04-27 SURGICAL SUPPLY — 52 items
ADH SKN CLS APL DERMABOND .7 (GAUZE/BANDAGES/DRESSINGS) ×2
APL PRP STRL LF DISP 70% ISPRP (MISCELLANEOUS) ×2
BAG INFUSER PRESSURE 100CC (MISCELLANEOUS) IMPLANT
BLADE SURG SZ11 CARB STEEL (BLADE) ×3 IMPLANT
CHLORAPREP W/TINT 26 (MISCELLANEOUS) ×3 IMPLANT
COVER TIP SHEARS 8 DVNC (MISCELLANEOUS) ×2 IMPLANT
COVER TIP SHEARS 8MM DA VINCI (MISCELLANEOUS) ×1
COVER WAND RF STERILE (DRAPES) ×3 IMPLANT
DEFOGGER SCOPE WARMER CLEARIFY (MISCELLANEOUS) ×3 IMPLANT
DERMABOND ADVANCED (GAUZE/BANDAGES/DRESSINGS) ×1
DERMABOND ADVANCED .7 DNX12 (GAUZE/BANDAGES/DRESSINGS) ×2 IMPLANT
DRAPE ARM DVNC X/XI (DISPOSABLE) ×6 IMPLANT
DRAPE COLUMN DVNC XI (DISPOSABLE) ×2 IMPLANT
DRAPE DA VINCI XI ARM (DISPOSABLE) ×3
DRAPE DA VINCI XI COLUMN (DISPOSABLE) ×1
ELECT REM PT RETURN 9FT ADLT (ELECTROSURGICAL) ×3
ELECTRODE REM PT RTRN 9FT ADLT (ELECTROSURGICAL) ×2 IMPLANT
GAUZE 4X4 16PLY ~~LOC~~+RFID DBL (SPONGE) ×3 IMPLANT
GLOVE SURG ENC MOIS LTX SZ6.5 (GLOVE) ×12 IMPLANT
GLOVE SURG UNDER POLY LF SZ6.5 (GLOVE) ×12 IMPLANT
GOWN STRL REUS W/ TWL LRG LVL3 (GOWN DISPOSABLE) ×8 IMPLANT
GOWN STRL REUS W/TWL LRG LVL3 (GOWN DISPOSABLE) ×12
IRRIGATOR SUCT 8 DISP DVNC XI (IRRIGATION / IRRIGATOR) IMPLANT
IRRIGATOR SUCTION 8MM XI DISP (IRRIGATION / IRRIGATOR)
IV CATH ANGIO 12GX3 LT BLUE (NEEDLE) IMPLANT
IV NS 1000ML (IV SOLUTION)
IV NS 1000ML BAXH (IV SOLUTION) IMPLANT
KIT PINK PAD W/HEAD ARE REST (MISCELLANEOUS) ×3 IMPLANT
KIT PINK PAD W/HEAD ARM REST (MISCELLANEOUS) ×2 IMPLANT
LABEL OR SOLS (LABEL) IMPLANT
MANIFOLD NEPTUNE II (INSTRUMENTS) ×3 IMPLANT
MESH 3DMAX 5X7 LT XLRG (Mesh General) ×1 IMPLANT
MESH 3DMAX MID 5X7 LT XLRG (Mesh General) ×2 IMPLANT
NEEDLE HYPO 22GX1.5 SAFETY (NEEDLE) ×3 IMPLANT
NEEDLE INSUFFLATION 14GA 120MM (NEEDLE) ×3 IMPLANT
OBTURATOR OPTICAL STANDARD 8MM (TROCAR) ×1
OBTURATOR OPTICAL STND 8 DVNC (TROCAR) ×2
OBTURATOR OPTICALSTD 8 DVNC (TROCAR) ×2 IMPLANT
PACK LAP CHOLECYSTECTOMY (MISCELLANEOUS) ×3 IMPLANT
SEAL CANN UNIV 5-8 DVNC XI (MISCELLANEOUS) ×6 IMPLANT
SEAL XI 5MM-8MM UNIVERSAL (MISCELLANEOUS) ×3
SET TUBE SMOKE EVAC HIGH FLOW (TUBING) ×3 IMPLANT
SOLUTION ELECTROLUBE (MISCELLANEOUS) ×3 IMPLANT
SUT MNCRL 4-0 (SUTURE) ×3
SUT MNCRL 4-0 27XMFL (SUTURE) ×2
SUT VIC AB 2-0 SH 27 (SUTURE) ×3
SUT VIC AB 2-0 SH 27XBRD (SUTURE) ×2 IMPLANT
SUT VLOC 90 S/L VL9 GS22 (SUTURE) ×3 IMPLANT
SUTURE MNCRL 4-0 27XMF (SUTURE) ×2 IMPLANT
TAPE TRANSPORE STRL 2 31045 (GAUZE/BANDAGES/DRESSINGS) IMPLANT
TRAY FOLEY MTR SLVR 16FR STAT (SET/KITS/TRAYS/PACK) IMPLANT
WATER STERILE IRR 500ML POUR (IV SOLUTION) IMPLANT

## 2021-04-27 NOTE — Anesthesia Procedure Notes (Signed)
Procedure Name: Intubation Date/Time: 04/27/2021 12:48 PM Performed by: Lily Peer, Luiza Carranco, CRNA Pre-anesthesia Checklist: Patient identified, Emergency Drugs available, Suction available and Patient being monitored Patient Re-evaluated:Patient Re-evaluated prior to induction Oxygen Delivery Method: Circle system utilized Preoxygenation: Pre-oxygenation with 100% oxygen Induction Type: IV induction Ventilation: Mask ventilation without difficulty Laryngoscope Size: McGraph and 4 Grade View: Grade I Tube type: Oral Tube size: 7.5 mm Number of attempts: 1 Airway Equipment and Method: Stylet Placement Confirmation: ETT inserted through vocal cords under direct vision, positive ETCO2 and breath sounds checked- equal and bilateral Secured at: 22 cm Tube secured with: Tape Dental Injury: Teeth and Oropharynx as per pre-operative assessment

## 2021-04-27 NOTE — Interval H&P Note (Signed)
History and Physical Interval Note:  04/27/2021 12:21 PM  Devin Buckley  has presented today for surgery, with the diagnosis of K40.90 non recurrent unilateral inguinal hernia w/o obstruction or gangrene.  The various methods of treatment have been discussed with the patient and family. After consideration of risks, benefits and other options for treatment, the patient has consented to  Procedure(s): XI ROBOTIC ASSISTED BILATERAL INGUINAL HERNIA (Bilateral) as a surgical intervention.  The patient's history has been reviewed, patient examined, no change in status, stable for surgery.  I have reviewed the patient's chart and labs.  Questions were answered to the patient's satisfaction.     Herbert Pun

## 2021-04-27 NOTE — Op Note (Signed)
Preoperative diagnosis: Left inguinal hernia.   Postoperative diagnosis: Left inguinal hernia.  Procedure: Robotic assisted Laparoscopic Transabdominal preperitoneal laparoscopic (TAPP) repair of left inguinal hernia.  Anesthesia: GETA  Surgeon: Dr. Windell Moment  Wound Classification: Clean  Indications:  Patient is a 80 y.o. male developed a symptomatic left inguinal hernia. Repair was indicated.  Findings: 1. Large indirect left Inguinal hernia identified 2. Vas deferens and cord structures identified and preserved 3. Bard XL Mid 3D Max mesh used for repair 4. Adequate hemostasis.  5. No umbilical or right groin hernia identified.          Description of procedure: The patient was taken to the operating room and the correct side of surgery was verified. The patient was placed supine with arms tucked at the sides. After obtaining adequate anesthesia, the patient's abdomen was prepped and draped in standard sterile fashion. The patient was placed in the Trendelenburg position. A time-out was completed verifying correct patient, procedure, site, positioning, and implant(s) and/or special equipment prior to beginning this procedure. A Veress needle was placed at the umbilicus and pneumoperitoneum created with insufflation of carbon dioxide to 15 mmHg. After the Veress needle was removed, an 8-mm trocar was placed on epigastric area and the 30 angled laparoscope inserted. Two 8-mm trocars were then placed lateral to the rectus sheath under direct visualization. Both inguinal regions were inspected and the median umbilical ligament, medial umbilical ligament, and lateral umbilical fold were identified.  The robotic arms were docked. The robotic scope was inserted and the pelvic area anatomy targeted.  The peritoneum was incised with scissors along a line 5 cm above the superior edge of the hernia defect, extending from the median umbilical ligament to the anterior superior iliac spine. The  peritoneal flap was mobilized inferiorly using blunt and sharp dissection. The inferior epigastric vessels were exposed and the pubic symphysis was identified. Cooper's ligament was dissected to its junction with the iliac vein. The dissection was continued inferiorly to the iliopubic tract, with care taken to avoid injury to the femoral branch of the genitofemoral nerve and the lateral femoral cutaneous nerve. The cord structures were parietalized. The hernia was identified and reduced by gentle traction.  The indirect hernia sac was noted mobilized from the cord structures and reduced into the peritoneal cavity.  A large piece of mesh was rolled longitudinally into a compact cylinder and passed through a trocar. The cylinder was placed along the inferior aspect of the working space and unrolled into place to completely cover the direct, indirect, and femoral spaces. The mesh was secured into place superiorly to the anterior abdominal wall and inferiorly and medially to Cooper's ligament with absorbable sutures. Care was taken to avoid the inferolateral triangles containing the iliac vessels and genital nerves. The peritoneal flap was closed over the mesh and secured with suture in similar positions of safety. After ensuring adequate hemostasis, the trocars were removed and the pneumoperitoneum allowed to escape. The trocar incisions were closed using monocryl and skin adhesive dressings applied.  The patient tolerated the procedure well and was taken to the postanesthesia care unit in stable condition.   Specimen: None  Complications: None  Estimated Blood Loss: 5 mL

## 2021-04-27 NOTE — Anesthesia Preprocedure Evaluation (Signed)
Anesthesia Evaluation  Patient identified by MRN, date of birth, ID band Patient awake    Reviewed: Allergy & Precautions, NPO status , Patient's Chart, lab work & pertinent test results  History of Anesthesia Complications Negative for: history of anesthetic complications  Airway Mallampati: II  TM Distance: >3 FB Neck ROM: Full    Dental no notable dental hx.    Pulmonary neg pulmonary ROS, neg sleep apnea, neg COPD,    breath sounds clear to auscultation- rhonchi (-) wheezing      Cardiovascular Exercise Tolerance: Good (-) hypertension(-) CAD, (-) Past MI, (-) Cardiac Stents and (-) CABG + dysrhythmias Atrial Fibrillation  Rhythm:Regular Rate:Normal - Systolic murmurs and - Diastolic murmurs    Neuro/Psych neg Seizures PSYCHIATRIC DISORDERS Anxiety negative neurological ROS     GI/Hepatic negative GI ROS, Neg liver ROS,   Endo/Other  negative endocrine ROSneg diabetes  Renal/GU Renal disease: hx of nephrolithiasis.     Musculoskeletal  (+) Arthritis ,   Abdominal (+) - obese,   Peds  Hematology negative hematology ROS (+)   Anesthesia Other Findings Past Medical History: No date: Afib ([place]) No date: Anxiety No date: Aortic atherosclerosis (HCC) No date: Cataract No date: ED (erectile dysfunction) No date: Edema No date: Gout No date: Heart murmur No date: History of kidney stones No date: Hormone disorder No date: Hyperlipidemia No date: Hyperlipidemia No date: Lumbar disc disease No date: Murmur No date: Nephrolithiasis No date: Patellofemoral arthritis No date: SOB (shortness of breath)   Reproductive/Obstetrics                             Anesthesia Physical Anesthesia Plan  ASA: 2  Anesthesia Plan: General   Post-op Pain Management:    Induction: Intravenous  PONV Risk Score and Plan: 1 and Ondansetron and Dexamethasone  Airway Management Planned: Oral  ETT  Additional Equipment:   Intra-op Plan:   Post-operative Plan: Extubation in OR  Informed Consent: I have reviewed the patients History and Physical, chart, labs and discussed the procedure including the risks, benefits and alternatives for the proposed anesthesia with the patient or authorized representative who has indicated his/her understanding and acceptance.     Dental advisory given  Plan Discussed with: CRNA and Anesthesiologist  Anesthesia Plan Comments:         Anesthesia Quick Evaluation

## 2021-04-27 NOTE — Discharge Instructions (Addendum)
  Diet: Resume home heart healthy regular diet.   Activity: No heavy lifting >20 pounds (children, pets, laundry, garbage) or strenuous activity until follow-up, but light activity and walking are encouraged. Do not drive or drink alcohol if taking narcotic pain medications.  Wound care: May shower with soapy water and pat dry (do not rub incisions), but no baths or submerging incision underwater until follow-up. (no swimming)   Medications: Resume all home medications. For mild to moderate pain: acetaminophen (Tylenol) ***or ibuprofen (if no kidney disease). Combining Tylenol with alcohol can substantially increase your risk of causing liver disease. Narcotic pain medications, if prescribed, can be used for severe pain, though may cause nausea, constipation, and drowsiness. Do not combine Tylenol and Norco within a 6 hour period as Norco contains Tylenol. If you do not need the narcotic pain medication, you do not need to fill the prescription.  Call office ([phone]) at any time if any questions, worsening pain, fevers/chills, bleeding, drainage from incision site, or other concerns.   AMBULATORY SURGERY  DISCHARGE INSTRUCTIONS   The drugs that you were given will stay in your system until tomorrow so for the next 24 hours you should not:  Drive an automobile Make any legal decisions Drink any alcoholic beverage   You may resume regular meals tomorrow.  Today it is better to start with liquids and gradually work up to solid foods.  You may eat anything you prefer, but it is better to start with liquids, then soup and crackers, and gradually work up to solid foods.   Please notify your doctor immediately if you have any unusual bleeding, trouble breathing, redness and pain at the surgery site, drainage, fever, or pain not relieved by medication.    Additional Instructions:        Please contact your physician with any problems or Same Day Surgery at [phone], Monday  through Friday 6 am to 4 pm, or [place] [facility] [place] Main number at [phone].  

## 2021-04-27 NOTE — Transfer of Care (Signed)
Immediate Anesthesia Transfer of Care Note  Patient: Devin Buckley  Procedure(s) Performed: XI ROBOTIC ASSISTED BILATERAL INGUINAL HERNIA (Left: Groin) INSERTION OF MESH  Patient Location: PACU  Anesthesia Type:General  Level of Consciousness: awake and drowsy  Airway & Oxygen Therapy: Patient Spontanous Breathing  Post-op Assessment: Report given to RN  Post vital signs: Reviewed and stable  Last Vitals:  Vitals Value Taken Time  BP 111/74 04/27/21 1500  Temp 36.8 C 04/27/21 1500  Pulse 94 04/27/21 1503  Resp 21 04/27/21 1503  SpO2 100 % 04/27/21 1503  Vitals shown include unvalidated device data.  Last Pain:  Vitals:   04/27/21 1500  TempSrc:   PainSc: 0-No pain         Complications: No notable events documented.

## 2021-04-28 ENCOUNTER — Encounter: Payer: Self-pay | Admitting: General Surgery

## 2021-04-28 NOTE — Anesthesia Postprocedure Evaluation (Signed)
Anesthesia Post Note  Patient: EREK KOWAL  Procedure(s) Performed: XI ROBOTIC ASSISTED BILATERAL INGUINAL HERNIA (Left: Groin) INSERTION OF MESH  Patient location during evaluation: PACU Anesthesia Type: General Level of consciousness: combative Pain management: pain level controlled Vital Signs Assessment: post-procedure vital signs reviewed and stable Respiratory status: spontaneous breathing, nonlabored ventilation and respiratory function stable Cardiovascular status: blood pressure returned to baseline and stable Postop Assessment: adequate PO intake Anesthetic complications: no   No notable events documented.   Last Vitals:  Vitals:   04/27/21 1626 04/27/21 1703  BP: 104/67 113/69  Pulse: 79 90  Resp: 16 16  Temp: (!) 36.4 C   SpO2: 96% 97%    Last Pain:  Vitals:   04/27/21 1703  TempSrc: Temporal  PainSc: 3                  Darrin Nipper

## 2021-05-03 ENCOUNTER — Encounter (HOSPITAL_COMMUNITY): Payer: Medicare Other

## 2021-05-03 ENCOUNTER — Other Ambulatory Visit (HOSPITAL_COMMUNITY): Payer: Medicare Other

## 2021-05-09 ENCOUNTER — Other Ambulatory Visit (HOSPITAL_COMMUNITY): Payer: Medicare Other

## 2021-05-10 ENCOUNTER — Other Ambulatory Visit (HOSPITAL_COMMUNITY): Payer: Medicare Other

## 2021-05-10 ENCOUNTER — Other Ambulatory Visit: Payer: Self-pay

## 2021-05-10 ENCOUNTER — Ambulatory Visit (HOSPITAL_COMMUNITY)
Admission: RE | Admit: 2021-05-10 | Discharge: 2021-05-10 | Disposition: A | Discharge status: Home / Self Care | Payer: Medicare Other | Source: Ambulatory Visit | Attending: Internal Medicine | Admitting: Internal Medicine

## 2021-05-10 DIAGNOSIS — R600 Localized edema: Secondary | ICD-10-CM | POA: Diagnosis present

## 2021-05-16 ENCOUNTER — Telehealth: Payer: Self-pay | Admitting: *Deleted

## 2021-05-16 DIAGNOSIS — M79606 Pain in leg, unspecified: Secondary | ICD-10-CM

## 2021-05-16 NOTE — Telephone Encounter (Signed)
Patient notified

## 2021-05-16 NOTE — Telephone Encounter (Signed)
OK to do LE arterial DOpplers bilaterally for leg pain.

## 2021-05-16 NOTE — Telephone Encounter (Signed)
-----   Message from Jettie Booze, MD sent at 05/12/2021  9:23 PM EST ----- No DVT

## 2021-05-16 NOTE — Telephone Encounter (Signed)
I spoke with patient and reviewed results with him. He is asking if doppler study of arteries in his legs can be done.  He reports he has pain in his legs at times when walking and when standing for long periods of time.  Also has L3, L4. L5 issues

## 2021-05-20 ENCOUNTER — Other Ambulatory Visit: Payer: Self-pay | Admitting: Interventional Cardiology

## 2021-05-20 DIAGNOSIS — M79606 Pain in leg, unspecified: Secondary | ICD-10-CM

## 2021-05-26 ENCOUNTER — Ambulatory Visit (HOSPITAL_COMMUNITY)
Admission: RE | Admit: 2021-05-26 | Payer: Medicare Other | Source: Ambulatory Visit | Attending: Interventional Cardiology | Admitting: Interventional Cardiology

## 2021-05-27 ENCOUNTER — Other Ambulatory Visit: Payer: Self-pay

## 2021-05-27 ENCOUNTER — Ambulatory Visit (HOSPITAL_COMMUNITY): Payer: Medicare Other | Attending: Interventional Cardiology

## 2021-05-27 DIAGNOSIS — I350 Nonrheumatic aortic (valve) stenosis: Secondary | ICD-10-CM | POA: Diagnosis present

## 2021-05-27 DIAGNOSIS — I5032 Chronic diastolic (congestive) heart failure: Secondary | ICD-10-CM | POA: Diagnosis present

## 2021-06-02 ENCOUNTER — Encounter: Payer: Self-pay | Admitting: Interventional Cardiology

## 2021-06-02 LAB — ECHOCARDIOGRAM COMPLETE
AR max vel: 1.25 cm2
AV Area VTI: 1.14 cm2
AV Area mean vel: 1.18 cm2
AV Mean grad: 55 mmHg
AV Peak grad: 91.6 mmHg
Ao pk vel: 4.79 m/s
S' Lateral: 3.7 cm

## 2021-06-03 ENCOUNTER — Telehealth: Payer: Self-pay | Admitting: Interventional Cardiology

## 2021-06-03 DIAGNOSIS — I350 Nonrheumatic aortic (valve) stenosis: Secondary | ICD-10-CM

## 2021-06-03 NOTE — Telephone Encounter (Signed)
Patient returning call for echo results. 

## 2021-06-03 NOTE — Telephone Encounter (Signed)
Per Dr. Irish Lack, EF 45-50%. (Normal is 50-65%) Severe aortic stenosis is more significant now.  Mean gradient across the aortic valve increased from 43 to 55 mm Hg. Dilated atria, moderate dilation of the aorta as well, 46 mm.  I would still recommend consultation with a surgeon who could replace the valve as it has started to affect the strength of the pumping function of the heart.  The office will call you as well.  Called patient with results. Patient wanted to compare last year's echo with this echo. Went over both echos with patient. Patient would like to discuss with Dr. Irish Lack before seeing surgeon. Made patient an appointment for the begging of January and put in referral for surgeon. Patient verbalized understanding.

## 2021-06-06 ENCOUNTER — Encounter: Payer: Self-pay | Admitting: Physician Assistant

## 2021-06-23 ENCOUNTER — Encounter: Payer: Self-pay | Admitting: Interventional Cardiology

## 2021-06-23 ENCOUNTER — Ambulatory Visit (INDEPENDENT_AMBULATORY_CARE_PROVIDER_SITE_OTHER): Payer: Medicare Other | Admitting: Interventional Cardiology

## 2021-06-23 ENCOUNTER — Other Ambulatory Visit: Payer: Self-pay

## 2021-06-23 VITALS — BP 108/64 | HR 94 | Ht 74.0 in | Wt 209.0 lb

## 2021-06-23 DIAGNOSIS — I4821 Permanent atrial fibrillation: Secondary | ICD-10-CM

## 2021-06-23 DIAGNOSIS — R600 Localized edema: Secondary | ICD-10-CM

## 2021-06-23 DIAGNOSIS — E782 Mixed hyperlipidemia: Secondary | ICD-10-CM

## 2021-06-23 DIAGNOSIS — I350 Nonrheumatic aortic (valve) stenosis: Secondary | ICD-10-CM

## 2021-06-23 DIAGNOSIS — I5041 Acute combined systolic (congestive) and diastolic (congestive) heart failure: Secondary | ICD-10-CM

## 2021-06-23 DIAGNOSIS — I779 Disorder of arteries and arterioles, unspecified: Secondary | ICD-10-CM

## 2021-06-23 DIAGNOSIS — R0609 Other forms of dyspnea: Secondary | ICD-10-CM

## 2021-06-23 MED ORDER — FUROSEMIDE 40 MG PO TABS: 40.0000 mg | ORAL_TABLET | Freq: Every day | ORAL | 3 refills | Status: DC

## 2021-06-23 NOTE — Patient Instructions (Addendum)
Medication Instructions:  Your physician has recommended you make the following change in your medication: Change furosemide to 40 mg by mouth daily.  Make take extra 40 mg daily as needed for swelling  *If you need a refill on your cardiac medications before your next appointment, please call your pharmacy*   Lab Work: Lab work to be done today--CBC and CMET If you have labs (blood work) drawn today and your tests are completely normal, you will receive your results only by: [place] (if you have MyChart) OR A paper copy in the mail If you have any lab test that is abnormal or we need to change your treatment, we will call you to review the results.   Testing/Procedures: Have Chest X ray done at [place]: At [facility], you and your health needs are our priority.  As part of our continuing mission to provide you with exceptional heart care, we have created designated Provider Care Teams.  These Care Teams include your primary Cardiologist (physician) and Advanced Practice Providers (APPs -  Physician Assistants and Nurse Practitioners) who all work together to provide you with the care you need, when you need it.  We recommend signing up for the patient portal called "MyChart".  Sign up information is provided on this After Visit Summary.  MyChart is used to connect with patients for Virtual Visits (Telemedicine).  Patients are able to view lab/test results, encounter notes, upcoming appointments, etc.  Non-urgent messages can be sent to your provider as well.   To learn more about what you can do with MyChart, go to [email or web address].    Your next appointment:   July 07, 2021  The format for your next appointment:   In Person  Provider:   Melina Copa, PA-C      If MD is not listed, click here to update    :1}    Other Instructions   You have been referred to Dr Cyndia Bent.

## 2021-06-23 NOTE — Progress Notes (Signed)
Cardiology Office Note   Date:  06/23/2021   ID:  Devin Buckley, DOB 09/21/1940, MRN [id number]  PCP:  Rusty Aus, MD    Chief Complaint  Patient presents with   Follow-up   Severe aortic stenosis  Wt Readings from Last 3 Encounters:  06/23/21 209 lb (94.8 kg)  04/27/21 210 lb (95.3 kg)  11/26/20 212 lb 9.6 oz (96.4 kg)       History of Present Illness: Devin Buckley is a 81 y.o. male   With severe aortic stenosis.   He was diagnosed in 2020 with AFib.     First saw him several years ago,: "He had an echo , but does not recall exactly when.  He wanted to have a cardiologist closer to home so he is transferring care to our office."   Eliquis was recommended when I saw him, but he preferred to take a natural herbal Japanese blood thinner.    In Jan 2021, "Borderline rate control.  He is asymptomatic.  We discussed the risk of stroke associate with AFib and age > 22.  I recommended Eliquis.Marland KitchenMarland KitchenCheck echo.  If EF is preserved, will hold off on adding rate control meds since he is active and asymptomatic.  If EF is low, then would have to consider more intensive rate control. "   In Jan 2021, EF was normal but AS was severe. No sx with exercise.    In June 2022, he reported increasing leg swelling over the past 6 months.  He came in to discuss cardiac cath as part of work-up for aortic valve replacement.  He had remained active at that time :"Continues to walk.  He does about 1 mile in 24 minutes on his treadmill.  He does not report chest pain, syncope.  Feels that his breathing is good while he is exercising."    He had an echo in December 2022.  I sent him the following MyChart message: "EF 45-50%. (Normal is 50-65%) Severe aortic stenosis is more significant now.  Mean gradient across the aortic valve increased from 43 to 55 mm Hg. Dilated atria, moderate dilation of the aorta as well, 46 mm.  I would still recommend consultation with a surgeon who could replace the valve as  it has started to affect the strength of the pumping function of the heart. "  He is back to see me prior to an appointment with the surgeon.  He reports some SHOB.  He continues to use Lasix for edema in the legs and up to his penis.    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[place])    Cataract    ED (erectile dysfunction)    Edema    Gout    History of kidney stones    Hormone disorder    Hyperlipidemia    Hyperlipidemia    Lumbar disc disease    Nephrolithiasis    Patellofemoral arthritis     Past Surgical History:  Procedure Laterality Date   ACHILLES TENDON REPAIR Left    arthroscopic shoulder  Right    BROW LIFT Bilateral 04/08/2018   Procedure: BLEPHAROPLASTY BILATERAL LOWER LID;  Surgeon: Bea Graff, MD;  Location: [place];  Service: Plastics;  Laterality: Bilateral;   INSERTION OF MESH  04/27/2021   Procedure: INSERTION OF MESH;  Surgeon: Herbert Pun, MD;  Location: [facility];  Service: General;;   RHYTIDECTOMY N/A 04/08/2018  Procedure: RHYTIDECTOMY;  Surgeon: Bea Graff, MD;  Location: [facility];  Service: Plastics;  Laterality: N/A;     Current Outpatient Medications  Medication Sig Dispense Refill   buprenorphine-naloxone (SUBOXONE) 8-2 mg SUBL SL tablet Place 1.5 tablets under the tongue every evening.     clonazePAM (KLONOPIN) 0.5 MG tablet Take 0.5 mg by mouth 2 (two) times daily as needed for anxiety.     COLLAGEN PO Take 2 Scoops by mouth 3 (three) times daily.     Digestive Enzyme CAPS Take 3 capsules by mouth 3 (three) times daily.     hydrocortisone 2.5 % cream Apply 1 application topically daily as needed for rash.     Multiple Vitamin (MULTIVITAMIN WITH MINERALS) TABS tablet Take 3 tablets by mouth daily. Total minerals     OVER THE COUNTER MEDICATION Take 3 capsules by mouth daily. Phase 2 otc supplement     Probiotic Product (PROBIOTIC PO) Take 3 capsules by mouth 3 (three) times daily. total  flora     VITAMIN D-VITAMIN K PO Take 1 capsule by mouth daily.     No current facility-administered medications for this visit.    Allergies:   Patient has no known allergies.    Social History:  The patient  reports that he has never smoked. He has never used smokeless tobacco. He reports that he does not drink alcohol and does not use drugs.   Family History:  The patient's family history includes Alzheimer's disease in his father; CVA in his father; Colon cancer in his mother; Diabetes in his brother.    ROS:  Please see the history of present illness.   Otherwise, review of systems are positive for DOE.   All other systems are reviewed and negative.    PHYSICAL EXAM: VS:  BP 108/64    Pulse 94    Ht _0  (1.88 m)    Wt 209 lb (94.8 kg)    SpO2 98%    BMI 26.83 kg/m  , BMI Body mass index is 26.83 kg/m. GEN: Well nourished, well developed, in no acute distress HEENT: normal Neck: no JVD, carotid bruits, or masses Cardiac: irregularly irregular; no murmurs, rubs, or gallops,bilateral LE pitting edema  Respiratory:  decreased breath sounds at right baes, normal work of breathing GI: soft, nontender, nondistended, + BS MS: no deformity or atrophy Skin: warm and dry, no rash Neuro:  Strength and sensation are intact Psych: euthymic mood, full affect   EKG:   The ekg ordered today demonstrates AFib, rate controlled, PVC   Recent Labs: No results found for requested labs within last 8760 hours.   Lipid Panel No results found for: CHOL, TRIG, HDL, CHOLHDL, VLDL, LDLCALC, LDLDIRECT   Other studies Reviewed: Additional studies/ records that were reviewed today with results demonstrating: labs reviewed.   ASSESSMENT AND PLAN:  Aortic stenosis: He has symptomatic, severe aortic stenosis.  He now has evidence of decrease in his LV function.  I again recommended right and left heart catheterization with referral to cardiac surgery for aortic valve replacement.  He wants to  see the surgeon prior to doing cardiac cath.  I explained to him that the surgeon may not be able to make a recommendation about surgical aortic valve replacement versus TAVR without the catheterization information.  This is a similar discussion to what we have that in the past as his aortic stenosis has progressed.  I again explained that delaying treatment of his aortic valve stenosis could lead  to him getting significantly sicker.  He continues to have dyspnea on exertion.  We will also check BNP. Combined systolic and diastolic heart failure: Okay to increase Lasix dose to 40 to 80 mg daily depending on his swelling.  We will check be met today.  Decreased breath sounds at the right base.  I suspect he has a pleural effusion.  Will check chest x-ray Atrial fibrillation: Rate controlled.  He does not want to take traditional anticoagulation.  He has tried herbal medicines for stroke prevention in the setting of his atrial fibrillation.  Perhaps, surgical management of his left atrial appendage may influence what type of aortic replacement he has, if he agrees. Lower extremity edema: He has had volume overload in the past.  Prior lower extremity venous duplex was normal.  Edema now extending up to his thighs and penis.  Will increase dose of Lasix as noted above.   Current medicines are reviewed at length with the patient today.  The patient concerns regarding his medicines were addressed.  The following changes have been made: Lasix dose increased  Labs/ tests ordered today include: C-Met, CBC, chest x-ray No orders of the defined types were placed in this encounter.   Recommend 150 minutes/week of aerobic exercise Low fat, low carb, high fiber diet recommended  Disposition:   FU in 2 weeks   Signed, Larae Grooms, MD  06/23/2021 2:58 PM    [facility] [place], [place], [place]  [address] Phone: [phone]; Fax: [phone]

## 2021-06-24 ENCOUNTER — Telehealth: Payer: Self-pay | Admitting: Interventional Cardiology

## 2021-06-24 LAB — COMPREHENSIVE METABOLIC PANEL
ALT: 23 IU/L (ref 0–44)
AST: 49 IU/L — ABNORMAL HIGH (ref 0–40)
Albumin/Globulin Ratio: 1.5 (ref 1.2–2.2)
Albumin: 3.2 g/dL — ABNORMAL LOW (ref 3.7–4.7)
Alkaline Phosphatase: 229 IU/L — ABNORMAL HIGH (ref 44–121)
BUN/Creatinine Ratio: 28 — ABNORMAL HIGH (ref 10–24)
BUN: 26 mg/dL (ref 8–27)
Bilirubin Total: 0.8 mg/dL (ref 0.0–1.2)
CO2: 29 mmol/L (ref 20–29)
Calcium: 8.5 mg/dL — ABNORMAL LOW (ref 8.6–10.2)
Chloride: 102 mmol/L (ref 96–106)
Creatinine, Ser: 0.94 mg/dL (ref 0.76–1.27)
Globulin, Total: 2.1 g/dL (ref 1.5–4.5)
Glucose: 93 mg/dL (ref 70–99)
Potassium: 4.3 mmol/L (ref 3.5–5.2)
Sodium: 143 mmol/L (ref 134–144)
Total Protein: 5.3 g/dL — ABNORMAL LOW (ref 6.0–8.5)
eGFR: 82 mL/min/{1.73_m2} (ref >59)

## 2021-06-24 LAB — CBC
Hematocrit: 45.1 % (ref 37.5–51.0)
Hemoglobin: 15.2 g/dL (ref 13.0–17.7)
MCH: 32.5 pg (ref 26.6–33.0)
MCHC: 33.7 g/dL (ref 31.5–35.7)
MCV: 96 fL (ref 79–97)
Platelets: 107 10*3/uL — ABNORMAL LOW (ref 150–450)
RBC: 4.68 x10E6/uL (ref 4.14–5.80)
RDW: 13 % (ref 11.6–15.4)
WBC: 5.7 10*3/uL (ref 3.4–10.8)

## 2021-06-24 NOTE — Telephone Encounter (Signed)
Noted. Pt seen in clinic yesterday by Dr. Irish Lack.

## 2021-06-24 NOTE — Telephone Encounter (Signed)
Patent had a screening with [facility] earlier in the week (before his visit with Dr. Irish Lack). He got the preliminary results back that showed Afib in the upper chamber. He wanted to make Dr. Irish Lack aware of the preliminary results. They will not get the complete results back for two weeks.

## 2021-06-24 NOTE — Telephone Encounter (Signed)
Phone call to pt and advised Dr Irish Lack is not in the office today but will forward to make providers aware.  Pt is scheduled to See Melina Copa, PA-C on 07/07/2021 and Dr Irish Lack on 09/01/2021.

## 2021-06-27 ENCOUNTER — Other Ambulatory Visit: Payer: Self-pay

## 2021-06-27 ENCOUNTER — Ambulatory Visit
Admission: RE | Admit: 2021-06-27 | Discharge: 2021-06-27 | Disposition: A | Discharge status: Home / Self Care | Payer: Medicare Other | Source: Ambulatory Visit | Attending: Interventional Cardiology | Admitting: Interventional Cardiology

## 2021-06-27 DIAGNOSIS — R0609 Other forms of dyspnea: Secondary | ICD-10-CM

## 2021-06-27 DIAGNOSIS — R600 Localized edema: Secondary | ICD-10-CM

## 2021-06-28 ENCOUNTER — Telehealth: Payer: Self-pay | Admitting: *Deleted

## 2021-06-28 DIAGNOSIS — I350 Nonrheumatic aortic (valve) stenosis: Secondary | ICD-10-CM

## 2021-06-28 DIAGNOSIS — R0609 Other forms of dyspnea: Secondary | ICD-10-CM

## 2021-06-28 NOTE — Telephone Encounter (Signed)
Patient notified.  He is seeing Melina Copa, PA on 07/07/21 and will have lab work done at that time.

## 2021-06-28 NOTE — Telephone Encounter (Signed)
Left message to call office

## 2021-06-28 NOTE — Telephone Encounter (Signed)
-----   Message from Jettie Booze, MD sent at 06/28/2021 10:07 AM EST ----- Fluid at the right base.  WOuld take Lasix 80 mg daily for 3 days, then can go back to 40-80 mg as already ordered.  Not sure if he has lab appt, but we can recheck BMet in 1-2 weeks.

## 2021-06-28 NOTE — Telephone Encounter (Signed)
Pt is returning call.  

## 2021-07-06 NOTE — Progress Notes (Signed)
Office Visit    Patient Name: Devin Buckley Date of Encounter: 07/07/2021  Primary Care Provider:  Rusty Aus, MD Primary Cardiologist:  Larae Grooms, MD  Chief Complaint    81 year old male with a history of permanent atrial fibrillation (rate controlled, has repeatedly declined DOAC), severe AS, chronic combined systolic and diastolic heart failure, mild carotid artery disease, hyperlipidemia, gout, and anxiety who presents for follow-up related to severe aortic stenosis and heart failure.  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[place])    Cataract    ED (erectile dysfunction)    Edema    Gout    History of kidney stones    Hormone disorder    Hyperlipidemia    Hyperlipidemia    Lumbar disc disease    Nephrolithiasis    Patellofemoral arthritis    Past Surgical History:  Procedure Laterality Date   ACHILLES TENDON REPAIR Left    arthroscopic shoulder  Right    BROW LIFT Bilateral 04/08/2018   Procedure: BLEPHAROPLASTY BILATERAL LOWER LID;  Surgeon: Bea Graff, MD;  Location: [facility];  Service: Plastics;  Laterality: Bilateral;   INSERTION OF MESH  04/27/2021   Procedure: INSERTION OF MESH;  Surgeon: Herbert Pun, MD;  Location: [facility];  Service: General;;   RHYTIDECTOMY N/A 04/08/2018   Procedure: Malon Kindle;  Surgeon: Bea Graff, MD;  Location: [facility];  Service: Plastics;  Laterality: N/A;    Allergies  No Known Allergies  History of Present Illness    81 year old male with the above past medical history including permanent atrial fibrillation (rate controlled, has repeatedly declined DOAC), severe AS, chronic combined systolic and diastolic heart failure, pulmonary hypertension, mild carotid artery disease, thrombocytopenia, chronically elevated LFTs, hyperlipidemia, gout, and anxiety.  He was diagnosed with atrial fibrillation in 2020.  He has repeatedly declined  DOAC therapy and prefers to take a natural herbal Japanese blood thinner. Echocardiogram in January 2021 showed EF 55%, moderate BAE, severe aortic stenosis, mean gradient 41 mmHg, and mild dilation of ascending aorta measuring 42 mm.  He was asymptomatic at the time with daily exercise. Carotid Dopplers and 2021 showed 1 to 39% B ICA stenosis. Repeat echocardiogram in October 2021 showed an EF of 45 to 50%, mildly decreased LV function, mild LVH, moderately reduced RV systolic function, severe BAE, mild AR, and severe aortic stenosis, stable dilation of ascending aorta.  He was seen in June 2022 and reported increasing lower extremity edema.  He continued to report good exercise tolerance at the time.  Most recent echocardiogram in December 2022 showed  EF 45 to 50%, worsening severe aortic stenosis, mean gradient increased from 43 to 55 mmHg, severe BAE, and moderate dilation of ascending aorta, measuring 46 mm.  Surgery consult was recommended at the time, to which he agreed, however, he declined cardiac catheterization prior to surgery consult.     He was last seen in the office on 06/23/2021 and was noted to have increased lower extremity edema, as well as decreased breath sounds in the right lower lobe. Chest x-ray showed small R pleural effusion and his Lasix was increased to 80 mg daily for 3 days, followed by resumption of Lasix 40 mg daily with an additional 40 mg daily as needed for swelling. Of note, he has an appointment scheduled with Dr. Cyndia Bent on 07/14/2021 discussed surgical repair for severe aortic stenosis.  He  presents today for follow-up. Since his last visit he reports stable dyspnea on exertion, stable bilateral lower extremity edema.  He states that following his visit with Dr. Irish Lack on 06/23/2021 he took Lasix for 3 days as instructed, but stopped taking his Lasix after that. He states he misunderstood the instructions. He is no longer exercising on the treadmill and is instead is lifting  light weights and doing chair exercises with his wife at home. He thinks that some of his activity limitation is due to chronic lumbar back pain, though he does think overall, he is more short of breath with activity.  He reports rare fleeting palpitations, denies dizziness, presyncope, or syncope or any symptoms concerning for angina. He is planning to meet with Dr. Caffie Pinto on 07/14/2021 as scheduled.   Home Medications    Current Outpatient Medications  Medication Sig Dispense Refill   apixaban (ELIQUIS) 5 MG TABS tablet Take 1 tablet (5 mg total) by mouth 2 (two) times daily. 60 tablet 11   buprenorphine-naloxone (SUBOXONE) 8-2 mg SUBL SL tablet Place 2.5 tablets under the tongue as needed.     clonazePAM (KLONOPIN) 0.5 MG tablet Take 0.5 mg by mouth 2 (two) times daily as needed for anxiety.     COLLAGEN PO Take 2 Scoops by mouth 3 (three) times daily.     Digestive Enzyme CAPS Take 3 capsules by mouth 3 (three) times daily.     furosemide (LASIX) 40 MG tablet Take 1.5 tablets (60 mg total) by mouth daily. 135 tablet 3   hydrocortisone 2.5 % cream Apply 1 application topically daily as needed for rash.     Multiple Vitamin (MULTIVITAMIN WITH MINERALS) TABS tablet Take 3 tablets by mouth daily. Total minerals     OVER THE COUNTER MEDICATION Take 3 capsules by mouth daily. Phase 2 otc supplement. Per patient this is a mineral     Probiotic Product (PROBIOTIC PO) Take 3 capsules by mouth 3 (three) times daily. total flora     VITAMIN D-VITAMIN K PO Take 1 capsule by mouth daily.     No current facility-administered medications for this visit.     Review of Systems    He denies chest pain, pnd, orthopnea, n, v, dizziness, syncope, weight gain, or early satiety. All other systems reviewed and are otherwise negative except as noted above.   Physical Exam    VS:  BP 110/82    Pulse 84    Ht 6\' 2"  (1.88 m)    Wt 204 lb 9.6 oz (92.8 kg)    SpO2 97%    BMI 26.27 kg/m  GEN: Well nourished, well  developed, in no acute distress. HEENT: normal. Neck: Supple, no JVD, carotid bruits, or masses. Cardiac: RRR, 3/6 murmurs, no rubs, or gallops. No clubbing, cyanosis. 1+ pitting bilateral lower extremity edema.  Radials/DP/PT 2+ and equal bilaterally.  Respiratory:  Respirations regular and unlabored, RLL fine crackles, otherwise clear to auscultation. GI: Soft, nontender, nondistended, BS + x 4. MS: no deformity or atrophy. Skin: warm and dry, no rash. Neuro:  Strength and sensation are intact. Psych: Normal affect.  Accessory Clinical Findings    ECG personally reviewed by me today - No EKG in office today.  Lab Results  Component Value Date   WBC 5.7 06/23/2021   HGB 15.2 06/23/2021   HCT 45.1 06/23/2021   MCV 96 06/23/2021   PLT 107 (L) 06/23/2021   Lab Results  Component Value Date   CREATININE 0.94 06/23/2021  BUN 26 06/23/2021   NA 143 06/23/2021   K 4.3 06/23/2021   CL 102 06/23/2021   CO2 29 06/23/2021   Lab Results  Component Value Date   ALT 23 06/23/2021   AST 49 (H) 06/23/2021   ALKPHOS 229 (H) 06/23/2021   BILITOT 0.8 06/23/2021   No results found for: CHOL, HDL, LDLCALC, LDLDIRECT, TRIG, CHOLHDL  No results found for: HGBA1C  Assessment & Plan    1. Severe aortic stenosis: Initially diagnosed on echo in 2021 . Most recent echo in December 2022 showed EF 45 to 50%, worsening severe aortic stenosis, mean gradient increased from 43 to 55 mmHg, severe BAE, and moderate dilation of ascending aorta, measuring 46 mm.he is now symptomatic with increased dyspnea on exertion and bilateral lower extremity edema, recent pleural effusion on chest x-ray. We had a long conversation discussing the progressive and potentially fatal nature of severe AS if untreated. He he seems to now understand the severity of this diagnosis and is eager to discuss options for surgical repair with Dr. Cyndia Bent at his appointment on 07/14/2021. He has declined to proceed with cardiac  catheterization prior to speaking with surgeon. Plan for Lasix as below.  2. Chronic combined systolic and diastolic heart failure/ pulmonary HTN: Recent echo as above.  Since since he was seen on 06/23/2021 he took 80 mg of Lasix for 3 days, but has not taken any more Lasix since.  He states he misunderstood the directions. He does have fine crackles in the right lower lobe on exam.  1+ pitting bilateral lower extremity edema. He does report mildly increased dyspnea on exertion. His weight is actually down from last visit (209 >> 204 lb). Will resume Lasix at dose of 60 mg daily.  Discussed daily weights. Check CMET today, with close follow-up.  If fluid volume status seemingly not improved at follow-up visit, can consider repeat chest x-ray, escalation of diuretic at that time. He will need a repeat BMET at follow-up. Regarding pulmonary hypertension, suspect this is sequale of heart failure in the setting of severe AS. Would be best evaluated with [facility], however, will defer until plans for surgical evaluation clarified. Continue to manage volume as outlined.   3. Permanent atrial fibrillation: Diagnosed in 2020.  Rate controlled. He has repeatedly declined DOAC therapy and prefers to take a natural herbal Japanese blood thinner. I  discussed the risk of stroke with atrial fibrillation without DOAC therapy. He now agrees to start Eliquis 5 mg twice daily.  I advised him to no longer take any over-the-counter blood thinners. He verbalized understanding.  Start Eliquis 5 mg twice daily.   4. Mild carotid artery stenosis: Carotid Dopplers and 2021 showed 1 to 39% B ICA stenosis.  No bruit on exam.  Asymptomatic.  Consider repeat Dopplers in 1-2 years.   5. Thrombocytopenia, chronically elevated LFTs: Recent CBC was stable, platelets 107.  Also has chronically elevated LFTs.  We will repeat CMET today as above.  Continue to monitor with addition of Eliquis.   6. Hyperlipidemia: LDL 109 in 02/2021. Monitored and  managed by PCP.  He is not on statin given history of elevated LFTs.  7. 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 He is not doing any heavy lifting. His aorta needs further evaluation to discern the size, he will be seeing cardiothoracic surgery for further input on surgical plans. If the decision is made to proceed with TAVR, he will undergo CT scanning  which will clarify aortic size. However, if he does not wish to undergo surgical evaluation, we will need to consider arranging CT scan at next visit. Recent chest x-ray showed opacity at R lung base with recommendation for follow-up imaging. Recommend attention to this at follow-up OV, once plan for surgical evaluation clarified.   8. Disposition: Follow-up with Dr. Cyndia Bent as scheduled.  Follow-up in clinic in 2 weeks.  Lenna Sciara, NP 07/07/2021, 1:51 PM

## 2021-07-07 ENCOUNTER — Encounter: Payer: Self-pay | Admitting: Physician Assistant

## 2021-07-07 ENCOUNTER — Other Ambulatory Visit: Payer: Medicare Other

## 2021-07-07 ENCOUNTER — Other Ambulatory Visit: Payer: Self-pay

## 2021-07-07 ENCOUNTER — Ambulatory Visit (INDEPENDENT_AMBULATORY_CARE_PROVIDER_SITE_OTHER): Payer: Medicare Other | Admitting: Nurse Practitioner

## 2021-07-07 VITALS — BP 110/82 | HR 84 | Ht 74.0 in | Wt 204.6 lb

## 2021-07-07 DIAGNOSIS — I272 Pulmonary hypertension, unspecified: Secondary | ICD-10-CM | POA: Diagnosis not present

## 2021-07-07 DIAGNOSIS — I4821 Permanent atrial fibrillation: Secondary | ICD-10-CM | POA: Diagnosis not present

## 2021-07-07 DIAGNOSIS — I350 Nonrheumatic aortic (valve) stenosis: Secondary | ICD-10-CM | POA: Diagnosis not present

## 2021-07-07 DIAGNOSIS — D696 Thrombocytopenia, unspecified: Secondary | ICD-10-CM

## 2021-07-07 DIAGNOSIS — E782 Mixed hyperlipidemia: Secondary | ICD-10-CM

## 2021-07-07 DIAGNOSIS — I779 Disorder of arteries and arterioles, unspecified: Secondary | ICD-10-CM

## 2021-07-07 DIAGNOSIS — I5042 Chronic combined systolic (congestive) and diastolic (congestive) heart failure: Secondary | ICD-10-CM

## 2021-07-07 DIAGNOSIS — R7989 Other specified abnormal findings of blood chemistry: Secondary | ICD-10-CM

## 2021-07-07 LAB — COMPREHENSIVE METABOLIC PANEL
ALT: 25 IU/L (ref 0–44)
AST: 45 IU/L — ABNORMAL HIGH (ref 0–40)
Albumin/Globulin Ratio: 1.6 (ref 1.2–2.2)
Albumin: 3.2 g/dL — ABNORMAL LOW (ref 3.7–4.7)
Alkaline Phosphatase: 178 IU/L — ABNORMAL HIGH (ref 44–121)
BUN/Creatinine Ratio: 29 — ABNORMAL HIGH (ref 10–24)
BUN: 29 mg/dL — ABNORMAL HIGH (ref 8–27)
Bilirubin Total: 0.9 mg/dL (ref 0.0–1.2)
CO2: 30 mmol/L — ABNORMAL HIGH (ref 20–29)
Calcium: 9 mg/dL (ref 8.6–10.2)
Chloride: 101 mmol/L (ref 96–106)
Creatinine, Ser: 1 mg/dL (ref 0.76–1.27)
Globulin, Total: 2 g/dL (ref 1.5–4.5)
Glucose: 110 mg/dL — ABNORMAL HIGH (ref 70–99)
Potassium: 4.3 mmol/L (ref 3.5–5.2)
Sodium: 140 mmol/L (ref 134–144)
Total Protein: 5.2 g/dL — ABNORMAL LOW (ref 6.0–8.5)
eGFR: 77 mL/min/{1.73_m2} (ref >59)

## 2021-07-07 MED ORDER — APIXABAN 5 MG PO TABS: 5.0000 mg | ORAL_TABLET | Freq: Two times a day (BID) | ORAL | 11 refills | Status: DC

## 2021-07-07 MED ORDER — FUROSEMIDE 40 MG PO TABS: 60.0000 mg | ORAL_TABLET | Freq: Every day | ORAL | 3 refills | Status: DC

## 2021-07-07 NOTE — Patient Instructions (Addendum)
Medication Instructions:  Your physician has recommended you make the following change in your medication:   START Eliquis 5 mg taking 1 tablet twice a day  2.    INCREASE the Lasix to 40 mg taking 1 1/2 tablet daily   *If you need a refill on your cardiac medications before your next appointment, please call your pharmacy*   Lab Work: TODAY:  CMET  If you have labs (blood work) drawn today and your tests are completely normal, you will receive your results only by: [place] (if you have MyChart) OR A paper copy in the mail If you have any lab test that is abnormal or we need to change your treatment, we will call you to review the results.   Testing/Procedures: None ordered   Follow-Up: At [facility], you and your health needs are our priority.  As part of our continuing mission to provide you with exceptional heart care, we have created designated Provider Care Teams.  These Care Teams include your primary Cardiologist (physician) and Advanced Practice Providers (APPs -  Physician Assistants and Nurse Practitioners) who all work together to provide you with the care you need, when you need it.  We recommend signing up for the patient portal called "MyChart".  Sign up information is provided on this After Visit Summary.  MyChart is used to connect with patients for Virtual Visits (Telemedicine).  Patients are able to view lab/test results, encounter notes, upcoming appointments, etc.  Non-urgent messages can be sent to your provider as well.   To learn more about what you can do with MyChart, go to [email or web address].    Your next appointment:   2 WEEKS   07/22/21 ARRIVE AT 2:00 TO SEE DR. VARANASI   The format for your next appointment:     Provider:       Other Instructions

## 2021-07-14 ENCOUNTER — Institutional Professional Consult (permissible substitution) (INDEPENDENT_AMBULATORY_CARE_PROVIDER_SITE_OTHER): Payer: Medicare Other | Admitting: Surgery

## 2021-07-14 ENCOUNTER — Other Ambulatory Visit: Payer: Self-pay

## 2021-07-14 ENCOUNTER — Encounter: Payer: Self-pay | Admitting: Surgery

## 2021-07-14 VITALS — BP 120/73 | HR 100 | Resp 20 | Ht 74.0 in | Wt 204.0 lb

## 2021-07-14 DIAGNOSIS — I350 Nonrheumatic aortic (valve) stenosis: Secondary | ICD-10-CM

## 2021-07-14 NOTE — Progress Notes (Signed)
Patient ID: Devin Buckley, male   DOB: Apr 06, 1941, 81 y.o.   MRN: [id number]  [facility]IP[facility] VALVE CLINIC        [address]       [place] [address]             [phone]          CARDIOTHORACIC SURGERY CONSULTATION REPORT  PCP is Rusty Aus, MD Referring Provider is Casandra Doffing, MD Primary Cardiologist is Larae Grooms, MD  Reason for consultation:  Severe aortic stenosis  HPI:  The patient is a 81 year old gentleman with a history of hyperlipidemia, permanent atrial fibrillation with rate control recently on Eliquis, severe aortic stenosis, and chronic combined systolic and diastolic heart failure.  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  He was asymptomatic at that time with daily exercise and continued follow-up was recommended.  A repeat echocardiogram in October 2021 showed a drop in his EF to 45 to 50% with mild LVH and moderate RV systolic dysfunction.  The mean gradient across the aortic valve was 43 mmHg with a peak gradient of 68 mmHg.  Aortic valve area was 0.69 cm.  He continues to remain asymptomatic with good exercise tolerance until June 2022 when he reported increasing lower extremity edema.  His most recent echocardiogram in December 2022 showed an ejection fraction of 45 to 50% with an increase in the mean gradient across the aortic valve to 55 mmHg with the ascending aorta increasing in size to 4.6 cm.  He reports a several month history of increased lower extremity edema, fatigue, and mild shortness of breath with exertion.  He was seen by cardiology in January and noted to have increased lower extremity edema and a small right pleural effusion on chest x-ray.  His Lasix was increased to 80 mg daily for 3 days with resumption of 40 mg/day after that.  He denies any  dizziness or syncope.  He has had no orthopnea or PND.  He denies any chest pain or pressure.  He is here today with his significant other.  He continues to work in [employer].  Past Medical History:  Diagnosis Date   Afib ([place])    Anxiety    Aortic atherosclerosis ([place])    Cataract    ED (erectile dysfunction)    Edema    Gout    History of kidney stones    Hormone disorder    Hyperlipidemia    Hyperlipidemia    Lumbar disc disease    Nephrolithiasis    Patellofemoral arthritis     Past Surgical History:  Procedure Laterality Date   ACHILLES TENDON REPAIR Left    arthroscopic shoulder  Right    BROW LIFT Bilateral 04/08/2018   Procedure: BLEPHAROPLASTY BILATERAL LOWER LID;  Surgeon: Bea Graff, MD;  Location: [facility];  Service: Plastics;  Laterality: Bilateral;   INSERTION OF MESH  04/27/2021   Procedure: INSERTION OF MESH;  Surgeon: Herbert Pun, MD;  Location: [facility];  Service: General;;   RHYTIDECTOMY N/A 04/08/2018   Procedure: Malon Kindle;  Surgeon: Bea Graff, MD;  Location: [facility];  Service: Plastics;  Laterality: N/A;    Family History  Problem Relation Age of Onset   Colon cancer Mother    Alzheimer's disease Father    CVA  Father    Diabetes Brother     Social History   Socioeconomic History   Marital status: Single    Spouse name: Not on file   Number of children: Not on file   Years of education: Not on file   Highest education level: Not on file  Occupational History   Not on file  Tobacco Use   Smoking status: Never   Smokeless tobacco: Never  Vaping Use   Vaping Use: Never used  Substance and Sexual Activity   Alcohol use: No   Drug use: No   Sexual activity: Never  Other Topics Concern   Not on file  Social History Narrative   Not on file   Social Determinants of Health   Financial Resource Strain: Not on file  Food Insecurity: Not on file  Transportation Needs: Not on file  Physical Activity: Not on file   Stress: Not on file  Social Connections: Not on file  Intimate Partner Violence: Not on file    Prior to Admission medications   Medication Sig Start Date End Date Taking? Authorizing Provider  apixaban (ELIQUIS) 5 MG TABS tablet Take 1 tablet (5 mg total) by mouth 2 (two) times daily. 07/07/21  Yes Monge, Helane Gunther, NP  buprenorphine-naloxone (SUBOXONE) 8-2 mg SUBL SL tablet Place 2.5 tablets under the tongue as needed.   Yes [provider]  clonazePAM (KLONOPIN) 0.5 MG tablet Take 0.5 mg by mouth 2 (two) times daily as needed for anxiety. 08/11/16  Yes [provider]  COLLAGEN PO Take 2 Scoops by mouth 3 (three) times daily.   Yes [provider]  Digestive Enzyme CAPS Take 3 capsules by mouth 3 (three) times daily.   Yes [provider]  furosemide (LASIX) 40 MG tablet Take 1.5 tablets (60 mg total) by mouth daily. 07/07/21 10/05/21 Yes Monge, Helane Gunther, NP  hydrocortisone 2.5 % cream Apply 1 application topically daily as needed for rash. 04/15/21  Yes [provider]  Multiple Vitamin (MULTIVITAMIN WITH MINERALS) TABS tablet Take 3 tablets by mouth daily. Total minerals   Yes [provider]  OVER THE COUNTER MEDICATION Take 3 capsules by mouth daily. Phase 2 otc supplement. Per patient this is a mineral   Yes [provider]  Probiotic Product (PROBIOTIC PO) Take 3 capsules by mouth 3 (three) times daily. total flora   Yes [provider]  VITAMIN D-VITAMIN K PO Take 1 capsule by mouth daily.   Yes [provider]    Current Outpatient Medications  Medication Sig Dispense Refill   apixaban (ELIQUIS) 5 MG TABS tablet Take 1 tablet (5 mg total) by mouth 2 (two) times daily. 60 tablet 11   buprenorphine-naloxone (SUBOXONE) 8-2 mg SUBL SL tablet Place 2.5 tablets under the tongue as needed.     clonazePAM (KLONOPIN) 0.5 MG tablet Take 0.5 mg by mouth 2 (two) times daily as needed for anxiety.     COLLAGEN PO Take  2 Scoops by mouth 3 (three) times daily.     Digestive Enzyme CAPS Take 3 capsules by mouth 3 (three) times daily.     furosemide (LASIX) 40 MG tablet Take 1.5 tablets (60 mg total) by mouth daily. 135 tablet 3   hydrocortisone 2.5 % cream Apply 1 application topically daily as needed for rash.     Multiple Vitamin (MULTIVITAMIN WITH MINERALS) TABS tablet Take 3 tablets by mouth daily. Total minerals     OVER THE COUNTER MEDICATION Take 3  capsules by mouth daily. Phase 2 otc supplement. Per patient this is a mineral     Probiotic Product (PROBIOTIC PO) Take 3 capsules by mouth 3 (three) times daily. total flora     VITAMIN D-VITAMIN K PO Take 1 capsule by mouth daily.     No current facility-administered medications for this visit.    No Known Allergies    Review of Systems:   General:  normal appetite, + decreased energy, no weight gain, + weight loss, no fever  Cardiac:  no chest pain with exertion, no chest pain at rest, +SOB with moderate exertion, no resting SOB, no PND, no orthopnea, no palpitations, + arrhythmia, + atrial fibrillation, + LE edema, no dizzy spells, no syncope  Respiratory:  + exertional shortness of breath, no home oxygen, no productive cough, no dry cough, no bronchitis, no wheezing, no hemoptysis, no asthma, no pain with inspiration or cough, no sleep apnea, no CPAP at night  GI:   no difficulty swallowing, no reflux, no frequent heartburn, no hiatal hernia, no abdominal pain, no constipation, no diarrhea, no hematochezia, no hematemesis, no melena  GU:   no dysuria,  no frequency, no urinary tract infection, no hematuria, no enlarged prostate, + kidney stones, no kidney disease  Vascular:  no pain suggestive of claudication, + pain in feet, no leg cramps, no varicose veins, no DVT, no non-healing foot ulcer  Neuro:   no stroke, no TIA's, no seizures, no headaches, no temporary blindness one eye,  no slurred speech, no peripheral neuropathy, no chronic pain, no  instability of gait, no memory/cognitive dysfunction  Musculoskeletal: no arthritis, mp joint swelling, mp myalgias, mp difficulty walking, normal mobility   Skin:   no rash, no itching, no skin infections, no pressure sores or ulcerations  Psych:   no anxiety, no depression, no nervousness, no unusual recent stress  Eyes:   no blurry vision, no floaters, no recent vision changes, + wears glasses   ENT:   no hearing loss, no loose or painful teeth, no dentures, last saw dentist every 6 months  Hematologic:  + easy bruising, no abnormal bleeding, no clotting disorder, no frequent epistaxis  Endocrine:  no diabetes, does not check CBG's at home     Physical Exam:   BP 120/73    Pulse 100    Resp 20    Ht 6\' 2"  (1.88 m)    Wt 204 lb (92.5 kg)    SpO2 96% Comment: RA   BMI 26.19 kg/m   General:  Fit and well-appearing  HEENT:  Unremarkable, NCAT, PERLA, EOMI  Neck:   no JVD, left cervical bruit or transmitted murmur, no adenopathy   Chest:   clear to auscultation, symmetrical breath sounds, no wheezes, no rhonchi   CV:   RRR, 3/6 systolic murmur RSB, no diastolic murmur  Abdomen:  soft, non-tender, no masses   Extremities:  warm, well-perfused, pulses not palpable at ankle, + lower extremity edema  Rectal/GU  Deferred  Neuro:   Grossly non-focal and symmetrical throughout  Skin:   Clean and dry, no rashes, no breakdown  Diagnostic Tests:      ECHOCARDIOGRAM REPORT         Patient Name:   RAYNE LOISEAU Date of Exam: 05/27/2021  Medical Rec #:  [id number]     Height:       74.0 in  Accession #:    [id number]    Weight:       210.0 lb  Date of Birth:  04/30/1941      BSA:          2.219 m  Patient Age:    39 years      BP:           114/62 mmHg  Patient Gender: M             HR:           81 bpm.  Exam Location:  [facility]   Procedure: 2D Echo, Cardiac Doppler, Color Doppler and Strain Analysis   Indications:    I35 Aortic stenosis     History:        Patient has prior history  of Echocardiogram examinations,  most                  recent 04/13/2020. Aortic Valve Disease, Arrythmias:Atrial                  Fibrillation, Signs/Symptoms:Edema, Shortness of Breath  and                  Murmur; Risk Factors:Dyslipidemia.     Sonographer:    Basilia Jumbo BS, RDCS  Referring Phys: George     1. Compared to previous study, AS is worse (mean gradient now 55 mmHg).   2. Left ventricular ejection fraction, by estimation, is 45 to 50%. The  left ventricle has mildly decreased function. The left ventricle  demonstrates global hypokinesis. The left ventricular internal cavity size  was mildly dilated. There is mild left  ventricular hypertrophy. Left ventricular diastolic function could not be  evaluated. The average left ventricular global longitudinal strain is  -13.8 %. The global longitudinal strain is abnormal.   3. Right ventricular systolic function is normal. The right ventricular  size is moderately enlarged. There is severely elevated pulmonary artery  systolic pressure.   4. Left atrial size was severely dilated.   5. Right atrial size was severely dilated.   6. A small pericardial effusion is present.   7. The mitral valve is normal in structure. Trivial mitral valve  regurgitation. No evidence of mitral stenosis.   8. Tricuspid valve regurgitation is moderate.   9. The aortic valve is calcified. Aortic valve regurgitation is trivial.  Severe aortic valve stenosis.  10. Aortic dilatation noted. There is moderate dilatation of the ascending  aorta, measuring 46 mm.  11. The inferior vena cava is dilated in size with <50% respiratory  variability, suggesting right atrial pressure of 15 mmHg.   Comparison(s): 04/13/20 EF 45-50%. PA pressure 72mmHg. Severe AS 19mmHg  mean PG, 4mmHg peak PG.   FINDINGS   Left Ventricle: Left ventricular ejection fraction, by estimation, is 45  to 50%. The left ventricle has mildly  decreased function. The left  ventricle demonstrates global hypokinesis. The average left ventricular  global longitudinal strain is -13.8 %.  The global longitudinal strain is abnormal. The left ventricular internal  cavity size was mildly dilated. There is mild left ventricular  hypertrophy. Left ventricular diastolic function could not be evaluated  due to atrial fibrillation. Left ventricular  diastolic function could not be evaluated.   Right Ventricle: The right ventricular size is moderately enlarged. Right  ventricular systolic function is normal. There is severely elevated  pulmonary artery systolic pressure. The tricuspid regurgitant velocity is  3.51 m/s, and with an assumed right  atrial pressure of 15 mmHg, the estimated right ventricular systolic  pressure is 64.3 mmHg.   Left Atrium: Left atrial size was severely dilated.   Right Atrium: Right atrial size was severely dilated.   Pericardium: A small pericardial effusion is present.   Mitral Valve: The mitral valve is normal in structure. Mild mitral annular  calcification. Trivial mitral valve regurgitation. No evidence of mitral  valve stenosis.   Tricuspid Valve: The tricuspid valve is normal in structure. Tricuspid  valve regurgitation is moderate . No evidence of tricuspid stenosis.   Aortic Valve: The aortic valve is calcified. Aortic valve regurgitation is  trivial. Severe aortic stenosis is present. Aortic valve mean gradient  measures 55.0 mmHg. Aortic valve peak gradient measures 91.6 mmHg. Aortic  valve area, by VTI measures 1.14  cm.   Pulmonic Valve: The pulmonic valve was normal in structure. Pulmonic valve  regurgitation is trivial. No evidence of pulmonic stenosis.   Aorta: Aortic dilatation noted. There is moderate dilatation of the  ascending aorta, measuring 46 mm.   Venous: The inferior vena cava is dilated in size with less than 50%  respiratory variability, suggesting right atrial  pressure of 15 mmHg.   IAS/Shunts: No atrial level shunt detected by color flow Doppler.   Additional Comments: Compared to previous study, AS is worse (mean  gradient now 55 mmHg).      LEFT VENTRICLE  PLAX 2D  LVIDd:         5.90 cm  LVIDs:         3.70 cm   2D Longitudinal Strain  LV PW:         1.30 cm   2D Strain GLS Avg:     -13.8 %  LV IVS:        0.90 cm  LVOT diam:     2.70 cm  LV SV:         133  LV SV Index:   60  LVOT Area:     5.73 cm      RIGHT VENTRICLE             IVC  RV Basal diam:  5.65 cm     IVC diam: 3.80 cm  RV Mid diam:    4.90 cm  RV S prime:     10.40 cm/s  TAPSE (M-mode): 1.8 cm  RVSP:           64.3 mmHg   LEFT ATRIUM              Index        RIGHT ATRIUM            Index  LA diam:        6.00 cm  2.70 cm/m   RA Pressure: 15.00 mmHg  LA Vol (A2C):   117.0 ml 52.73 ml/m  RA Area:     35.70 cm  LA Vol (A4C):   117.0 ml 52.73 ml/m  RA Volume:   136.00 ml  61.29 ml/m  LA Biplane Vol: 120.0 ml 54.08 ml/m   AORTIC VALVE  AV Area (Vmax):    1.25 cm  AV Area (Vmean):   1.18 cm  AV Area (VTI):     1.14 cm  AV Vmax:           478.50 cm/s  AV Vmean:          345.800 cm/s  AV VTI:            1.165 m  AV Peak Grad:  91.6 mmHg  AV Mean Grad:      55.0 mmHg  LVOT Vmax:         104.40 cm/s  LVOT Vmean:        71.133 cm/s  LVOT VTI:          0.232 m  LVOT/AV VTI ratio: 0.20     AORTA  Ao Root diam: 3.70 cm  Ao Asc diam:  4.40 cm   TRICUSPID VALVE  TR Peak grad:   49.3 mmHg  TR Vmax:        351.00 cm/s  Estimated RAP:  15.00 mmHg  RVSP:           64.3 mmHg     SHUNTS  Systemic VTI:  0.23 m  Systemic Diam: 2.70 cm   Kirk Ruths MD  Electronically signed by Kirk Ruths MD  Signature Date/Time: 06/02/2021/4:24:05 PM      Impression:  This 81 year old gentleman has stage D, severe, symptomatic aortic stenosis with New York Heart Association class II symptoms of exertional fatigue and shortness of breath as well as  significant lower extremity edema consistent with chronic diastolic congestive heart failure.  I have personally reviewed his 2D echocardiogram, cardiac catheterization, and CTA studies.  His aortic valve is severely calcified with restricted mobility with a mean gradient of 55 mmHg consistent with critical aortic stenosis.  Left ventricular ejection fraction is mildly reduced to 45 to 50% and was 55% in January 2021.  His left ventricular internal dimension in diastole is 5.9 cm.  I think that it is time to proceed with aortic valve replacement for relief of his symptoms and to prevent progressive left ventricular deterioration.  Given his age of almost 35 I think transcatheter aortic valve replacement would be the best option for him if his anatomy is suitable.  He has not had a cardiac catheterization or CTA studies yet. The patient and his significant other were counseled at length regarding treatment alternatives for management of severe symptomatic aortic stenosis. The risks and benefits of surgical intervention has been discussed in detail. Long-term prognosis with medical therapy was discussed. Alternative approaches such as conventional surgical aortic valve replacement, transcatheter aortic valve replacement, and palliative medical therapy were compared and contrasted at length. This discussion was placed in the context of the patient's own specific clinical presentation and past medical history. All of their questions have been addressed.   He said that he understands the need for aortic valve replacement to prevent progressively worsening heart failure and is inclined to proceed with further work-up but would like to discuss it first with his significant other before proceeding further.  Plan:  He will call us back to schedule cardiac catheterization with Dr. Irish Lack followed by TAVR CT work-up.  I will see him back after the completion of those studies to discuss them with him, answer any further  questions, and schedule TAVR if appropriate.   Gaye Pollack, MD 07/14/2021

## 2021-07-22 ENCOUNTER — Ambulatory Visit: Payer: Medicare Other | Admitting: Interventional Cardiology

## 2021-08-22 ENCOUNTER — Telehealth: Payer: Self-pay | Admitting: Interventional Cardiology

## 2021-08-22 DIAGNOSIS — I350 Nonrheumatic aortic (valve) stenosis: Secondary | ICD-10-CM

## 2021-08-22 NOTE — Telephone Encounter (Signed)
I don't think echo will show any significant change.  I don't think it is needed and insurance will not pay for it.  If he would like one and understands the above message, ok to order.  He may have to pay out of pocket.  ?

## 2021-08-22 NOTE — Telephone Encounter (Signed)
I spoke with patient.  He would like to have echo done prior to seeing Dr Irish Lack on 09/01/21.  I told patient message would be sent to Dr Irish Lack to see if echo indicated prior to office visit ?

## 2021-08-22 NOTE — Telephone Encounter (Signed)
Patient says that he would like to have an echo done right before he sees him. ?

## 2021-08-23 NOTE — Telephone Encounter (Signed)
Our billing department has spoken with patient and he would like to have echo done in [place] office.  Order placed.  ?

## 2021-08-23 NOTE — Telephone Encounter (Signed)
I spoke with patient and gave him information from Dr Irish Lack.  Patient verbalizes understanding.  He would like to know how much echo would be out of pocket before proceeding.  Will ask billing to contact patient prior to placing echo order ?

## 2021-08-30 ENCOUNTER — Ambulatory Visit (INDEPENDENT_AMBULATORY_CARE_PROVIDER_SITE_OTHER): Payer: Medicare Other

## 2021-08-30 ENCOUNTER — Other Ambulatory Visit: Payer: Self-pay

## 2021-08-30 DIAGNOSIS — I350 Nonrheumatic aortic (valve) stenosis: Secondary | ICD-10-CM

## 2021-08-30 LAB — ECHOCARDIOGRAM COMPLETE
AR max vel: 0.49 cm2
AV Area VTI: 0.41 cm2
AV Area mean vel: 0.43 cm2
AV Mean grad: 61.3 mmHg
AV Peak grad: 93.2 mmHg
Ao pk vel: 4.83 m/s
Area-P 1/2: 6.07 cm2
Calc EF: 48.3 %
S' Lateral: 4.5 cm
Single Plane A2C EF: 47.2 %
Single Plane A4C EF: 48.5 %

## 2021-08-30 MED ORDER — PERFLUTREN LIPID MICROSPHERE
1.0000 mL | INTRAVENOUS | Status: AC | PRN
  Administered 2021-08-30: 6 mL via INTRAVENOUS

## 2021-09-01 ENCOUNTER — Ambulatory Visit (INDEPENDENT_AMBULATORY_CARE_PROVIDER_SITE_OTHER): Payer: Medicare Other | Admitting: Interventional Cardiology

## 2021-09-01 ENCOUNTER — Other Ambulatory Visit: Payer: Self-pay

## 2021-09-01 ENCOUNTER — Encounter: Payer: Self-pay | Admitting: Interventional Cardiology

## 2021-09-01 VITALS — BP 122/68 | HR 89 | Ht 74.0 in | Wt 198.0 lb

## 2021-09-01 DIAGNOSIS — D6869 Other thrombophilia: Secondary | ICD-10-CM | POA: Diagnosis not present

## 2021-09-01 DIAGNOSIS — I350 Nonrheumatic aortic (valve) stenosis: Secondary | ICD-10-CM | POA: Diagnosis not present

## 2021-09-01 DIAGNOSIS — R600 Localized edema: Secondary | ICD-10-CM | POA: Diagnosis not present

## 2021-09-01 DIAGNOSIS — I4821 Permanent atrial fibrillation: Secondary | ICD-10-CM | POA: Diagnosis not present

## 2021-09-01 NOTE — Progress Notes (Signed)
?  ?Cardiology Office Note ? ? ?Date:  09/01/2021  ? ?ID:  Devin Buckley, DOB 05-Apr-1941, MRN [id number] ? ?PCP:  Devin Aus, Devin Buckley  ? ? ?No chief complaint on file. ? ?Aortic stenosis ? ?Wt Readings from Last 3 Encounters:  ?09/01/21 198 lb (89.8 kg)  ?07/14/21 204 lb (92.5 kg)  ?07/07/21 204 lb 9.6 oz (92.8 kg)  ?  ? ?  ?History of Present Illness: ?Devin Buckley is a 81 y.o. male who is being seen today for the evaluation of aortic stenosis.  ? ?Repeat echo in March 2023 showed no change from the prior echo.  He has mildly decreased LV function with severe aortic stenosis. ? ?Denies : Chest pain. Dizziness.  Nitroglycerin use. Orthopnea. Palpitations. Paroxysmal nocturnal dyspnea. Shortness of breath. Syncope.   ? ?He does have lower extremity swelling but this is improved with furosemide.  He notes a brisk response to the furosemide and will urinate quite a bit.  He is to stay on for a few hours after taking the medicine.  He is exercising.  He describes his wife as his exercise coach.  He lifts some light weights. ? ?No bleeding problems.  Now taking Eliquis for stroke prevention. ? ?Still wants to hold off on cardiac cath for further definitive treatment for his aortic valve stenosis.  He did speak with Dr. Cyndia Bent we did tell him that if he had no coronary artery disease, he would be a candidate for TAVR. ? ? ? ?Past Medical History:  ?Diagnosis Date  ? Afib ([place])   ? Anxiety   ? 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 Cataract   ? ED (erectile dysfunction)   ? Edema   ? Gout   ? History of kidney stones   ? Hormone disorder   ? Hyperlipidemia   ? Hyperlipidemia   ? Lumbar disc disease   ? Nephrolithiasis   ? Patellofemoral arthritis   ? ? ?Past Surgical History:  ?Procedure Laterality Date  ? ACHILLES TENDON REPAIR Left   ? arthroscopic shoulder  Right   ? BROW LIFT Bilateral 04/08/2018  ? Procedure: BLEPHAROPLASTY BILATERAL LOWER LID;  Surgeon: Bea Graff, Devin Buckley;  Location: [facility];  Service: Plastics;  Laterality:  Bilateral;  ? INSERTION OF MESH  04/27/2021  ? Procedure: INSERTION OF MESH;  Surgeon: Herbert Pun, Devin Buckley;  Location: [facility];  Service: General;;  ? RHYTIDECTOMY N/A 04/08/2018  ? Procedure: RHYTIDECTOMY;  Surgeon: Bea Graff, Devin Buckley;  Location: [facility];  Service: Plastics;  Laterality: N/A;  ? ? ? ?Current Outpatient Medications  ?Medication Sig Dispense Refill  ? apixaban (ELIQUIS) 5 MG TABS tablet Take 1 tablet (5 mg total) by mouth 2 (two) times daily. 60 tablet 11  ? buprenorphine-naloxone (SUBOXONE) 8-2 mg SUBL SL tablet Place 2.5 tablets under the tongue as needed.    ? clonazePAM (KLONOPIN) 0.5 MG tablet Take 0.5 mg by mouth 2 (two) times daily as needed for anxiety.    ? COLLAGEN PO Take 2 Scoops by mouth 3 (three) times daily.    ? Digestive Enzyme CAPS Take 3 capsules by mouth 3 (three) times daily.    ? furosemide (LASIX) 40 MG tablet Take 1.5 tablets (60 mg total) by mouth daily. 135 tablet 3  ? hydrocortisone 2.5 % cream Apply 1 application topically daily as needed for rash.    ? Multiple Vitamin (MULTIVITAMIN WITH MINERALS) TABS tablet Take 3 tablets by mouth daily. Total minerals    ? OVER  THE COUNTER MEDICATION Take 3 capsules by mouth daily. Phase 2 otc supplement. Per patient this is a mineral    ? Probiotic Product (PROBIOTIC PO) Take 3 capsules by mouth 3 (three) times daily. total flora    ? VITAMIN D-VITAMIN K PO Take 1 capsule by mouth daily.    ? ?No current facility-administered medications for this visit.  ? ? ?Allergies:   Patient has no known allergies.  ? ? ?Social History:  The patient  reports that he has never smoked. He has never used smokeless tobacco. He reports that he does not drink alcohol and does not use drugs.  ? ?Family History:  The patient's family history includes Alzheimer's disease in his father; CVA in his father; Colon cancer in his mother; Diabetes in his brother.  ? ? ?ROS:  Please see the history of present illness.   Otherwise, review of systems are positive  for leg swelling.   All other systems are reviewed and negative.  ? ? ?PHYSICAL EXAM: ?VS:  BP 122/68   Pulse 89   Ht '6\' 2"'$  (1.88 m)   Wt 198 lb (89.8 kg)   SpO2 96%   BMI 25.42 kg/m?  , BMI Body mass index is 25.42 kg/m?. ?GEN: Well nourished, well developed, in no acute distress ?HEENT: normal ?Neck: no JVD, carotid bruits, or masses ?Cardiac: Irregularly irregular; no murmurs, rubs, or gallops,; bilateral lower extremity pitting edema  ?Respiratory:  clear to auscultation bilaterally, normal work of breathing ?GI: soft, nontender, nondistended, + BS ?MS: no deformity or atrophy ?Skin: warm and dry, no rash ?Neuro:  Strength and sensation are intact ?Psych: euthymic mood, full affect ? ? ?EKG:   ?The ekg ordered today demonstrates atrial fibrillation, PVC, rate controlled ? ? ?Recent Labs: ?06/23/2021: Hemoglobin 15.2; Platelets 107 ?07/07/2021: ALT 25; BUN 29; Creatinine, Ser 1.00; Potassium 4.3; Sodium 140  ? ?Lipid Panel ?No results found for: CHOL, TRIG, HDL, CHOLHDL, VLDL, LDLCALC, LDLDIRECT ?  ?Other studies Reviewed: ?Additional studies/ records that were reviewed today with results demonstrating: Labs reviewed from January 2023, creatinine and potassium are both normal. ? ? ?ASSESSMENT AND PLAN: ? ?Severe aortic stenosis: We again discussed the fact that valve replacement would be the only definitive treatment for this.  He is feeling well enough that he would like to hold off on cardiac cath.  Although we have recommended proceeding with aortic valve surgery, he has been hesitant.  We will have to follow his LV function as well to see if there is any change or further decrease in LVEF.  I am hopeful that his LVEF would improve at this stage if he were to have AVR. ?Chronic systolic heart failure: Trying to manage volume with Lasix. ?Anticoagulated: He is now taking Eliquis for stroke prevention in the setting of atrial fibrillation/acquired thrombophilia.  No bleeding problems.  Tolerating this medicine  well. ? ? ?Current medicines are reviewed at length with the patient today.  The patient concerns regarding his medicines were addressed. ? ?The following changes have been made:  No change ? ?Labs/ tests ordered today include:  ?No orders of the defined types were placed in this encounter. ? ? ?Recommend 150 minutes/week of aerobic exercise ?Low fat, low carb, high fiber diet recommended ? ?Disposition:   FU in 6 months ? ? ?Signed, ?Larae Grooms, Devin Buckley  ?09/01/2021 4:25 PM    ?[facility] ?[place], [place], [place]  [address] ?Phone: [phone]; Fax: [phone]  ? ?

## 2021-09-01 NOTE — Patient Instructions (Signed)
Medication Instructions:  ?Your physician recommends that you continue on your current medications as directed. Please refer to the Current Medication list given to you today. ? ?*If you need a refill on your cardiac medications before your next appointment, please call your pharmacy* ? ? ?Lab Work: ?Your physician recommends that you return for lab work  on December 27, 2021.  CBC and BMP.  This is not fasting.  The lab is open from 7:30 AM - 4:15 PM ? ?If you have labs (blood work) drawn today and your tests are completely normal, you will receive your results only by: ?MyChart Message (if you have MyChart) OR ?A paper copy in the mail ?If you have any lab test that is abnormal or we need to change your treatment, we will call you to review the results. ? ? ?Testing/Procedures: ?none ? ? ?Follow-Up: ?At [facility], you and your health needs are our priority.  As part of our continuing mission to provide you with exceptional heart care, we have created designated Provider Care Teams.  These Care Teams include your primary Cardiologist (physician) and Advanced Practice Providers (APPs -  Physician Assistants and Nurse Practitioners) who all work together to provide you with the care you need, when you need it. ? ?We recommend signing up for the patient portal called "MyChart".  Sign up information is provided on this After Visit Summary.  MyChart is used to connect with patients for Virtual Visits (Telemedicine).  Patients are able to view lab/test results, encounter notes, upcoming appointments, etc.  Non-urgent messages can be sent to your provider as well.   ?To learn more about what you can do with MyChart, go to [email or web address].   ? ?Your next appointment:   ?March 08, 2022 at 10:00 ? ?The format for your next appointment:   ?In Person ? ?Provider:   ?Larae Grooms, MD   ? ? ?Other Instructions ?  ? ?

## 2021-09-02 NOTE — Addendum Note (Signed)
Addended by: Ulice Brilliant T on: 09/02/2021 08:02 AM ? ? Modules accepted: Orders ? ?

## 2021-11-28 ENCOUNTER — Telehealth: Payer: Self-pay | Admitting: Interventional Cardiology

## 2021-11-28 DIAGNOSIS — I350 Nonrheumatic aortic (valve) stenosis: Secondary | ICD-10-CM

## 2021-11-28 NOTE — Telephone Encounter (Signed)
Pt states that he would like for Dr. Irish Lack to put a order in for him to have an Echo w/ Contrast. Please advise

## 2021-11-28 NOTE — Telephone Encounter (Signed)
Returned call to patient. He is requesting to have another echo done. He states that he is willing to pay out of pocket and prefers it to be done in [place].   Patient has a Hx of AS. Denies worsening Sx. States that his dizziness and swelling have improved. He states that he is not ready to proceed with cath yet but would like an updated echo done. Made patient aware that I will forward to Dr. Irish Lack for approval.

## 2021-11-29 NOTE — Telephone Encounter (Signed)
Left message for patient to call back  

## 2021-11-30 NOTE — Telephone Encounter (Signed)
Devin Booze, MD  Drue Novel I, RN 2 days ago    OK with me.  I think he has seen the surgeons already too.     Spoke with patient who still wants the ECHO and is willing to pay out of pocket for it. He verified that he wants it done in [place]. Order placed at this time.

## 2021-11-30 NOTE — Telephone Encounter (Signed)
Pt returning nurse's call. Please advise

## 2021-12-06 ENCOUNTER — Ambulatory Visit (INDEPENDENT_AMBULATORY_CARE_PROVIDER_SITE_OTHER): Payer: Medicare Other

## 2021-12-06 DIAGNOSIS — I350 Nonrheumatic aortic (valve) stenosis: Secondary | ICD-10-CM

## 2021-12-06 LAB — ECHOCARDIOGRAM COMPLETE
AR max vel: 0.61 cm2
AV Area VTI: 0.53 cm2
AV Area mean vel: 0.51 cm2
AV Mean grad: 63 mmHg
AV Peak grad: 93.7 mmHg
Ao pk vel: 4.84 m/s
Area-P 1/2: 6.12 cm2
Calc EF: 48.8 %
S' Lateral: 4.3 cm
Single Plane A2C EF: 54.1 %
Single Plane A4C EF: 39.7 %

## 2021-12-06 MED ORDER — PERFLUTREN LIPID MICROSPHERE
1.0000 mL | INTRAVENOUS | Status: AC | PRN
  Administered 2021-12-06: 5 mL via INTRAVENOUS

## 2021-12-07 ENCOUNTER — Telehealth: Payer: Self-pay | Admitting: *Deleted

## 2021-12-07 NOTE — Telephone Encounter (Signed)
-----   Message from Jettie Booze, MD sent at 12/07/2021  8:45 AM EDT ----- EF around the lower limits of normal.  Aortic valve velocity now up to 5 m/s indicating critical aortic stenosis, mildly increased from prior.  Small pericardial effusion.

## 2021-12-07 NOTE — Telephone Encounter (Signed)
Left message to call office

## 2021-12-22 ENCOUNTER — Other Ambulatory Visit: Payer: Medicare Other

## 2021-12-23 ENCOUNTER — Other Ambulatory Visit: Payer: Medicare Other | Admitting: *Deleted

## 2021-12-23 DIAGNOSIS — I350 Nonrheumatic aortic (valve) stenosis: Secondary | ICD-10-CM

## 2021-12-24 LAB — BASIC METABOLIC PANEL
BUN/Creatinine Ratio: 23 (ref 10–24)
BUN: 27 mg/dL (ref 8–27)
CO2: 30 mmol/L — ABNORMAL HIGH (ref 20–29)
Calcium: 8.6 mg/dL (ref 8.6–10.2)
Chloride: 101 mmol/L (ref 96–106)
Creatinine, Ser: 1.17 mg/dL (ref 0.76–1.27)
Glucose: 118 mg/dL — ABNORMAL HIGH (ref 70–99)
Potassium: 4.4 mmol/L (ref 3.5–5.2)
Sodium: 142 mmol/L (ref 134–144)
eGFR: 63 mL/min/{1.73_m2} (ref >59)

## 2021-12-24 LAB — CBC
Hematocrit: 43.3 % (ref 37.5–51.0)
Hemoglobin: 14.1 g/dL (ref 13.0–17.7)
MCH: 32 pg (ref 26.6–33.0)
MCHC: 32.6 g/dL (ref 31.5–35.7)
MCV: 98 fL — ABNORMAL HIGH (ref 79–97)
Platelets: 134 10*3/uL — ABNORMAL LOW (ref 150–450)
RBC: 4.41 x10E6/uL (ref 4.14–5.80)
RDW: 12.5 % (ref 11.6–15.4)
WBC: 4.8 10*3/uL (ref 3.4–10.8)

## 2021-12-27 ENCOUNTER — Other Ambulatory Visit: Payer: Medicare Other

## 2022-01-29 NOTE — Progress Notes (Signed)
Cardiology Office Note   Date:  01/30/2022   ID:  Devin Buckley, DOB 03/28/1941, MRN [id number]  PCP:  Rusty Aus, MD    No chief complaint on file.  Severe aortic stenosis  Wt Readings from Last 3 Encounters:  01/30/22 191 lb (86.6 kg)  09/01/21 198 lb (89.8 kg)  07/14/21 204 lb (92.5 kg)       History of Present Illness: Devin Buckley is a 81 y.o. male  with symptomatic aortic stenosis.    Several prior echos have ben done confirming severe AS.   He has mildly decreased LV function with severe aortic stenosis.   He does have lower extremity swelling but this is improved with furosemide. He did speak with Dr. Cyndia Bent we did tell him that if he had no coronary artery disease, he would be a candidate for TAVR.  He is agreeable to AVR and needs a cath scheduled.   Denies : Chest pain. Dizziness.  Nitroglycerin use. Orthopnea. Palpitations. Paroxysmal nocturnal dyspnea.  Syncope.    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[place])    Cataract    ED (erectile dysfunction)    Edema    Gout    History of kidney stones    Hormone disorder    Hyperlipidemia    Hyperlipidemia    Lumbar disc disease    Nephrolithiasis    Patellofemoral arthritis     Past Surgical History:  Procedure Laterality Date   ACHILLES TENDON REPAIR Left    arthroscopic shoulder  Right    BROW LIFT Bilateral 04/08/2018   Procedure: BLEPHAROPLASTY BILATERAL LOWER LID;  Surgeon: Bea Graff, MD;  Location: [facility];  Service: Plastics;  Laterality: Bilateral;   INSERTION OF MESH  04/27/2021   Procedure: INSERTION OF MESH;  Surgeon: Herbert Pun, MD;  Location: [facility];  Service: General;;   RHYTIDECTOMY N/A 04/08/2018   Procedure: Malon Kindle;  Surgeon: Bea Graff, MD;  Location: [facility];  Service: Plastics;  Laterality: N/A;     Current Outpatient Medications  Medication Sig Dispense Refill   apixaban (ELIQUIS) 5 MG TABS tablet Take 1  tablet (5 mg total) by mouth 2 (two) times daily. 60 tablet 11   buprenorphine-naloxone (SUBOXONE) 8-2 mg SUBL SL tablet Place 2.5 tablets under the tongue as needed.     clonazePAM (KLONOPIN) 0.5 MG tablet Take 0.5 mg by mouth 2 (two) times daily as needed for anxiety.     COLLAGEN PO Take 2 Scoops by mouth 3 (three) times daily.     Digestive Enzyme CAPS Take 3 capsules by mouth 3 (three) times daily.     hydrocortisone 2.5 % cream Apply 1 application topically daily as needed for rash.     Multiple Vitamin (MULTIVITAMIN WITH MINERALS) TABS tablet Take 3 tablets by mouth daily. Total minerals     OVER THE COUNTER MEDICATION Take 3 capsules by mouth daily. Phase 2 otc supplement. Per patient this is a mineral     Probiotic Product (PROBIOTIC PO) Take 3 capsules by mouth 3 (three) times daily. total flora     VITAMIN D-VITAMIN K PO Take 1 capsule by mouth daily.     furosemide (LASIX) 40 MG tablet Take 1.5 tablets (60 mg total) by mouth daily. 135 tablet 3   No current facility-administered medications for this visit.    Allergies:   Patient has no known allergies.  Social History:  The patient  reports that he has never smoked. He has never used smokeless tobacco. He reports that he does not drink alcohol and does not use drugs.   Family History:  The patient's family history includes Alzheimer's disease in his father; CVA in his father; Colon cancer in his mother; Diabetes in his brother.    ROS:  Please see the history of present illness.   Otherwise, review of systems are positive for LE edema.   All other systems are reviewed and negative.    PHYSICAL EXAM: VS:  BP 130/66   Pulse 93   Ht '6\' 2"'$  (1.88 m)   Wt 191 lb (86.6 kg)   SpO2 95%   BMI 24.52 kg/m  , BMI Body mass index is 24.52 kg/m. GEN: Well nourished, well developed, in no acute distress HEENT: normal Neck: no JVD, carotid bruits, or masses Cardiac: irregularly irregular; 3/6 systolic murmurs, rubs, or gallops,;  1+ bilateral leg edema  Respiratory:  clear to auscultation bilaterally, normal work of breathing GI: soft, nontender, nondistended, + BS MS: no deformity or atrophy Skin: warm and dry, no rash Neuro:  Strength and sensation are intact Psych: euthymic mood, full affect   EKG:   The ekg ordered today demonstrates atrial fibrillation with controlled ventricular response, PVCs   Recent Labs: 07/07/2021: ALT 25 12/23/2021: BUN 27; Creatinine, Ser 1.17; Hemoglobin 14.1; Platelets 134; Potassium 4.4; Sodium 142   Lipid Panel No results found for: "CHOL", "TRIG", "HDL", "CHOLHDL", "VLDL", "LDLCALC", "LDLDIRECT"   Other studies Reviewed: Additional studies/ records that were reviewed today with results demonstrating: Labs reviewed.   ASSESSMENT AND PLAN:  Severe aortic stenosis: He has had mild LV dysfunction likely related to the aortic stenosis for several years.  He has had lower extremity edema as well.  He is now willing to have cardiac cath followed by aortic valve replacement for symptomatic aortic stenosis.  He has already seen Dr. Cyndia Bent several months ago.  Cardiac cath is scheduled for later this week.  All questions answered. AFib: Rate can trolled.  Eliquis for stroke prevention.  He admits that he has not been taking the Eliquis regularly.  I did ask him to hold the Eliquis for 2 days prior to the cardiac cath but other than that, he should take it for stroke prevention. Chronic systolic heart failure: Mild volume overload noted by his lower extremity edema.  Continue to try to manage volume status with furosemide.  He has not been started on aggressive heart failure management due to his general tendency to stay to more natural medicines. Anticoagulated: LE edema: Elevate legs.  Continue Lasix. Hyperlipidemia: LDL 180 in June 2023.  Await catheter results.  He may be more willing to take a statin depending on the presence of coronary artery disease.  In the past, he has tried to  stick to natural remedies.   Current medicines are reviewed at length with the patient today.  The patient concerns regarding his medicines were addressed.  The following changes have been made:  No change  Labs/ tests ordered today include:  No orders of the defined types were placed in this encounter.   Recommend 150 minutes/week of aerobic exercise Low fat, low carb, high fiber diet recommended  Disposition:   FU post valve procedure   Signed, Larae Grooms, MD  01/30/2022 3:12 PM    [facility] [place], [place], [place]  [address] Phone: [phone]; Fax: [phone]

## 2022-01-29 NOTE — H&P (View-Only) (Signed)
Cardiology Office Note   Date:  01/30/2022   ID:  Devin Buckley, DOB Sep 11, 1940, MRN [id number]  PCP:  Rusty Aus, MD    No chief complaint on file.  Severe aortic stenosis  Wt Readings from Last 3 Encounters:  01/30/22 191 lb (86.6 kg)  09/01/21 198 lb (89.8 kg)  07/14/21 204 lb (92.5 kg)       History of Present Illness: Devin Buckley is a 81 y.o. male  with symptomatic aortic stenosis.    Several prior echos have ben done confirming severe AS.   He has mildly decreased LV function with severe aortic stenosis.   He does have lower extremity swelling but this is improved with furosemide. He did speak with Dr. Cyndia Bent we did tell him that if he had no coronary artery disease, he would be a candidate for TAVR.  He is agreeable to AVR and needs a cath scheduled.   Denies : Chest pain. Dizziness.  Nitroglycerin use. Orthopnea. Palpitations. Paroxysmal nocturnal dyspnea.  Syncope.    Past Medical History:  Diagnosis Date   Afib ([place])    Anxiety    Aortic atherosclerosis ([place])    Cataract    ED (erectile dysfunction)    Edema    Gout    History of kidney stones    Hormone disorder    Hyperlipidemia    Hyperlipidemia    Lumbar disc disease    Nephrolithiasis    Patellofemoral arthritis     Past Surgical History:  Procedure Laterality Date   ACHILLES TENDON REPAIR Left    arthroscopic shoulder  Right    BROW LIFT Bilateral 04/08/2018   Procedure: BLEPHAROPLASTY BILATERAL LOWER LID;  Surgeon: Bea Graff, MD;  Location: [facility];  Service: Plastics;  Laterality: Bilateral;   INSERTION OF MESH  04/27/2021   Procedure: INSERTION OF MESH;  Surgeon: Herbert Pun, MD;  Location: [facility];  Service: General;;   RHYTIDECTOMY N/A 04/08/2018   Procedure: Malon Kindle;  Surgeon: Bea Graff, MD;  Location: [facility];  Service: Plastics;  Laterality: N/A;     Current Outpatient Medications  Medication Sig Dispense Refill   apixaban (ELIQUIS) 5 MG TABS tablet Take 1  tablet (5 mg total) by mouth 2 (two) times daily. 60 tablet 11   buprenorphine-naloxone (SUBOXONE) 8-2 mg SUBL SL tablet Place 2.5 tablets under the tongue as needed.     clonazePAM (KLONOPIN) 0.5 MG tablet Take 0.5 mg by mouth 2 (two) times daily as needed for anxiety.     COLLAGEN PO Take 2 Scoops by mouth 3 (three) times daily.     Digestive Enzyme CAPS Take 3 capsules by mouth 3 (three) times daily.     hydrocortisone 2.5 % cream Apply 1 application topically daily as needed for rash.     Multiple Vitamin (MULTIVITAMIN WITH MINERALS) TABS tablet Take 3 tablets by mouth daily. Total minerals     OVER THE COUNTER MEDICATION Take 3 capsules by mouth daily. Phase 2 otc supplement. Per patient this is a mineral     Probiotic Product (PROBIOTIC PO) Take 3 capsules by mouth 3 (three) times daily. total flora     VITAMIN D-VITAMIN K PO Take 1 capsule by mouth daily.     furosemide (LASIX) 40 MG tablet Take 1.5 tablets (60 mg total) by mouth daily. 135 tablet 3   No current facility-administered medications for this visit.    Allergies:   Patient has no known allergies.  Social History:  The patient  reports that he has never smoked. He has never used smokeless tobacco. He reports that he does not drink alcohol and does not use drugs.   Family History:  The patient's family history includes Alzheimer's disease in his father; CVA in his father; Colon cancer in his mother; Diabetes in his brother.    ROS:  Please see the history of present illness.   Otherwise, review of systems are positive for LE edema.   All other systems are reviewed and negative.    PHYSICAL EXAM: VS:  BP 130/66   Pulse 93   Ht '6\' 2"'$  (1.88 m)   Wt 191 lb (86.6 kg)   SpO2 95%   BMI 24.52 kg/m  , BMI Body mass index is 24.52 kg/m. GEN: Well nourished, well developed, in no acute distress HEENT: normal Neck: no JVD, carotid bruits, or masses Cardiac: irregularly irregular; 3/6 systolic murmurs, rubs, or gallops,;  1+ bilateral leg edema  Respiratory:  clear to auscultation bilaterally, normal work of breathing GI: soft, nontender, nondistended, + BS MS: no deformity or atrophy Skin: warm and dry, no rash Neuro:  Strength and sensation are intact Psych: euthymic mood, full affect   EKG:   The ekg ordered today demonstrates atrial fibrillation with controlled ventricular response, PVCs   Recent Labs: 07/07/2021: ALT 25 12/23/2021: BUN 27; Creatinine, Ser 1.17; Hemoglobin 14.1; Platelets 134; Potassium 4.4; Sodium 142   Lipid Panel No results found for: "CHOL", "TRIG", "HDL", "CHOLHDL", "VLDL", "LDLCALC", "LDLDIRECT"   Other studies Reviewed: Additional studies/ records that were reviewed today with results demonstrating: Labs reviewed.   ASSESSMENT AND PLAN:  Severe aortic stenosis: He has had mild LV dysfunction likely related to the aortic stenosis for several years.  He has had lower extremity edema as well.  He is now willing to have cardiac cath followed by aortic valve replacement for symptomatic aortic stenosis.  He has already seen Dr. Cyndia Bent several months ago.  Cardiac cath is scheduled for later this week.  All questions answered. AFib: Rate can trolled.  Eliquis for stroke prevention.  He admits that he has not been taking the Eliquis regularly.  I did ask him to hold the Eliquis for 2 days prior to the cardiac cath but other than that, he should take it for stroke prevention. Chronic systolic heart failure: Mild volume overload noted by his lower extremity edema.  Continue to try to manage volume status with furosemide.  He has not been started on aggressive heart failure management due to his general tendency to stay to more natural medicines. Anticoagulated: LE edema: Elevate legs.  Continue Lasix. Hyperlipidemia: LDL 180 in June 2023.  Await catheter results.  He may be more willing to take a statin depending on the presence of coronary artery disease.  In the past, he has tried to  stick to natural remedies.   Current medicines are reviewed at length with the patient today.  The patient concerns regarding his medicines were addressed.  The following changes have been made:  No change  Labs/ tests ordered today include:  No orders of the defined types were placed in this encounter.   Recommend 150 minutes/week of aerobic exercise Low fat, low carb, high fiber diet recommended  Disposition:   FU post valve procedure   Signed, Larae Grooms, MD  01/30/2022 3:12 PM    [facility] [place], [place], [place]  [address] Phone: [phone]; Fax: [phone]

## 2022-01-30 ENCOUNTER — Encounter: Payer: Self-pay | Admitting: Interventional Cardiology

## 2022-01-30 ENCOUNTER — Ambulatory Visit (INDEPENDENT_AMBULATORY_CARE_PROVIDER_SITE_OTHER): Payer: Medicare Other | Admitting: Interventional Cardiology

## 2022-01-30 VITALS — BP 130/66 | HR 93 | Ht 74.0 in | Wt 191.0 lb

## 2022-01-30 DIAGNOSIS — E782 Mixed hyperlipidemia: Secondary | ICD-10-CM | POA: Diagnosis not present

## 2022-01-30 DIAGNOSIS — I5042 Chronic combined systolic (congestive) and diastolic (congestive) heart failure: Secondary | ICD-10-CM

## 2022-01-30 DIAGNOSIS — R600 Localized edema: Secondary | ICD-10-CM | POA: Diagnosis not present

## 2022-01-30 DIAGNOSIS — I4821 Permanent atrial fibrillation: Secondary | ICD-10-CM

## 2022-01-30 DIAGNOSIS — I350 Nonrheumatic aortic (valve) stenosis: Secondary | ICD-10-CM

## 2022-01-30 NOTE — Patient Instructions (Signed)
Medication Instructions:  Your physician recommends that you continue on your current medications as directed. Please refer to the Current Medication list given to you today.  *If you need a refill on your cardiac medications before your next appointment, please call your pharmacy*   Lab Work: TODAY: BMET, CBC If you have labs (blood work) drawn today and your tests are completely normal, you will receive your results only by: [place] (if you have MyChart) OR A paper copy in the mail If you have any lab test that is abnormal or we need to change your treatment, we will call you to review the results.   Testing/Procedures:  [facility] OFFICE [place], [address] [place] [address] Dept: [phone] Loc: [place]  01/30/2022  You are scheduled for a Cardiac Catheterization on Friday, August 18 with Dr. Larae Grooms.  1. Please arrive at the Main Entrance A at [facility]: [place], [place] [address] at 7:00 AM (This time is two hours before your procedure to ensure your preparation). Free valet parking service is available.   Special note: Every effort is made to have your procedure done on time. Please understand that emergencies sometimes delay scheduled procedures.  2. Diet: Do not eat solid foods after midnight.  You may have clear liquids until 5 AM upon the day of the procedure.  3. Labs: TODAY  4. Medication instructions in preparation for your procedure:   Contrast Allergy: No  Stop taking Eliquis (Apixiban) on Wednesday, August 16.  Stop taking, Lasix (Furosemide)  Friday, August 18,   On the morning of your procedure, take Aspirin and any morning medicines NOT listed above.  You may use sips of water.  5. Plan to go home the same day, you will only stay overnight if medically necessary. 6. You MUST have a responsible adult to  drive you home. 7. An adult MUST be with you the first 24 hours after you arrive home. 8. Bring a current list of your medications, and the last time and date medication taken. 9. Bring ID and current insurance cards. 10.Please wear clothes that are easy to get on and off and wear slip-on shoes.  Thank you for allowing [place] to care for you!   -- [facility]    Follow-Up: At [facility], you and your health needs are our priority.  As part of our continuing mission to provide you with exceptional heart care, we have created designated Provider Care Teams.  These Care Teams include your primary Cardiologist (physician) and Advanced Practice Providers (APPs -  Physician Assistants and Nurse Practitioners) who all work together to provide you with the care you need, when you need it.  We recommend signing up for the patient portal called "MyChart".  Sign up information is provided on this After Visit Summary.  MyChart is used to connect with patients for Virtual Visits (Telemedicine).  Patients are able to view lab/test results, encounter notes, upcoming appointments, etc.  Non-urgent messages can be sent to your provider as well.   To learn more about what you can do with MyChart, go to [email or web address].    Your next appointment:   KEEP SCHEDULED FOLLOW-UP  Important Information About Sugar

## 2022-01-31 LAB — BASIC METABOLIC PANEL
BUN/Creatinine Ratio: 25 — ABNORMAL HIGH (ref 10–24)
BUN: 26 mg/dL (ref 8–27)
CO2: 31 mmol/L — ABNORMAL HIGH (ref 20–29)
Calcium: 8.4 mg/dL — ABNORMAL LOW (ref 8.6–10.2)
Chloride: 98 mmol/L (ref 96–106)
Creatinine, Ser: 1.02 mg/dL (ref 0.76–1.27)
Glucose: 95 mg/dL (ref 70–99)
Potassium: 3.6 mmol/L (ref 3.5–5.2)
Sodium: 141 mmol/L (ref 134–144)
eGFR: 74 mL/min/{1.73_m2} (ref >59)

## 2022-01-31 LAB — CBC
Hematocrit: 43.7 % (ref 37.5–51.0)
Hemoglobin: 14.6 g/dL (ref 13.0–17.7)
MCH: 31.7 pg (ref 26.6–33.0)
MCHC: 33.4 g/dL (ref 31.5–35.7)
MCV: 95 fL (ref 79–97)
Platelets: 134 10*3/uL — ABNORMAL LOW (ref 150–450)
RBC: 4.6 x10E6/uL (ref 4.14–5.80)
RDW: 12.9 % (ref 11.6–15.4)
WBC: 5.3 10*3/uL (ref 3.4–10.8)

## 2022-02-02 ENCOUNTER — Telehealth: Payer: Self-pay | Admitting: *Deleted

## 2022-02-02 NOTE — Telephone Encounter (Signed)
Cardiac Catheterization scheduled at [facility] for: Friday February 03, 2022 9 AM Arrival time and place: [facility] Main Entrance A at: 7 AM   Nothing to eat after midnight prior to procedure, clear liquids until 5 AM day of procedure.  Medication instructions: -Hold:  Eliquis-pt reports no Eliquis in about 2 weeks  Lasix-AM of procedure -Except hold medications usual morning medications can be taken with sips of water including aspirin 81 mg.  Confirmed patient has responsible adult to drive home post procedure and be with patient first 24 hours after arriving home.  Patient reports no new symptoms concerning for COVID-19 in the past 10 days.  Reviewed procedure instructions with patient.

## 2022-02-03 ENCOUNTER — Encounter (HOSPITAL_COMMUNITY): Payer: Self-pay | Admitting: Interventional Cardiology

## 2022-02-03 ENCOUNTER — Ambulatory Visit (HOSPITAL_COMMUNITY)
Admission: RE | Admit: 2022-02-03 | Discharge: 2022-02-03 | Disposition: A | Discharge status: Home / Self Care | Payer: Medicare Other | Source: Ambulatory Visit | Attending: Interventional Cardiology | Admitting: Interventional Cardiology

## 2022-02-03 ENCOUNTER — Other Ambulatory Visit: Payer: Self-pay

## 2022-02-03 ENCOUNTER — Ambulatory Visit (HOSPITAL_COMMUNITY)
Admission: RE | Disposition: A | Discharge status: Home / Self Care | Payer: Self-pay | Source: Ambulatory Visit | Attending: Interventional Cardiology

## 2022-02-03 ENCOUNTER — Other Ambulatory Visit: Payer: Self-pay | Admitting: Cardiology

## 2022-02-03 ENCOUNTER — Encounter: Payer: Self-pay | Admitting: Cardiology

## 2022-02-03 DIAGNOSIS — I350 Nonrheumatic aortic (valve) stenosis: Secondary | ICD-10-CM

## 2022-02-03 DIAGNOSIS — I2721 Secondary pulmonary arterial hypertension: Secondary | ICD-10-CM | POA: Diagnosis not present

## 2022-02-03 DIAGNOSIS — I5022 Chronic systolic (congestive) heart failure: Secondary | ICD-10-CM | POA: Insufficient documentation

## 2022-02-03 DIAGNOSIS — I4891 Unspecified atrial fibrillation: Secondary | ICD-10-CM | POA: Insufficient documentation

## 2022-02-03 DIAGNOSIS — Z7901 Long term (current) use of anticoagulants: Secondary | ICD-10-CM | POA: Diagnosis not present

## 2022-02-03 DIAGNOSIS — Z79899 Other long term (current) drug therapy: Secondary | ICD-10-CM | POA: Insufficient documentation

## 2022-02-03 DIAGNOSIS — I2510 Atherosclerotic heart disease of native coronary artery without angina pectoris: Secondary | ICD-10-CM | POA: Insufficient documentation

## 2022-02-03 DIAGNOSIS — E785 Hyperlipidemia, unspecified: Secondary | ICD-10-CM | POA: Diagnosis not present

## 2022-02-03 HISTORY — PX: RIGHT/LEFT HEART CATH AND CORONARY ANGIOGRAPHY: CATH118266

## 2022-02-03 LAB — POCT I-STAT EG7
Acid-Base Excess: 9 mmol/L — ABNORMAL HIGH (ref 0.0–2.0)
Bicarbonate: 35 mmol/L — ABNORMAL HIGH (ref 20.0–28.0)
Calcium, Ion: 1.2 mmol/L (ref 1.15–1.40)
HCT: 39 % (ref 39.0–52.0)
Hemoglobin: 13.3 g/dL (ref 13.0–17.0)
O2 Saturation: 73 %
Potassium: 3.4 mmol/L — ABNORMAL LOW (ref 3.5–5.1)
Sodium: 141 mmol/L (ref 135–145)
TCO2: 37 mmol/L — ABNORMAL HIGH (ref 22–32)
pCO2, Ven: 52.8 mmHg (ref 44–60)
pH, Ven: 7.429 (ref 7.25–7.43)
pO2, Ven: 39 mmHg (ref 32–45)

## 2022-02-03 SURGERY — RIGHT/LEFT HEART CATH AND CORONARY ANGIOGRAPHY
Anesthesia: LOCAL

## 2022-02-03 MED ORDER — SODIUM CHLORIDE 0.9% FLUSH: 3.0000 mL | Freq: Two times a day (BID) | INTRAVENOUS | Status: DC

## 2022-02-03 MED ORDER — MIDAZOLAM HCL 2 MG/2ML IJ SOLN
INTRAMUSCULAR | Status: AC
  Filled 2022-02-03: qty 2

## 2022-02-03 MED ORDER — IOHEXOL 350 MG/ML SOLN
INTRAVENOUS | Status: DC | PRN
  Administered 2022-02-03: 55 mL

## 2022-02-03 MED ORDER — SODIUM CHLORIDE 0.9 % WEIGHT BASED INFUSION: 1.0000 mL/kg/h | INTRAVENOUS | Status: DC

## 2022-02-03 MED ORDER — HEPARIN SODIUM (PORCINE) 1000 UNIT/ML IJ SOLN
INTRAMUSCULAR | Status: AC
  Filled 2022-02-03: qty 10

## 2022-02-03 MED ORDER — SODIUM CHLORIDE 0.9% FLUSH
3.0000 mL | INTRAVENOUS | Status: DC | PRN
Start: 2022-02-03 — End: 2022-02-03

## 2022-02-03 MED ORDER — ACETAMINOPHEN 325 MG PO TABS: 650.0000 mg | ORAL_TABLET | ORAL | Status: DC | PRN

## 2022-02-03 MED ORDER — HEPARIN (PORCINE) IN NACL 1000-0.9 UT/500ML-% IV SOLN
INTRAVENOUS | Status: AC
  Filled 2022-02-03: qty 500

## 2022-02-03 MED ORDER — VERAPAMIL HCL 2.5 MG/ML IV SOLN
INTRAVENOUS | Status: AC
  Filled 2022-02-03: qty 2

## 2022-02-03 MED ORDER — SODIUM CHLORIDE 0.9 % WEIGHT BASED INFUSION
3.0000 mL/kg/h | INTRAVENOUS | Status: AC
  Administered 2022-02-03: 3 mL/kg/h via INTRAVENOUS

## 2022-02-03 MED ORDER — ONDANSETRON HCL 4 MG/2ML IJ SOLN: 4.0000 mg | Freq: Four times a day (QID) | INTRAMUSCULAR | Status: DC | PRN

## 2022-02-03 MED ORDER — HYDRALAZINE HCL 20 MG/ML IJ SOLN: 10.0000 mg | INTRAMUSCULAR | Status: DC | PRN

## 2022-02-03 MED ORDER — SODIUM CHLORIDE 0.9 % IV SOLN: 250.0000 mL | INTRAVENOUS | Status: DC | PRN

## 2022-02-03 MED ORDER — MIDAZOLAM HCL 2 MG/2ML IJ SOLN
INTRAMUSCULAR | Status: DC | PRN
  Administered 2022-02-03: 1 mg via INTRAVENOUS

## 2022-02-03 MED ORDER — LIDOCAINE HCL (PF) 1 % IJ SOLN
INTRAMUSCULAR | Status: DC | PRN
  Administered 2022-02-03 (×2): 2 mL via INTRADERMAL

## 2022-02-03 MED ORDER — FENTANYL CITRATE (PF) 100 MCG/2ML IJ SOLN
INTRAMUSCULAR | Status: AC
  Filled 2022-02-03: qty 2

## 2022-02-03 MED ORDER — LABETALOL HCL 5 MG/ML IV SOLN: 10.0000 mg | INTRAVENOUS | Status: DC | PRN

## 2022-02-03 MED ORDER — HEPARIN (PORCINE) IN NACL 1000-0.9 UT/500ML-% IV SOLN
INTRAVENOUS | Status: DC | PRN
  Administered 2022-02-03 (×2): 500 mL

## 2022-02-03 MED ORDER — FENTANYL CITRATE (PF) 100 MCG/2ML IJ SOLN
INTRAMUSCULAR | Status: DC | PRN
  Administered 2022-02-03: 25 ug via INTRAVENOUS

## 2022-02-03 MED ORDER — LIDOCAINE HCL (PF) 1 % IJ SOLN
INTRAMUSCULAR | Status: AC
  Filled 2022-02-03: qty 30

## 2022-02-03 MED ORDER — VERAPAMIL HCL 2.5 MG/ML IV SOLN
INTRAVENOUS | Status: DC | PRN
  Administered 2022-02-03 (×2): 10 mL via INTRA_ARTERIAL

## 2022-02-03 MED ORDER — HEPARIN SODIUM (PORCINE) 1000 UNIT/ML IJ SOLN
INTRAMUSCULAR | Status: DC | PRN
  Administered 2022-02-03: 4000 [IU] via INTRAVENOUS

## 2022-02-03 MED ORDER — ASPIRIN 81 MG PO CHEW: 81.0000 mg | CHEWABLE_TABLET | ORAL | Status: DC

## 2022-02-03 MED ORDER — SODIUM CHLORIDE 0.9% FLUSH: 3.0000 mL | INTRAVENOUS | Status: DC | PRN

## 2022-02-03 MED ORDER — APIXABAN 5 MG PO TABS: 5.0000 mg | ORAL_TABLET | Freq: Two times a day (BID) | ORAL | 11 refills | Status: DC

## 2022-02-03 SURGICAL SUPPLY — 13 items
BAND ZEPHYR COMPRESS 30 LONG (HEMOSTASIS) IMPLANT
CATH 5FR JL3.5 JR4 ANG PIG MP (CATHETERS) IMPLANT
CATH BALLN WEDGE 5F 110CM (CATHETERS) IMPLANT
CATH INFINITI 5F JL4 125CM (CATHETERS) IMPLANT
GLIDESHEATH SLEND SS 6F .021 (SHEATH) IMPLANT
GUIDEWIRE INQWIRE 1.5J.035X260 (WIRE) IMPLANT
INQWIRE 1.5J .035X260CM (WIRE) ×1
KIT HEART LEFT (KITS) ×1 IMPLANT
PACK CARDIAC CATHETERIZATION (CUSTOM PROCEDURE TRAY) ×1 IMPLANT
SHEATH GLIDE SLENDER 4/5FR (SHEATH) IMPLANT
SHEATH PROBE COVER 6X72 (BAG) IMPLANT
TRANSDUCER W/STOPCOCK (MISCELLANEOUS) ×1 IMPLANT
TUBING CIL FLEX 10 FLL-RA (TUBING) ×1 IMPLANT

## 2022-02-03 NOTE — Interval H&P Note (Signed)
Cath Lab Visit (complete for each Cath Lab visit)  Clinical Evaluation Leading to the Procedure:   ACS: No.  Non-ACS:    Anginal Classification: CCS III  Anti-ischemic medical therapy: Minimal Therapy (1 class of medications)  Non-Invasive Test Results: High-risk stress test findings: cardiac mortality >3%/year  Prior CABG: No previous CABG   Severe AS   History and Physical Interval Note:  02/03/2022 9:45 AM  Devin Buckley  has presented today for surgery, with the diagnosis of aortic stenosis.  The various methods of treatment have been discussed with the patient and family. After consideration of risks, benefits and other options for treatment, the patient has consented to  Procedure(s): RIGHT/LEFT HEART CATH AND CORONARY ANGIOGRAPHY (N/A) as a surgical intervention.  The patient's history has been reviewed, patient examined, no change in status, stable for surgery.  I have reviewed the patient's chart and labs.  Questions were answered to the patient's satisfaction.     Larae Grooms

## 2022-02-03 NOTE — Progress Notes (Signed)
TR BAND REMOVAL  LOCATION:    right radial  DEFLATED PER PROTOCOL:    Yes.    TIME BAND OFF / DRESSING APPLIED:    1240 gauze dressing applied   SITE UPON ARRIVAL:    Level 0  SITE AFTER BAND REMOVAL:    Level 0  CIRCULATION SENSATION AND MOVEMENT:    Within Normal Limits   Yes.    COMMENTS:   no issues noted

## 2022-02-06 ENCOUNTER — Other Ambulatory Visit: Payer: Self-pay

## 2022-02-06 ENCOUNTER — Telehealth: Payer: Self-pay | Admitting: Interventional Cardiology

## 2022-02-06 LAB — POCT I-STAT 7, (LYTES, BLD GAS, ICA,H+H)
Acid-Base Excess: 6 mmol/L — ABNORMAL HIGH (ref 0.0–2.0)
Bicarbonate: 31.3 mmol/L — ABNORMAL HIGH (ref 20.0–28.0)
Calcium, Ion: 1.14 mmol/L — ABNORMAL LOW (ref 1.15–1.40)
HCT: 38 % — ABNORMAL LOW (ref 39.0–52.0)
Hemoglobin: 12.9 g/dL — ABNORMAL LOW (ref 13.0–17.0)
O2 Saturation: 90 %
Potassium: 3.3 mmol/L — ABNORMAL LOW (ref 3.5–5.1)
Sodium: 142 mmol/L (ref 135–145)
TCO2: 33 mmol/L — ABNORMAL HIGH (ref 22–32)
pCO2 arterial: 45.4 mmHg (ref 32–48)
pH, Arterial: 7.447 (ref 7.35–7.45)
pO2, Arterial: 56 mmHg — ABNORMAL LOW (ref 83–108)

## 2022-02-06 LAB — POCT I-STAT EG7
Acid-Base Excess: 8 mmol/L — ABNORMAL HIGH (ref 0.0–2.0)
Bicarbonate: 33.3 mmol/L — ABNORMAL HIGH (ref 20.0–28.0)
Calcium, Ion: 1.19 mmol/L (ref 1.15–1.40)
HCT: 39 % (ref 39.0–52.0)
Hemoglobin: 13.3 g/dL (ref 13.0–17.0)
O2 Saturation: 73 %
Potassium: 3.4 mmol/L — ABNORMAL LOW (ref 3.5–5.1)
Sodium: 141 mmol/L (ref 135–145)
TCO2: 35 mmol/L — ABNORMAL HIGH (ref 22–32)
pCO2, Ven: 49.7 mmHg (ref 44–60)
pH, Ven: 7.435 — ABNORMAL HIGH (ref 7.25–7.43)
pO2, Ven: 38 mmHg (ref 32–45)

## 2022-02-06 MED ORDER — METOPROLOL TARTRATE 100 MG PO TABS: ORAL_TABLET | ORAL | 0 refills | Status: DC

## 2022-02-06 MED FILL — Heparin Sod (Porcine)-NaCl IV Soln 1000 Unit/500ML-0.9%: INTRAVENOUS | Qty: 500 | Status: AC

## 2022-02-06 NOTE — Telephone Encounter (Signed)
Patient called stating he recently had surgery and he also has a CT Cardiac test on Wednesday.  Patient wants to know if it will be OK to resume his blood thinner medication or wait until after the test.

## 2022-02-06 NOTE — Telephone Encounter (Signed)
Pt is asking if he needs to hold his eliquis prior to his Cardiac CT on 8/23. Pt is aware that we do not interrupt blood thinners for Cardiac CT's.  He is aware that he should continue taking his eliquis bid with no hold prior to or after his Cardiac CT on 8/23.  Pt aware we will always instruct him on hold of this medication, if needed for certain test/procedures.  Pt verbalized understanding and agrees with this plan.

## 2022-02-08 ENCOUNTER — Ambulatory Visit (HOSPITAL_COMMUNITY)
Admission: RE | Admit: 2022-02-08 | Discharge: 2022-02-08 | Disposition: A | Discharge status: Home / Self Care | Payer: Medicare Other | Source: Ambulatory Visit | Attending: Cardiology | Admitting: Cardiology

## 2022-02-08 DIAGNOSIS — I350 Nonrheumatic aortic (valve) stenosis: Secondary | ICD-10-CM | POA: Diagnosis present

## 2022-02-08 MED ORDER — IOHEXOL 350 MG/ML SOLN
100.0000 mL | Freq: Once | INTRAVENOUS | Status: AC | PRN
Start: 2022-02-08 — End: 2022-02-08
  Administered 2022-02-08: 100 mL via INTRAVENOUS

## 2022-02-10 ENCOUNTER — Encounter: Payer: Self-pay | Admitting: Physician Assistant

## 2022-02-10 NOTE — Progress Notes (Signed)
Procedure Type: Isolated AVR Perioperative Outcome Estimate % Operative Mortality 1.73% Morbidity & Mortality 8.24% Stroke 2.31% Renal Failure 1.52% Reoperation 3.93% Prolonged Ventilation 3.26% Deep Sternal Wound Infection 0.044% [facility] Stay (>14 days) 3.04% [facility] Stay (<6 days)* 37.9%

## 2022-02-20 NOTE — Progress Notes (Addendum)
[facility] Consult Note  Chief Complaint  Patient presents with   New Patient (Initial Visit)    Severe aortic stenosis    History of Present Illness: 81 yo male with history of CAD, anxiety, aortic atherosclerosis, permanent atrial fibrillation, gout, hyperlipidemia and severe aortic stenosis who is here today as a new consult, referred by Dr. Irish Lack, for further discussion regarding his aortic stenosis and possible TAVR. He has been followed for severe aortic stenosis for several years. Echo June 2023 with LVEF=50-55%. Normal RV function. Trivial MR. The aortic valve is thickened and calcified with poor leaflet excursion, mean gradient 63 mmHg, peak gradient 93.7 mmHg, AVA 0.51 cm2, DI 0.13, SVI 28. This is consistent with severe/critical AS. Cardiac cath 02/03/22 with mild CAD. Moderate pulmonary HTN. Cardiac CT 02/08/22 with anatomy suitable for a 29 mm Edwards Sapien 3 THV.   He tells me today that he is having progressive fatigue. He has no chest pain or dyspnea. He has LE edema. He has no dental issues. He lives with his wife. He is still working and is in [employer] fund[employer].   Primary Care Physician: Rusty Aus, MD Primary Cardiologist: Irish Lack Referring Cardiologist: Irish Lack  Past Medical History:  Diagnosis Date   Anxiety    Aortic atherosclerosis [facility])    Atrial fibrillation, chronic ([place])    Cataract    ED (erectile dysfunction)    Gout    History of kidney stones    Hyperlipidemia    Hyperlipidemia    Lumbar disc disease    Nephrolithiasis    Patellofemoral arthritis     Past Surgical History:  Procedure Laterality Date   ACHILLES TENDON REPAIR Left    arthroscopic shoulder  Right    BROW LIFT Bilateral 04/08/2018   Procedure: BLEPHAROPLASTY BILATERAL LOWER LID;  Surgeon: Bea Graff, MD;  Location: [facility];  Service: Plastics;  Laterality: Bilateral;   INSERTION OF MESH  04/27/2021   Procedure: INSERTION OF MESH;  Surgeon: Herbert Pun, MD;   Location: [facility];  Service: General;;   RHYTIDECTOMY N/A 04/08/2018   Procedure: Malon Kindle;  Surgeon: Bea Graff, MD;  Location: [place];  Service: Plastics;  Laterality: N/A;   RIGHT/LEFT HEART CATH AND CORONARY ANGIOGRAPHY N/A 02/03/2022   Procedure: RIGHT/LEFT HEART CATH AND CORONARY ANGIOGRAPHY;  Surgeon: Jettie Booze, MD;  Location: [place];  Service: Cardiovascular;  Laterality: N/A;    Current Outpatient Medications  Medication Sig Dispense Refill   apixaban (ELIQUIS) 5 MG TABS tablet Take 1 tablet (5 mg total) by mouth 2 (two) times daily. 60 tablet 11   buprenorphine-naloxone (SUBOXONE) 8-2 mg SUBL SL tablet Place 1.5-2 tablets under the tongue daily.     clonazePAM (KLONOPIN) 0.5 MG tablet Take 0.5 mg by mouth 2 (two) times daily.     COLLAGEN PO Take 2 Scoops by mouth 3 (three) times daily.     Digestive Enzyme CAPS Take 2 capsules by mouth 2 (two) times daily.     furosemide (LASIX) 40 MG tablet Take 40-60 mg by mouth daily.     hydrocortisone 2.5 % cream Apply 1 application topically daily as needed for rash.     metoprolol tartrate (LOPRESSOR) 100 MG tablet Take one tablet by mouth at 8:30 AM the morning of CT scan on 8/23 1 tablet 0   Multiple Vitamin (MULTIVITAMIN WITH MINERALS) TABS tablet Take 3 tablets by mouth daily. Total minerals     Probiotic Product (PROBIOTIC PO) Take 3 capsules  by mouth 3 (three) times daily. total flora     VITAMIN D-VITAMIN K PO Take 1 capsule by mouth daily.     No current facility-administered medications for this visit.    No Known Allergies  Social History   Socioeconomic History   Marital status: Married    Spouse name: Not on file   Number of children: Not on file   Years of education: Not on file   Highest education level: Not on file  Occupational History   Not on file  Tobacco Use   Smoking status: Never   Smokeless tobacco: Never  Vaping Use   Vaping Use: Never used  Substance and Sexual Activity    Alcohol use: No   Drug use: No   Sexual activity: Never  Other Topics Concern   Not on file  Social History Narrative   Not on file   Social Determinants of Health   Financial Resource Strain: Not on file  Food Insecurity: Not on file  Transportation Needs: Not on file  Physical Activity: Not on file  Stress: Not on file  Social Connections: Not on file  Intimate Partner Violence: Not on file    Family History  Problem Relation Age of Onset   Colon cancer Mother    Alzheimer's disease Father    CVA Father    Diabetes Brother     Review of Systems:  As stated in the HPI and otherwise negative.   BP 100/68   Pulse 89   Ht '6\' 2"'$  (1.88 m)   Wt 200 lb 6.4 oz (90.9 kg)   SpO2 99%   BMI 25.73 kg/m   Physical Examination: General: Well developed, well nourished, NAD  HEENT: OP clear, mucus membranes moist  SKIN: warm, dry. No rashes. Neuro: No focal deficits  Musculoskeletal: Muscle strength 5/5 all ext  Psychiatric: Mood and affect normal  Neck: No JVD, no carotid bruits, no thyromegaly, no lymphadenopathy.  Lungs:Clear bilaterally, no wheezes, rhonci, crackles Cardiovascular: Regular rate and rhythm.  Loud, harsh, late peaking systolic murmur.  Abdomen:Soft. Bowel sounds present. Non-tender.  Extremities: 1-2+ bilateral lower extremity edema. Pulses are 2 + in the bilateral DP/PT.  EKG:  EKG is not ordered today. The ekg ordered today demonstrates   Echo 12/06/21:  1. Atrial fibrillation controlled rate. Left ventricular ejection  fraction, by estimation, is 50 to 55%. The left ventricle has low normal  function. The left ventricle has no regional wall motion abnormalities.  There is mild concentric left ventricular  hypertrophy. Left ventricular diastolic parameters are indeterminate.   2. Right ventricular systolic function is normal. The right ventricular  size is normal. There is severely elevated pulmonary artery systolic  pressure.   3. Left atrial size  was severely dilated.   4. Right atrial size was severely dilated.   5. A small pericardial effusion is present. The pericardial effusion is  localized near the right atrium.   6. The mitral valve is degenerative. Trivial mitral valve regurgitation.  No evidence of mitral stenosis.   7. There are peak velocities exceeding 5 m/sec2 with longer R-R intervals  suggesting critical AS. The aortic valve is tricuspid. There is moderate  calcification of the aortic valve. There is severe thickening of the  aortic valve. Aortic valve  regurgitation is trivial. Severe aortic valve stenosis.   8. There is moderate dilatation of the ascending aorta, measuring 44 mm.   9. Severely dilated IVC. The inferior vena cava is dilated in size  with  <50% respiratory variability, suggesting right atrial pressure of 15 mmHg.   FINDINGS   Left Ventricle: Atrial fibrillation controlled rate. Left ventricular  ejection fraction, by estimation, is 50 to 55%. The left ventricle has low  normal function. The left ventricle has no regional wall motion  abnormalities. Definity contrast agent was  given IV to delineate the left ventricular endocardial borders. Global  longitudinal strain performed but not reported based on interpreter  judgement due to suboptimal tracking. The left ventricular internal cavity  size was normal in size. There is mild  concentric left ventricular hypertrophy. Left ventricular diastolic  parameters are indeterminate. Indeterminate filling pressures.   Right Ventricle: The right ventricular size is normal. No increase in  right ventricular wall thickness. Right ventricular systolic function is  normal. There is severely elevated pulmonary artery systolic pressure. The  tricuspid regurgitant velocity is  3.64 m/s, and with an assumed right atrial pressure of 20 mmHg, the  estimated right ventricular systolic pressure is 12.8 mmHg.   Left Atrium: Left atrial size was severely dilated.    Right Atrium: Right atrial size was severely dilated.   Pericardium: A small pericardial effusion is present. The pericardial  effusion is localized near the right atrium.   Mitral Valve: The mitral valve is degenerative in appearance. Mild mitral  annular calcification. Trivial mitral valve regurgitation. No evidence of  mitral valve stenosis.   Tricuspid Valve: The tricuspid valve is normal in structure. Tricuspid  valve regurgitation is mild . No evidence of tricuspid stenosis.   Aortic Valve: There are peak velocities exceeding 5 m/sec2 with longer R-R  intervals suggesting critical AS. The aortic valve is tricuspid. There is  moderate calcification of the aortic valve. There is severe thickening of  the aortic valve. Aortic valve  regurgitation is trivial. Severe aortic stenosis is present. Aortic valve  mean gradient measures 63.0 mmHg. Aortic valve peak gradient measures 93.7  mmHg. Aortic valve area, by VTI measures 0.53 cm.   Pulmonic Valve: The pulmonic valve was normal in structure. Pulmonic valve  regurgitation is not visualized. No evidence of pulmonic stenosis.   Aorta: The aortic arch was not well visualized and the aortic root is  normal in size and structure. There is moderate dilatation of the  ascending aorta, measuring 44 mm.   Venous: Severely dilated IVC. The inferior vena cava is dilated in size  with less than 50% respiratory variability, suggesting right atrial  pressure of 15 mmHg.   IAS/Shunts: No atrial level shunt detected by color flow Doppler.      LEFT VENTRICLE  PLAX 2D  LVIDd:         5.50 cm      Diastology  LVIDs:         4.30 cm      LV e' medial:    5.11 cm/s  LV PW:         1.30 cm      LV E/e' medial:  30.7  LV IVS:        1.30 cm      LV e' lateral:   7.94 cm/s  LVOT diam:     2.30 cm      LV E/e' lateral: 19.8  LV SV:         61  LV SV Index:   28  LVOT Area:     4.15 cm     LV Volumes (MOD)  LV vol d, MOD A2C: 140.0 ml  LV vol d, MOD A4C: 96.2 ml  LV vol s, MOD A2C: 64.3 ml  LV vol s, MOD A4C: 58.0 ml  LV SV MOD A2C:     75.7 ml  LV SV MOD A4C:     96.2 ml  LV SV MOD BP:      58.6 ml   RIGHT VENTRICLE             IVC  RV S prime:     10.10 cm/s  IVC diam: 3.40 cm  TAPSE (M-mode): 2.8 cm   LEFT ATRIUM              Index        RIGHT ATRIUM           Index  LA diam:        5.40 cm  2.50 cm/m   RA Area:     34.70 cm  LA Vol (A2C):   111.0 ml 51.29 ml/m  RA Volume:   133.00 ml 61.45 ml/m  LA Vol (A4C):   131.0 ml 60.53 ml/m  LA Biplane Vol: 127.0 ml 58.68 ml/m   AORTIC VALVE  AV Area (Vmax):    0.61 cm  AV Area (Vmean):   0.51 cm  AV Area (VTI):     0.53 cm  AV Vmax:           484.00 cm/s  AV Vmean:          382.000 cm/s  AV VTI:            1.150 m  AV Peak Grad:      93.7 mmHg  AV Mean Grad:      63.0 mmHg  LVOT Vmax:         70.90 cm/s  LVOT Vmean:        47.000 cm/s  LVOT VTI:          0.147 m  LVOT/AV VTI ratio: 0.13     AORTA  Ao Root diam:  3.80 cm  Ao Sinus diam: 3.60 cm  Ao STJ diam:   3.7 cm  Ao Asc diam:   4.40 cm  Ao Desc diam:  2.50 cm   MITRAL VALVE                TRICUSPID VALVE  MV Area (PHT): 6.12 cm     TR Peak grad:   53.0 mmHg  MV Decel Time: 124 msec     TR Vmax:        364.00 cm/s  MV E velocity: 157.00 cm/s                              SHUNTS                              Systemic VTI:  0.15 m                              Systemic Diam: 2.30 cm   Cardiac cath 02/03/22: RIGHT/LEFT HEART CATH AND CORONARY ANGIOGRAPHY   Conclusion      Prox LAD to Mid LAD lesion is 25% stenosed.   Hemodynamic findings consistent with moderate pulmonary hypertension.   Aortic valve not crossed due to known severe aortic stenosis requiring valve replacement.   Nonobstructive, mild coronary artery disease.  Moderate pulmonary artery hypertension.   TAVR team contacted.  CT scans to be arranged.   Results conveyed to wife, Suanne Marker.          Recommendations  Antiplatelet/Anticoag Recommend to resume Apixaban, at currently prescribed dose and frequency on 02/04/2022. Concurrent antiplatelet therapy not recommended.  Discharge Date In the absence of any other complications or medical issues, we expect the patient to be ready for discharge from a cath perspective on 02/03/2022.   Indications  Nonrheumatic aortic valve stenosis [I35.0 (ICD-10-CM)]   Clinical Presentation  CHF/Shock Congestive heart failure not present. No shock present.   Procedural Details  Technical Details The risks, benefits, and details of the procedure were explained to the patient.  The patient verbalized understanding and wanted to proceed.  Informed written consent was obtained.  PROCEDURE TECHNIQUE:  After Xylocaine anesthesia, a 5 French sheath was placed in the right antecubital area and exchanged for a peripheral IV. A 5 French balloontipped Swan-Ganz catheter was advanced to the pulmonary artery under fluoroscopic guidance. Hemodynamic pressures were obtained. Oxygen saturations were obtained. After Xylocaine anesthesia, a 57F sheath was placed in the right radial artery with a single anterior needle wall stick using ultrasound guidance.  Of note, he is tall and he has long arms.  The JR4 catheter barely reached. Right coronary angiography was done using a Judkins R4 guide catheter. Left coronary angiography was done using a Judkins L4 guide catheter, 125 cm.    Left heart cath was not done due to known severe aortic stenosis.      Contrast: 55 cc   Estimated blood loss <50 mL.   During this procedure medications were administered to achieve and maintain moderate conscious sedation while the patient's heart rate, blood pressure, and oxygen saturation were continuously monitored and I was present face-to-face 100% of this time.   Medications (Filter: Administrations occurring from 0935 to 1044 on 02/03/22) fentaNYL (SUBLIMAZE) injection (mcg) Total  dose:  25 mcg  Date/Time Rate/Dose/Volume Action   02/03/22 0951 25 mcg Given    midazolam (VERSED) injection (mg) Total dose:  1 mg  Date/Time Rate/Dose/Volume Action   02/03/22 0952 1 mg Given    lidocaine (PF) (XYLOCAINE) 1 % injection (mL) Total volume:  4 mL  Date/Time Rate/Dose/Volume Action   02/03/22 1014 2 mL Given   1015 2 mL Given    Radial Cocktail/Verapamil only (mL) Total volume:  20 mL  Date/Time Rate/Dose/Volume Action   02/03/22 1018 10 mL Given   1035 10 mL Given    heparin sodium (porcine) injection (Units) Total dose:  4,000 Units  Date/Time Rate/Dose/Volume Action   02/03/22 1024 4,000 Units Given    Heparin (Porcine) in NaCl 1000-0.9 UT/500ML-% SOLN (mL) Total volume:  1,000 mL  Date/Time Rate/Dose/Volume Action   02/03/22 1035 500 mL Given   1035 500 mL Given    iohexol (OMNIPAQUE) 350 MG/ML injection (mL) Total volume:  55 mL  Date/Time Rate/Dose/Volume Action   02/03/22 1040 55 mL Given    Sedation Time  Sedation Time Physician-1: 43 minutes 13 seconds Contrast  Medication Name Total Dose  iohexol (OMNIPAQUE) 350 MG/ML injection 55 mL   Radiation/Fluoro  Fluoro time: 4.6 (min) DAP: 18.3 (Gycm2) Cumulative Air Kerma: 272.5 (mGy) Complications  Complications documented before study signed (02/03/2022 36:64 AM)   No complications were associated with this study.  Documented by Bethann Punches, RN - 02/03/2022 10:39 AM     Coronary Findings  Diagnostic Dominance: Right Left Anterior  Descending  Prox LAD to Mid LAD lesion is 25% stenosed.    Left Circumflex  The vessel exhibits minimal luminal irregularities.    Right Coronary Artery  The vessel exhibits minimal luminal irregularities.    Intervention   No interventions have been documented.   Right Heart  Right Heart Pressures Hemodynamic findings consistent with moderate pulmonary hypertension. Aortic saturation 90%, PA saturation 73%, PA pressure 65/21, mean PA  pressure 40 mmHg, pulmonary capillary wedge pressure 19 mmHg, cardiac output 8.4 L/min, cardiac index 3.94.   Left Heart  Left Ventricle The left ventricle is mildly dilated.  Aortic Valve The aortic valve is calcified.   Coronary Diagrams  Diagnostic Dominance: Right  Intervention  Implants     No implant documentation for this case.   Syngo Images   Show images for CARDIAC CATHETERIZATION Images on Long Term Storage   Show images for Tavaughn, Devin Buckley to Procedure Log  Procedure Log    Hemo Data  Flowsheet Row Most Recent Value  Fick Cardiac Output 8.4 L/min  Fick Cardiac Output Index 3.94 (L/min)/BSA  RA A Wave 15 mmHg  RA V Wave 14 mmHg  RA Mean 10 mmHg  RV Systolic Pressure 62 mmHg  RV Diastolic Pressure 1 mmHg  RV EDP 9 mmHg  PA Systolic Pressure 65 mmHg  PA Diastolic Pressure 21 mmHg  PA Mean 40 mmHg  PW A Wave 25 mmHg  PW V Wave 31 mmHg  PW Mean 19 mmHg  AO Systolic Pressure 161 mmHg  AO Diastolic Pressure 48 mmHg  AO Mean 68 mmHg  QP/QS 1  TPVR Index 10.15 HRUI   Cardiac CT 02/08/22: EXAM: Cardiac TAVR CT   TECHNIQUE: The patient was scanned on a Siemens Force 096 slice scanner. A 120 kV retrospective scan was triggered in the ascending thoracic aorta at 140 HU's. Gantry rotation speed was 250 msecs and collimation was .6 mm. No beta blockade or nitro were given. The 3D data set was reconstructed in 5% intervals of the R-R cycle. Systolic and diastolic phases were analyzed on a dedicated work station using MPR, MIP and VRT modes. The patient received 80 cc of contrast.   FINDINGS: Aortic Valve: Heavily calcified tri leaflet AV score 5996   Aorta: Dilated ascending thoracic aorta. Normal arch vessels moderate calcific atherosclerosis   Sino-tubular Junction: 33 mm   Ascending Thoracic Aorta: Dilated 41 mm   Aortic Arch: 28 mm   Descending Thoracic Aorta: 29 mm   Sinus of Valsalva Measurements:   Non-coronary: 36.7 mm   Right -  coronary: 35.6 mm   Left -   coronary: 33.9 mm   Coronary Artery Height above Annulus:   Left Main: 14.3 mm above annulus   Right Coronary: 14 mm above annulus   Virtual Basal Annulus Measurements:   Maximum / Minimum Diameter: 30.9 mm x 22.8 mm   Perimeter: 90.7 mm   Area: 595 mm2   Coronary Arteries: Sufficient height above annulus for deployment   Optimum Fluoroscopic Angle for Delivery: LAO 14 Caudal 14   IMPRESSION: 1. Tri leaflet AV with calcium score 5996   2.  Annular area of 595 mm2 suitable for a 29 mm Sapien 3 valve   3.  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 Optimum angiographic angle for deployment LAO 14 Caudal 14 degrees   5.  Membranous septal length 12.3 mm  CT abdomen/pelvis 02/08/22: EXAM: CTA ABDOMEN AND PELVIS WITHOUT AND WITH CONTRAST  TECHNIQUE: Multidetector CT imaging of the abdomen and pelvis was performed using the standard protocol during bolus administration of intravenous contrast. Multiplanar reconstructed images and MIPs were obtained and reviewed to evaluate the vascular anatomy.   RADIATION DOSE REDUCTION: This exam was performed according to the departmental dose-optimization program which includes automated exposure control, adjustment of the mA and/or kV according to patient size and/or use of iterative reconstruction technique.   CONTRAST:  177m OMNIPAQUE IOHEXOL 350 MG/ML SOLN   COMPARISON:  None Available.   CTA CHEST FINDINGS   Cardiovascular: Cardiomegaly. Trace pericardial recess fluid. Aortic valve thickening and calcifications. Normal caliber thoracic aorta with moderate calcified and noncalcified plaque.Left main and 3 vessel coronary artery disease.   Mediastinum/Nodes: Esophagus and thyroid are unremarkable. No pathologically enlarged lymph nodes seen in the chest.   Lungs/Pleura: Central airways are patent. Moderate right and trace left pleural effusions with bibasilar atelectasis. No evidence  of pneumothorax.   Musculoskeletal: No chest wall abnormality. No acute or significant osseous findings.   CTA ABDOMEN AND PELVIS FINDINGS   Hepatobiliary: No focal liver abnormality is seen. No gallstones, gallbladder wall thickening, or biliary dilatation.   Pancreas: Unremarkable. No pancreatic ductal dilatation or surrounding inflammatory changes.   Spleen: Normal in size without focal abnormality.   Adrenals/Urinary Tract: Bilateral adrenal glands are unremarkable. Bilateral simple renal cysts, no further follow-up imaging is recommended. Bladder is unremarkable.   Stomach/Bowel: Stomach is within normal limits. Appendix appears normal. Severe diverticulosis of the sigmoid colon. No evidence of bowel wall thickening, distention, or inflammatory changes.   Vascular/lymphatic: Normal caliber abdominal aorta with severe predominantly calcified plaque. Focal linear filling defect of the infrarenal abdominal aorta seen on series 3 image 378. Dilation and asymmetric opacification of the right iliac veins and numerous small adjacent collateral vessels. External iliac vein measures up to 3.7 cm. Major aortic branch vessels are patent. No pathologically enlarged lymph nodes seen in the chest.   Reproductive: Unremarkable.   Other: Small left-greater-than-right fat containing inguinal hernias. Diffuse soft tissue anasarca. Trace abdominopelvic ascites.   Musculoskeletal: No acute or significant osseous findings.   VASCULAR MEASUREMENTS PERTINENT TO TAVR:   AORTA:   Minimal Aortic Diameter -   mm   Severity of Aortic Calcification-severe   RIGHT PELVIS:   Right Common Iliac Artery -   Minimal Diameter-9.4 mm   Tortuosity-severe   Calcification-severe   Right External Iliac Artery -   Minimal Diameter-10.1 mm   Tortuosity-severe   Calcification-moderate   Right Common Femoral Artery -   Minimal Diameter-9.6 mm   Tortuosity-none   Calcification-mild    LEFT PELVIS:   Left Common Iliac Artery -   Minimal Diameter-11.6 mm   Tortuosity-severe   Calcification-severe   Left External Iliac Artery -   Minimal Diameter-10.1 mm   Tortuosity-severe   Calcification-moderate   Left Common Femoral Artery -   Minimal Diameter-10.7 mm   Tortuosity-none   Calcification-moderate   Review of the MIP images confirms the above findings.   IMPRESSION: VASCULAR   1. Vascular findings and measurements pertinent to potential TAVR procedure, as detailed above. 2. Thickening and calcification of the aortic valve, compatible with reported clinical history of aortic stenosis. 3. 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 4. 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 MRI/MRA assist with further characterization. 5. Focal linear  filling defect of the infrarenal abdominal aorta, differential considerations include small focal dissection or linear intraluminal plaque.   NON-VASCULAR   1. Moderate right and small left pleural effusions with atelectasis. 2. Trace abdominopelvic ascites and diffuse soft tissue anasarca.   Recent Labs: 07/07/2021: ALT 25 01/30/2022: BUN 26; Creatinine, Ser 1.02; Platelets 134 02/03/2022: Hemoglobin 13.3; Hemoglobin 13.3; Potassium 3.4; Potassium 3.4; Sodium 141; Sodium 141     Wt Readings from Last 3 Encounters:  02/21/22 200 lb 6.4 oz (90.9 kg)  02/03/22 195 lb (88.5 kg)  01/30/22 191 lb (86.6 kg)     Assessment and Plan:   1. Severe Aortic Valve Stenosis: He has severe, stage D1 aortic valve stenosis. He is NYHA class II. I have personally reviewed the echo images. The aortic valve is thickened, calcified with limited leaflet mobility. I think he would benefit from AVR. Given advanced age, he is not a  good candidate for conventional AVR by surgical approach. I think he may be a good candidate for TAVR.   I have reviewed the natural history of aortic stenosis with the patient and their family members  who are present today. We have discussed the limitations of medical therapy and the poor prognosis associated with symptomatic aortic stenosis. We have reviewed potential treatment options, including palliative medical therapy, conventional surgical aortic valve replacement, and transcatheter aortic valve replacement. We discussed treatment options in the context of the patient's specific comorbid medical conditions.   He would like to proceed with planning for TAVR. He has completed his cardiac cath and CT scans. Risks and benefits of the valve procedure are reviewed with the patient. He has been seen by Dr. Cyndia Bent. Will plan TAVR on 02/28/22.   Labs/ tests ordered today include:  No orders of the defined types were placed in this encounter.  Disposition:   F/U with the valve team.   Signed, Lauree Chandler, MD 02/21/2022 1:22 PM    [facility] [place], [place], [place]  [address] Phone: [phone]; Fax: [phone]

## 2022-02-21 ENCOUNTER — Other Ambulatory Visit: Payer: Self-pay

## 2022-02-21 ENCOUNTER — Encounter: Payer: Self-pay | Admitting: Cardiovascular Disease

## 2022-02-21 ENCOUNTER — Ambulatory Visit: Payer: Medicare Other | Attending: Cardiovascular Disease | Admitting: Cardiovascular Disease

## 2022-02-21 VITALS — BP 100/68 | HR 89 | Ht 74.0 in | Wt 200.4 lb

## 2022-02-21 DIAGNOSIS — I350 Nonrheumatic aortic (valve) stenosis: Secondary | ICD-10-CM

## 2022-02-21 NOTE — Patient Instructions (Signed)
Medication Instructions:  Your physician recommends that you continue on your current medications as directed. Please refer to the Current Medication list given to you today.  *If you need a refill on your cardiac medications before your next appointment, please call your pharmacy*   Lab Work: none If you have labs (blood work) drawn today and your tests are completely normal, you will receive your results only by: [place] (if you have MyChart) OR A paper copy in the mail If you have any lab test that is abnormal or we need to change your treatment, we will call you to review the results.   Testing/Procedures: none   Follow-Up: At [facility], you and your health needs are our priority.  As part of our continuing mission to provide you with exceptional heart care, we have created designated Provider Care Teams.  These Care Teams include your primary Cardiologist (physician) and Advanced Practice Providers (APPs -  Physician Assistants and Nurse Practitioners) who all work together to provide you with the care you need, when you need it.  We recommend signing up for the patient portal called "MyChart".  Sign up information is provided on this After Visit Summary.  MyChart is used to connect with patients for Virtual Visits (Telemedicine).  Patients are able to view lab/test results, encounter notes, upcoming appointments, etc.  Non-urgent messages can be sent to your provider as well.   To learn more about what you can do with MyChart, go to [email or web address].    Your next appointment:   After procedure  The format for your next appointment:   In person Provider:      Other Instructions   See instruction sheet  Important Information About Sugar

## 2022-02-21 NOTE — Progress Notes (Signed)
Pre Surgical Assessment: 5 M Walk Test  68M=16.54f  5 Meter Walk Test- trial 1: 4.44 seconds 5 Meter Walk Test- trial 2: 5.39 seconds 5 Meter Walk Test- trial 3: 4.86 seconds 5 Meter Walk Test Average: 4.90 seconds

## 2022-02-23 NOTE — Pre-Procedure Instructions (Signed)
Surgical Instructions    Your procedure is scheduled on Tuesday, February 28, 2022 at 10:45 AM.  Report to [facility] Main Entrance "A" at 8:45 A.M., then check in with the Admitting office.  Call this number if you have problems the morning of surgery:  [phone]   If you have any questions prior to your surgery date call [phone]: Open Monday-Friday 8am-4pm    Remember:  Do not eat or drink after midnight the night before your surgery  Continue taking all other medications without change through the day before surgery. On the morning of surgery do not take any medications.    Stop taking Eliquis on 9/7, Thursday, You will take your last dose on 9/6 Wednesday.  As of today, STOP taking any Aspirin (unless otherwise instructed by your surgeon) Aleve, Naproxen, Ibuprofen, Motrin, Advil, Goody's, BC's, all herbal medications, fish oil, and all vitamins.                     Do NOT Smoke (Tobacco/Vaping) for 24 hours prior to your procedure.  If you use a CPAP at night, you may bring your mask/headgear for your overnight stay.   Contacts, glasses, piercing's, hearing aid's, dentures or partials may not be worn into surgery, please bring cases for these belongings.    For patients admitted to the hospital, discharge time will be determined by your treatment team.   Patients discharged the day of surgery will not be allowed to drive home, and someone needs to stay with them for 24 hours.  SURGICAL WAITING ROOM VISITATION Patients having surgery or a procedure may have two support people in the waiting area. Visitors may stay in the waiting area during the procedure and switch out with other visitors if needed. Children under the age of 36 must have an adult accompany them who is not the patient. If the patient needs to stay at the hospital during part of their recovery, the visitor guidelines for inpatient rooms apply.  Please refer to the [facility] website for the visitor  guidelines for Inpatients (after your surgery is over and you are in a regular room).    Special instructions:   [place]- Preparing For Surgery  Before surgery, you can play an important role. Because skin is not sterile, your skin needs to be as free of germs as possible. You can reduce the number of germs on your skin by washing with CHG (chlorahexidine gluconate) Soap before surgery.  CHG is an antiseptic cleaner which kills germs and bonds with the skin to continue killing germs even after washing.    Oral Hygiene is also important to reduce your risk of infection.  Remember - BRUSH YOUR TEETH THE MORNING OF SURGERY WITH YOUR REGULAR TOOTHPASTE  Please do not use if you have an allergy to CHG or antibacterial soaps. If your skin becomes reddened/irritated stop using the CHG.  Do not shave (including legs and underarms) for at least 48 hours prior to first CHG shower. It is OK to shave your face.  Please follow these instructions carefully.   Shower the NIGHT BEFORE SURGERY and the MORNING OF SURGERY  If you chose to wash your hair, wash your hair first as usual with your normal shampoo.  After you shampoo, rinse your hair and body thoroughly to remove the shampoo.  Use CHG Soap as you would any other liquid soap. You can apply CHG directly to the skin and wash gently with a scrungie or a clean  washcloth.   Apply the CHG Soap to your body ONLY FROM THE NECK DOWN.  Do not use on open wounds or open sores. Avoid contact with your eyes, ears, mouth and genitals (private parts). Wash Face and genitals (private parts)  with your normal soap.   Wash thoroughly, paying special attention to the area where your surgery will be performed.  Thoroughly rinse your body with warm water from the neck down.  DO NOT shower/wash with your normal soap after using and rinsing off the CHG Soap.  Pat yourself dry with a CLEAN TOWEL.  Wear CLEAN PAJAMAS to bed the night before surgery  Place CLEAN  SHEETS on your bed the night before your surgery  DO NOT SLEEP WITH PETS.   Day of Surgery: Take a shower with CHG soap. Do not wear jewelry Do not wear lotions, powders, colognes, or deodorant. Men may shave face and neck. Do not bring valuables to the hospital.  [facility] is not responsible for any belongings or valuables. Wear Clean/Comfortable clothing the morning of surgery Do not apply any deodorants/lotions.   Remember to brush your teeth WITH YOUR REGULAR TOOTHPASTE.   Please read over the following fact sheets that you were given.  If you received a COVID test during your pre-op visit  it is requested that you wear a mask when out in public, stay away from anyone that may not be feeling well and notify your surgeon if you develop symptoms. If you have been in contact with anyone that has tested positive in the last 10 days please notify you surgeon.

## 2022-02-24 ENCOUNTER — Other Ambulatory Visit: Payer: Self-pay

## 2022-02-24 ENCOUNTER — Encounter (HOSPITAL_COMMUNITY): Payer: Self-pay

## 2022-02-24 ENCOUNTER — Ambulatory Visit (HOSPITAL_COMMUNITY)
Admission: RE | Admit: 2022-02-24 | Discharge: 2022-02-24 | Disposition: A | Discharge status: Home / Self Care | Payer: Medicare Other | Source: Ambulatory Visit | Attending: Cardiovascular Disease | Admitting: Cardiovascular Disease

## 2022-02-24 ENCOUNTER — Encounter (HOSPITAL_COMMUNITY)
Admission: RE | Admit: 2022-02-24 | Discharge: 2022-02-24 | Disposition: A | Discharge status: Home / Self Care | Payer: Medicare Other | Source: Ambulatory Visit | Attending: Cardiovascular Disease | Admitting: Cardiovascular Disease

## 2022-02-24 VITALS — BP 97/65 | HR 75 | Temp 98.2°F | Resp 17 | Ht 74.0 in | Wt 202.2 lb

## 2022-02-24 DIAGNOSIS — Z01818 Encounter for other preprocedural examination: Secondary | ICD-10-CM | POA: Diagnosis present

## 2022-02-24 DIAGNOSIS — Z20822 Contact with and (suspected) exposure to covid-19: Secondary | ICD-10-CM | POA: Diagnosis not present

## 2022-02-24 DIAGNOSIS — I350 Nonrheumatic aortic (valve) stenosis: Secondary | ICD-10-CM | POA: Diagnosis not present

## 2022-02-24 LAB — URINALYSIS, ROUTINE W REFLEX MICROSCOPIC
Bilirubin Urine: NEGATIVE
Glucose, UA: NEGATIVE mg/dL
Hgb urine dipstick: NEGATIVE
Ketones, ur: NEGATIVE mg/dL
Leukocytes,Ua: NEGATIVE
Nitrite: NEGATIVE
Protein, ur: NEGATIVE mg/dL
Specific Gravity, Urine: 1.018 (ref 1.005–1.030)
pH: 6 (ref 5.0–8.0)

## 2022-02-24 LAB — PROTIME-INR
INR: 1.1 (ref 0.8–1.2)
Prothrombin Time: 14.4 seconds (ref 11.4–15.2)

## 2022-02-24 LAB — CBC
HCT: 46.6 % (ref 39.0–52.0)
Hemoglobin: 14.8 g/dL (ref 13.0–17.0)
MCH: 32.2 pg (ref 26.0–34.0)
MCHC: 31.8 g/dL (ref 30.0–36.0)
MCV: 101.3 fL — ABNORMAL HIGH (ref 80.0–100.0)
Platelets: 132 10*3/uL — ABNORMAL LOW (ref 150–400)
RBC: 4.6 MIL/uL (ref 4.22–5.81)
RDW: 14.1 % (ref 11.5–15.5)
WBC: 4.7 10*3/uL (ref 4.0–10.5)
nRBC: 0 % (ref 0.0–0.2)

## 2022-02-24 LAB — TYPE AND SCREEN
ABO/RH(D): O POS
Antibody Screen: NEGATIVE

## 2022-02-24 LAB — COMPREHENSIVE METABOLIC PANEL
ALT: 18 U/L (ref 0–44)
AST: 43 U/L — ABNORMAL HIGH (ref 15–41)
Albumin: 2.3 g/dL — ABNORMAL LOW (ref 3.5–5.0)
Alkaline Phosphatase: 93 U/L (ref 38–126)
Anion gap: 7 (ref 5–15)
BUN: 23 mg/dL (ref 8–23)
CO2: 33 mmol/L — ABNORMAL HIGH (ref 22–32)
Calcium: 8.5 mg/dL — ABNORMAL LOW (ref 8.9–10.3)
Chloride: 99 mmol/L (ref 98–111)
Creatinine, Ser: 1.08 mg/dL (ref 0.61–1.24)
GFR, Estimated: >60 mL/min (ref >60)
Glucose, Bld: 107 mg/dL — ABNORMAL HIGH (ref 70–99)
Potassium: 3.8 mmol/L (ref 3.5–5.1)
Sodium: 139 mmol/L (ref 135–145)
Total Bilirubin: 0.8 mg/dL (ref 0.3–1.2)
Total Protein: 5.2 g/dL — ABNORMAL LOW (ref 6.5–8.1)

## 2022-02-24 LAB — SURGICAL PCR SCREEN
MRSA, PCR: NEGATIVE
Staphylococcus aureus: POSITIVE — AB

## 2022-02-24 LAB — SARS CORONAVIRUS 2 (TAT 6-24 HRS): SARS Coronavirus 2: NEGATIVE

## 2022-02-24 NOTE — Progress Notes (Signed)
PCP - Dr. Emily Filbert Cardiologist - Dr. Burna Cash  PPM/ICD - Denies  Chest x-ray - 02/24/22 EKG - 01/30/22 Stress Test - Denies ECHO - 12/06/21 Cardiac Cath - 02/03/22  Sleep Study - Denies  Diabetes: Denies  Blood Thinner Instructions: LD Eliquis: 02/22/22 Aspirin Instructions: N/A  ERAS Protcol - No  COVID TEST- 02/24/22 in PAT   Anesthesia review: Yes, cardiac hx  Patient denies shortness of breath, fever, cough and chest pain at PAT appointment   All instructions explained to the patient, with a verbal understanding of the material. Patient agrees to go over the instructions while at home for a better understanding. Patient also instructed to self quarantine after being tested for COVID-19. The opportunity to ask questions was provided.

## 2022-02-27 MED ORDER — NOREPINEPHRINE 4 MG/250ML-% IV SOLN
0.0000 ug/min | INTRAVENOUS | Status: AC
  Administered 2022-02-28: 2 ug/min via INTRAVENOUS
  Filled 2022-02-27: qty 250

## 2022-02-27 MED ORDER — DEXMEDETOMIDINE HCL IN NACL 400 MCG/100ML IV SOLN
0.1000 ug/kg/h | INTRAVENOUS | Status: AC
  Administered 2022-02-28: 45.84 ug via INTRAVENOUS
  Filled 2022-02-27: qty 100

## 2022-02-27 MED ORDER — POTASSIUM CHLORIDE 2 MEQ/ML IV SOLN
80.0000 meq | INTRAVENOUS | Status: DC
  Filled 2022-02-27: qty 40

## 2022-02-27 MED ORDER — CEFAZOLIN SODIUM-DEXTROSE 2-4 GM/100ML-% IV SOLN
2.0000 g | INTRAVENOUS | Status: AC
  Administered 2022-02-28: 2 g via INTRAVENOUS
  Filled 2022-02-27: qty 100

## 2022-02-27 MED ORDER — HEPARIN 30,000 UNITS/1000 ML (OHS) CELLSAVER SOLUTION
Status: DC
  Filled 2022-02-27: qty 1000

## 2022-02-27 MED ORDER — MAGNESIUM SULFATE 50 % IJ SOLN
40.0000 meq | INTRAMUSCULAR | Status: DC
  Filled 2022-02-27: qty 9.85

## 2022-02-27 NOTE — H&P (Signed)
[address]       [place] [address]             [phone]      Cardiothoracic Surgery Admission History and Physical   PCP is Rusty Aus, MD Referring Provider is Casandra Doffing, MD Primary Cardiologist is Larae Grooms, MD   Reason for admission:  Severe aortic stenosis   HPI:   The patient is a 81 year old gentleman with a history of hyperlipidemia, permanent atrial fibrillation with rate control recently on Eliquis, severe aortic stenosis, and chronic combined systolic and diastolic heart failure.  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  He was asymptomatic at that time with daily exercise and continued follow-up was recommended.  A repeat echocardiogram in October 2021 showed a drop in his EF to 45 to 50% with mild LVH and moderate RV systolic dysfunction.  The mean gradient across the aortic valve was 43 mmHg with a peak gradient of 68 mmHg.  Aortic valve area was 0.69 cm.  He continues to remain asymptomatic with good exercise tolerance until June 2022 when he reported increasing lower extremity edema.  His most recent echocardiogram in December 2022 showed an ejection fraction of 45 to 50% with an increase in the mean gradient across the aortic valve to 55 mmHg with the ascending aorta increasing in size to 4.6 cm.   He reports a several month history of increased lower extremity edema, fatigue, and mild shortness of breath with exertion.  He was seen by cardiology in January and noted to have increased lower extremity edema and a small right pleural effusion on chest x-ray.  His Lasix was increased to 80 mg daily for 3 days with resumption of 40 mg/day after that.  He denies any dizziness or syncope.  He has had no orthopnea or PND.  He denies any chest pain or pressure.   He continues to work in [employer].       Past Medical History:  Diagnosis Date   Afib ([place])     Anxiety     Aortic atherosclerosis ([place])     Cataract     ED (erectile dysfunction)     Edema     Gout     History of kidney stones     Hormone disorder     Hyperlipidemia     Hyperlipidemia     Lumbar disc disease     Nephrolithiasis     Patellofemoral arthritis             Past Surgical History:  Procedure Laterality Date   ACHILLES TENDON REPAIR Left     arthroscopic shoulder  Right     BROW LIFT Bilateral 04/08/2018    Procedure: BLEPHAROPLASTY BILATERAL LOWER LID;  Surgeon: Bea Graff, MD;  Location: [facility];  Service: Plastics;  Laterality: Bilateral;   INSERTION OF MESH   04/27/2021    Procedure: INSERTION OF MESH;  Surgeon: Herbert Pun, MD;  Location: [facility];  Service: General;;   RHYTIDECTOMY N/A 04/08/2018    Procedure: Malon Kindle;  Surgeon: Bea Graff, MD;  Location: [facility];  Service: Plastics;  Laterality: N/A;           Family History  Problem Relation Age of Onset   Colon cancer Mother     Alzheimer's disease  Father     CVA Father     Diabetes Brother        Social History         Socioeconomic History   Marital status: Single      Spouse name: Not on file   Number of children: Not on file   Years of education: Not on file   Highest education level: Not on file  Occupational History   Not on file  Tobacco Use   Smoking status: Never   Smokeless tobacco: Never  Vaping Use   Vaping Use: Never used  Substance and Sexual Activity   Alcohol use: No   Drug use: No   Sexual activity: Never  Other Topics Concern   Not on file  Social History Narrative   Not on file    Social Determinants of Health    Financial Resource Strain: Not on file  Food Insecurity: Not on file  Transportation Needs: Not on file  Physical Activity: Not on file  Stress: Not on file  Social Connections: Not on file  Intimate Partner Violence: Not on file             Prior to  Admission medications   Medication Sig Start Date End Date Taking? Authorizing Provider  apixaban (ELIQUIS) 5 MG TABS tablet Take 1 tablet (5 mg total) by mouth 2 (two) times daily. 07/07/21   Yes Monge, Helane Gunther, NP  buprenorphine-naloxone (SUBOXONE) 8-2 mg SUBL SL tablet Place 2.5 tablets under the tongue as needed.     Yes [provider]  clonazePAM (KLONOPIN) 0.5 MG tablet Take 0.5 mg by mouth 2 (two) times daily as needed for anxiety. 08/11/16   Yes [provider]  COLLAGEN PO Take 2 Scoops by mouth 3 (three) times daily.     Yes [provider]  Digestive Enzyme CAPS Take 3 capsules by mouth 3 (three) times daily.     Yes [provider]  furosemide (LASIX) 40 MG tablet Take 1.5 tablets (60 mg total) by mouth daily. 07/07/21 10/05/21 Yes Monge, Helane Gunther, NP  hydrocortisone 2.5 % cream Apply 1 application topically daily as needed for rash. 04/15/21   Yes [provider]  Multiple Vitamin (MULTIVITAMIN WITH MINERALS) TABS tablet Take 3 tablets by mouth daily. Total minerals     Yes [provider]  OVER THE COUNTER MEDICATION Take 3 capsules by mouth daily. Phase 2 otc supplement. Per patient this is a mineral     Yes [provider]  Probiotic Product (PROBIOTIC PO) Take 3 capsules by mouth 3 (three) times daily. total flora     Yes [provider]  VITAMIN D-VITAMIN K PO Take 1 capsule by mouth daily.     Yes [provider]            Current Outpatient Medications  Medication Sig Dispense Refill   apixaban (ELIQUIS) 5 MG TABS tablet Take 1 tablet (5 mg total) by mouth 2 (two) times daily. 60 tablet 11   buprenorphine-naloxone (SUBOXONE) 8-2 mg SUBL SL tablet Place 2.5 tablets under the tongue as needed.       clonazePAM (KLONOPIN) 0.5 MG tablet Take 0.5 mg by mouth 2 (two) times daily as needed for anxiety.       COLLAGEN PO Take 2 Scoops by mouth 3 (three) times daily.       Digestive Enzyme CAPS Take 3  capsules by mouth 3 (three) times daily.  furosemide (LASIX) 40 MG tablet Take 1.5 tablets (60 mg total) by mouth daily. 135 tablet 3   hydrocortisone 2.5 % cream Apply 1 application topically daily as needed for rash.       Multiple Vitamin (MULTIVITAMIN WITH MINERALS) TABS tablet Take 3 tablets by mouth daily. Total minerals       OVER THE COUNTER MEDICATION Take 3 capsules by mouth daily. Phase 2 otc supplement. Per patient this is a mineral       Probiotic Product (PROBIOTIC PO) Take 3 capsules by mouth 3 (three) times daily. total flora       VITAMIN D-VITAMIN K PO Take 1 capsule by mouth daily.        No current facility-administered medications for this visit.      No Known Allergies       Review of Systems:               General:                      normal appetite, + decreased energy, no weight gain, + weight loss, no fever             Cardiac:                       no chest pain with exertion, no chest pain at rest, +SOB with moderate exertion, no resting SOB, no PND, no orthopnea, no palpitations, + arrhythmia, + atrial fibrillation, + LE edema, no dizzy spells, no syncope             Respiratory:                 + exertional shortness of breath, no home oxygen, no productive cough, no dry cough, no bronchitis, no wheezing, no hemoptysis, no asthma, no pain with inspiration or cough, no sleep apnea, no CPAP at night             GI:                               no difficulty swallowing, no reflux, no frequent heartburn, no hiatal hernia, no abdominal pain, no constipation, no diarrhea, no hematochezia, no hematemesis, no melena             GU:                              no dysuria,  no frequency, no urinary tract infection, no hematuria, no enlarged prostate, + kidney stones, no kidney disease             Vascular:                     no pain suggestive of claudication, + pain in feet, no leg cramps, no varicose veins, no DVT, no non-healing foot ulcer             Neuro:                          no stroke, no TIA's, no seizures, no headaches, no temporary blindness one eye,  no slurred speech, no peripheral neuropathy, no chronic pain, no instability of gait, no memory/cognitive dysfunction             Musculoskeletal:  no arthritis, mp joint swelling, mp myalgias, mp difficulty walking, normal mobility              Skin:                            no rash, no itching, no skin infections, no pressure sores or ulcerations             Psych:                         no anxiety, no depression, no nervousness, no unusual recent stress             Eyes:                           no blurry vision, no floaters, no recent vision changes, + wears glasses              ENT:                            no hearing loss, no loose or painful teeth, no dentures, last saw dentist every 6 months             Hematologic:               + easy bruising, no abnormal bleeding, no clotting disorder, no frequent epistaxis             Endocrine:                   no diabetes, does not check CBG's at home                            Physical Exam:               BP 120/73   Pulse 100   Resp 20   Ht '6\' 2"'$  (1.88 m)   Wt 204 lb (92.5 kg)   SpO2 96% Comment: RA  BMI 26.19 kg/m              General:                      Fit and well-appearing             HEENT:                       Unremarkable, NCAT, PERLA, EOMI             Neck:                           no JVD, left cervical bruit or transmitted murmur, no adenopathy              Chest:                          clear to auscultation, symmetrical breath sounds, no wheezes, no rhonchi              CV:                              RRR, 3/6 systolic murmur RSB, no diastolic  murmur             Abdomen:                    soft, non-tender, no masses              Extremities:                 warm, well-perfused, pulses not palpable at ankle, + lower extremity edema             Rectal/GU                   Deferred             Neuro:                          Grossly non-focal and symmetrical throughout             Skin:                            Clean and dry, no rashes, no breakdown   Diagnostic Tests:       ECHOCARDIOGRAM REPORT         Patient Name:   IVOR KISHI Date of Exam: 05/27/2021  Medical Rec #:  [id number]     Height:       74.0 in  Accession #:    [id number]    Weight:       210.0 lb  Date of Birth:  1941/03/12      BSA:          2.219 m  Patient Age:    25 years      BP:           114/62 mmHg  Patient Gender: M             HR:           81 bpm.  Exam Location:  [place] [facility]   Procedure: 2D Echo, Cardiac Doppler, Color Doppler and Strain Analysis   Indications:    I35 Aortic stenosis     History:        Patient has prior history of Echocardiogram examinations,  most                  recent 04/13/2020. Aortic Valve Disease, Arrythmias:Atrial                  Fibrillation, Signs/Symptoms:Edema, Shortness of Breath  and                  Murmur; Risk Factors:Dyslipidemia.     Sonographer:    Basilia Jumbo BS, RDCS  Referring Phys: [place]     1. Compared to previous study, AS is worse (mean gradient now 55 mmHg).   2. Left ventricular ejection fraction, by estimation, is 45 to 50%. The  left ventricle has mildly decreased function. The left ventricle  demonstrates global hypokinesis. The left ventricular internal cavity size  was mildly dilated. There is mild left  ventricular hypertrophy. Left ventricular diastolic function could not be  evaluated. The average left ventricular global longitudinal strain is  -13.8 %. The global longitudinal strain is abnormal.   3. Right ventricular systolic function is normal. The right ventricular  size is moderately enlarged. There is severely elevated pulmonary artery  systolic pressure.  4. Left atrial size was severely dilated.   5. Right atrial size was severely dilated.   6. A small pericardial effusion is present.   7. The  mitral valve is normal in structure. Trivial mitral valve  regurgitation. No evidence of mitral stenosis.   8. Tricuspid valve regurgitation is moderate.   9. The aortic valve is calcified. Aortic valve regurgitation is trivial.  Severe aortic valve stenosis.  10. Aortic dilatation noted. There is moderate dilatation of the ascending  aorta, measuring 46 mm.  11. The inferior vena cava is dilated in size with <50% respiratory  variability, suggesting right atrial pressure of 15 mmHg.   Comparison(s): 04/13/20 EF 45-50%. PA pressure 40mHg. Severe AS 427mg  mean PG, 682m peak PG.   FINDINGS   Left Ventricle: Left ventricular ejection fraction, by estimation, is 45  to 50%. The left ventricle has mildly decreased function. The left  ventricle demonstrates global hypokinesis. The average left ventricular  global longitudinal strain is -13.8 %.  The global longitudinal strain is abnormal. The left ventricular internal  cavity size was mildly dilated. There is mild left ventricular  hypertrophy. Left ventricular diastolic function could not be evaluated  due to atrial fibrillation. Left ventricular  diastolic function could not be evaluated.   Right Ventricle: The right ventricular size is moderately enlarged. Right  ventricular systolic function is normal. There is severely elevated  pulmonary artery systolic pressure. The tricuspid regurgitant velocity is  3.51 m/s, and with an assumed right  atrial pressure of 15 mmHg, the estimated right ventricular systolic  pressure is 64.25.3Hg.   Left Atrium: Left atrial size was severely dilated.   Right Atrium: Right atrial size was severely dilated.   Pericardium: A small pericardial effusion is present.   Mitral Valve: The mitral valve is normal in structure. Mild mitral annular  calcification. Trivial mitral valve regurgitation. No evidence of mitral  valve stenosis.   Tricuspid Valve: The tricuspid valve is normal in structure.  Tricuspid  valve regurgitation is moderate . No evidence of tricuspid stenosis.   Aortic Valve: The aortic valve is calcified. Aortic valve regurgitation is  trivial. Severe aortic stenosis is present. Aortic valve mean gradient  measures 55.0 mmHg. Aortic valve peak gradient measures 91.6 mmHg. Aortic  valve area, by VTI measures 1.14  cm.   Pulmonic Valve: The pulmonic valve was normal in structure. Pulmonic valve  regurgitation is trivial. No evidence of pulmonic stenosis.   Aorta: Aortic dilatation noted. There is moderate dilatation of the  ascending aorta, measuring 46 mm.   Venous: The inferior vena cava is dilated in size with less than 50%  respiratory variability, suggesting right atrial pressure of 15 mmHg.   IAS/Shunts: No atrial level shunt detected by color flow Doppler.   Additional Comments: Compared to previous study, AS is worse (mean  gradient now 55 mmHg).      LEFT VENTRICLE  PLAX 2D  LVIDd:         5.90 cm  LVIDs:         3.70 cm   2D Longitudinal Strain  LV PW:         1.30 cm   2D Strain GLS Avg:     -13.8 %  LV IVS:        0.90 cm  LVOT diam:     2.70 cm  LV SV:         133  LV SV Index:   60  LVOT Area:  5.73 cm      RIGHT VENTRICLE             IVC  RV Basal diam:  5.65 cm     IVC diam: 3.80 cm  RV Mid diam:    4.90 cm  RV S prime:     10.40 cm/s  TAPSE (M-mode): 1.8 cm  RVSP:           64.3 mmHg   LEFT ATRIUM              Index        RIGHT ATRIUM            Index  LA diam:        6.00 cm  2.70 cm/m   RA Pressure: 15.00 mmHg  LA Vol (A2C):   117.0 ml 52.73 ml/m  RA Area:     35.70 cm  LA Vol (A4C):   117.0 ml 52.73 ml/m  RA Volume:   136.00 ml  61.29 ml/m  LA Biplane Vol: 120.0 ml 54.08 ml/m   AORTIC VALVE  AV Area (Vmax):    1.25 cm  AV Area (Vmean):   1.18 cm  AV Area (VTI):     1.14 cm  AV Vmax:           478.50 cm/s  AV Vmean:          345.800 cm/s  AV VTI:            1.165 m  AV Peak Grad:      91.6 mmHg  AV Mean  Grad:      55.0 mmHg  LVOT Vmax:         104.40 cm/s  LVOT Vmean:        71.133 cm/s  LVOT VTI:          0.232 m  LVOT/AV VTI ratio: 0.20     AORTA  Ao Root diam: 3.70 cm  Ao Asc diam:  4.40 cm   TRICUSPID VALVE  TR Peak grad:   49.3 mmHg  TR Vmax:        351.00 cm/s  Estimated RAP:  15.00 mmHg  RVSP:           64.3 mmHg     SHUNTS  Systemic VTI:  0.23 m  Systemic Diam: 2.70 cm   Kirk Ruths MD  Electronically signed by Kirk Ruths MD  Signature Date/Time: 06/02/2021/4:24:05 PM       Physicians  Panel Physicians Referring Physician Case Authorizing Physician  Jettie Booze, MD (Primary)     Procedures  RIGHT/LEFT HEART CATH AND CORONARY ANGIOGRAPHY   Conclusion      Prox LAD to Mid LAD lesion is 25% stenosed.   Hemodynamic findings consistent with moderate pulmonary hypertension.   Aortic valve not crossed due to known severe aortic stenosis requiring valve replacement.   Nonobstructive, mild coronary artery disease.   Moderate pulmonary artery hypertension.   TAVR team contacted.  CT scans to be arranged.   Results conveyed to wife, Suanne Marker.         Recommendations  Antiplatelet/Anticoag Recommend to resume Apixaban, at currently prescribed dose and frequency on 02/04/2022. Concurrent antiplatelet therapy not recommended.  Discharge Date In the absence of any other complications or medical issues, we expect the patient to be ready for discharge from a cath perspective on 02/03/2022.   Indications  Nonrheumatic aortic valve stenosis [I35.0 (ICD-10-CM)]   Clinical Presentation  CHF/Shock Congestive heart failure  not present. No shock present.   Procedural Details  Technical Details The risks, benefits, and details of the procedure were explained to the patient.  The patient verbalized understanding and wanted to proceed.  Informed written consent was obtained.  PROCEDURE TECHNIQUE:  After Xylocaine anesthesia, a 5 French sheath was placed  in the right antecubital area and exchanged for a peripheral IV. A 5 French balloontipped Swan-Ganz catheter was advanced to the pulmonary artery under fluoroscopic guidance. Hemodynamic pressures were obtained. Oxygen saturations were obtained. After Xylocaine anesthesia, a 29F sheath was placed in the right radial artery with a single anterior needle wall stick using ultrasound guidance.  Of note, he is tall and he has long arms.  The JR4 catheter barely reached. Right coronary angiography was done using a Judkins R4 guide catheter. Left coronary angiography was done using a Judkins L4 guide catheter, 125 cm.    Left heart cath was not done due to known severe aortic stenosis.      Contrast: 55 cc   Estimated blood loss <50 mL.   During this procedure medications were administered to achieve and maintain moderate conscious sedation while the patient's heart rate, blood pressure, and oxygen saturation were continuously monitored and I was present face-to-face 100% of this time.   Medications (Filter: Administrations occurring from 0935 to 1044 on 02/03/22) fentaNYL (SUBLIMAZE) injection (mcg) Total dose:  25 mcg  Date/Time Rate/Dose/Volume Action   02/03/22 0951 25 mcg Given    midazolam (VERSED) injection (mg) Total dose:  1 mg  Date/Time Rate/Dose/Volume Action   02/03/22 0952 1 mg Given    lidocaine (PF) (XYLOCAINE) 1 % injection (mL) Total volume:  4 mL  Date/Time Rate/Dose/Volume Action   02/03/22 1014 2 mL Given   1015 2 mL Given    Radial Cocktail/Verapamil only (mL) Total volume:  20 mL  Date/Time Rate/Dose/Volume Action   02/03/22 1018 10 mL Given   1035 10 mL Given    heparin sodium (porcine) injection (Units) Total dose:  4,000 Units  Date/Time Rate/Dose/Volume Action   02/03/22 1024 4,000 Units Given    Heparin (Porcine) in NaCl 1000-0.9 UT/500ML-% SOLN (mL) Total volume:  1,000 mL  Date/Time Rate/Dose/Volume Action   02/03/22 1035 500 mL Given   1035 500  mL Given    iohexol (OMNIPAQUE) 350 MG/ML injection (mL) Total volume:  55 mL  Date/Time Rate/Dose/Volume Action   02/03/22 1040 55 mL Given    Sedation Time  Sedation Time Physician-1: 43 minutes 13 seconds Contrast  Medication Name Total Dose  iohexol (OMNIPAQUE) 350 MG/ML injection 55 mL   Radiation/Fluoro  Fluoro time: 4.6 (min) DAP: 18.3 (Gycm2) Cumulative Air Kerma: 188.4 (mGy) Complications  Complications documented before study signed (02/03/2022 16:60 AM)   No complications were associated with this study.  Documented by Bethann Punches, RN - 02/03/2022 10:39 AM     Coronary Findings  Diagnostic Dominance: Right Left Anterior Descending  Prox LAD to Mid LAD lesion is 25% stenosed.    Left Circumflex  The vessel exhibits minimal luminal irregularities.    Right Coronary Artery  The vessel exhibits minimal luminal irregularities.    Intervention   No interventions have been documented.   Right Heart  Right Heart Pressures Hemodynamic findings consistent with moderate pulmonary hypertension. Aortic saturation 90%, PA saturation 73%, PA pressure 65/21, mean PA pressure 40 mmHg, pulmonary capillary wedge pressure 19 mmHg, cardiac output 8.4 L/min, cardiac index 3.94.   Left Heart  Left Ventricle The  left ventricle is mildly dilated.  Aortic Valve The aortic valve is calcified.   Coronary Diagrams  Diagnostic Dominance: Right  Intervention  Implants     No implant documentation for this case.   Syngo Images   Show images for CARDIAC CATHETERIZATION Images on Long Term Storage   Show images for Nikash, Mortensen to Procedure Log  Procedure Log    Hemo Data  Flowsheet Row Most Recent Value  Fick Cardiac Output 8.4 L/min  Fick Cardiac Output Index 3.94 (L/min)/BSA  RA A Wave 15 mmHg  RA V Wave 14 mmHg  RA Mean 10 mmHg  RV Systolic Pressure 62 mmHg  RV Diastolic Pressure 1 mmHg  RV EDP 9 mmHg  PA Systolic Pressure 65 mmHg  PA  Diastolic Pressure 21 mmHg  PA Mean 40 mmHg  PW A Wave 25 mmHg  PW V Wave 31 mmHg  PW Mean 19 mmHg  AO Systolic Pressure 308 mmHg  AO Diastolic Pressure 48 mmHg  AO Mean 68 mmHg  QP/QS 1  TPVR Index 10.15 HRUI    ADDENDUM REPORT: 02/08/2022 12:40   FINDINGS: Extracardiac findings will be described separately under dictation for contemporaneously obtained CTA chest, abdomen and pelvis.   IMPRESSION: Please see separate dictation for contemporaneously obtained CTA chest, abdomen and pelvis dated 02/08/2022 for full description of relevant extracardiac findings.     Electronically Signed   By: Yetta Glassman M.D.   On: 02/08/2022 12:40    Addended by [id number], Dorris Singh, MD on 02/08/2022 12:58 PM   Study Result  Narrative & Impression  CLINICAL DATA:  Aortic Stenosis   EXAM: Cardiac TAVR CT   TECHNIQUE: The patient was scanned on a Siemens Force 528 slice scanner. A 120 kV retrospective scan was triggered in the ascending thoracic aorta at 140 HU's. Gantry rotation speed was 250 msecs and collimation was .6 mm. No beta blockade or nitro were given. The 3D data set was reconstructed in 5% intervals of the R-R cycle. Systolic and diastolic phases were analyzed on a dedicated work station using MPR, MIP and VRT modes. The patient received 80 cc of contrast.   FINDINGS: Aortic Valve: Heavily calcified tri leaflet AV score 5996   Aorta: Dilated ascending thoracic aorta. Normal arch vessels moderate calcific atherosclerosis   Sino-tubular Junction: 33 mm   Ascending Thoracic Aorta: Dilated 41 mm   Aortic Arch: 28 mm   Descending Thoracic Aorta: 29 mm   Sinus of Valsalva Measurements:   Non-coronary: 36.7 mm   Right - coronary: 35.6 mm   Left -   coronary: 33.9 mm   Coronary Artery Height above Annulus:   Left Main: 14.3 mm above annulus   Right Coronary: 14 mm above annulus   Virtual Basal Annulus Measurements:   Maximum / Minimum Diameter: 30.9 mm  x 22.8 mm   Perimeter: 90.7 mm   Area: 595 mm2   Coronary Arteries: Sufficient height above annulus for deployment   Optimum Fluoroscopic Angle for Delivery: LAO 14 Caudal 14   IMPRESSION: 1. Tri leaflet AV with calcium score 5996   2.  Annular area of 595 mm2 suitable for a 29 mm Sapien 3 valve   3.  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 Optimum angiographic angle for deployment LAO 14 Caudal 14 degrees   5.  Membranous septal length 12.3 mm   Jenkins Rouge   Electronically Signed: By: Jenkins Rouge M.D. On: 02/08/2022 12:32    Narrative &  Impression  CLINICAL DATA:  Preop evaluation for TAVR   EXAM: CTA ABDOMEN AND PELVIS WITHOUT AND WITH CONTRAST   TECHNIQUE: Multidetector CT imaging of the abdomen and pelvis was performed using the standard protocol during bolus administration of intravenous contrast. Multiplanar reconstructed images and MIPs were obtained and reviewed to evaluate the vascular anatomy.   RADIATION DOSE REDUCTION: This exam was performed according to the departmental dose-optimization program which includes automated exposure control, adjustment of the mA and/or kV according to patient size and/or use of iterative reconstruction technique.   CONTRAST:  150m OMNIPAQUE IOHEXOL 350 MG/ML SOLN   COMPARISON:  None Available.   CTA CHEST FINDINGS   Cardiovascular: Cardiomegaly. Trace pericardial recess fluid. Aortic valve thickening and calcifications. Normal caliber thoracic aorta with moderate calcified and noncalcified plaque.Left main and 3 vessel coronary artery disease.   Mediastinum/Nodes: Esophagus and thyroid are unremarkable. No pathologically enlarged lymph nodes seen in the chest.   Lungs/Pleura: Central airways are patent. Moderate right and trace left pleural effusions with bibasilar atelectasis. No evidence of pneumothorax.   Musculoskeletal: No chest wall abnormality. No acute or significant osseous findings.    CTA ABDOMEN AND PELVIS FINDINGS   Hepatobiliary: No focal liver abnormality is seen. No gallstones, gallbladder wall thickening, or biliary dilatation.   Pancreas: Unremarkable. No pancreatic ductal dilatation or surrounding inflammatory changes.   Spleen: Normal in size without focal abnormality.   Adrenals/Urinary Tract: Bilateral adrenal glands are unremarkable. Bilateral simple renal cysts, no further follow-up imaging is recommended. Bladder is unremarkable.   Stomach/Bowel: Stomach is within normal limits. Appendix appears normal. Severe diverticulosis of the sigmoid colon. No evidence of bowel wall thickening, distention, or inflammatory changes.   Vascular/lymphatic: Normal caliber abdominal aorta with severe predominantly calcified plaque. Focal linear filling defect of the infrarenal abdominal aorta seen on series 3 image 378. Dilation and asymmetric opacification of the right iliac veins and numerous small adjacent collateral vessels. External iliac vein measures up to 3.7 cm. Major aortic branch vessels are patent. No pathologically enlarged lymph nodes seen in the chest.   Reproductive: Unremarkable.   Other: Small left-greater-than-right fat containing inguinal hernias. Diffuse soft tissue anasarca. Trace abdominopelvic ascites.   Musculoskeletal: No acute or significant osseous findings.   VASCULAR MEASUREMENTS PERTINENT TO TAVR:   AORTA:   Minimal Aortic Diameter -   mm   Severity of Aortic Calcification-severe   RIGHT PELVIS:   Right Common Iliac Artery -   Minimal Diameter-9.4 mm   Tortuosity-severe   Calcification-severe   Right External Iliac Artery -   Minimal Diameter-10.1 mm   Tortuosity-severe   Calcification-moderate   Right Common Femoral Artery -   Minimal Diameter-9.6 mm   Tortuosity-none   Calcification-mild   LEFT PELVIS:   Left Common Iliac Artery -   Minimal Diameter-11.6 mm   Tortuosity-severe    Calcification-severe   Left External Iliac Artery -   Minimal Diameter-10.1 mm   Tortuosity-severe   Calcification-moderate   Left Common Femoral Artery -   Minimal Diameter-10.7 mm   Tortuosity-none   Calcification-moderate   Review of the MIP images confirms the above findings.   IMPRESSION: VASCULAR   1. Vascular findings and measurements pertinent to potential TAVR procedure, as detailed above. 2. Thickening and calcification of the aortic valve, compatible with reported clinical history of aortic stenosis. 3. 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 4. 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 MRI/MRA assist with further characterization. 5. Focal linear filling defect of the infrarenal abdominal aorta, differential considerations include small focal dissection or linear intraluminal plaque.   NON-VASCULAR   1. Moderate right and small left pleural effusions with atelectasis. 2. Trace abdominopelvic ascites and diffuse soft tissue anasarca.     Electronically Signed   By: Yetta Glassman M.D.   On: 02/08/2022 12:39   Impression:   This 81 year old gentleman has stage D, severe, symptomatic aortic stenosis with New York Heart Association class II symptoms of exertional fatigue and shortness of breath as well as significant lower extremity edema consistent with chronic diastolic congestive heart failure.  I have personally reviewed his 2D echocardiogram, cardiac catheterization, and CTA studies.  His 2D echo shows a severely calcified aortic valve with restricted mobility with a mean gradient of 55 mmHg consistent with critical aortic stenosis.  Left ventricular ejection fraction is mildly reduced to 45 to 50% and was 55% in January 2021.  His left ventricular  internal dimension in diastole is 5.9 cm.  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  There is moderate pulmonary hypertension with a PA pressure of 65/21 and a mean of 40.  Mean right atrial pressure was 10.  I think that it is time to proceed with aortic valve replacement for relief of his symptoms and to prevent progressive left ventricular deterioration.  Given his age of 29 I think transcatheter aortic valve replacement would be the best option for him.  His gated cardiac CTA shows a heavily calcified trileaflet aortic valve with anatomy suitable for TAVR using a 29 mm SAPIEN 3 valve.  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  Abdominal and pelvic CTA shows adequate pelvic vascular anatomy to allow transfemoral insertion.  The patient was counseled at length regarding treatment alternatives for management of severe symptomatic aortic stenosis. The risks and benefits of surgical intervention has been discussed in detail. Long-term prognosis with medical therapy was discussed. Alternative approaches such as conventional surgical aortic valve replacement, transcatheter aortic valve replacement, and palliative medical therapy were compared and contrasted at length. This discussion was placed in the context of the patient's own specific clinical presentation and past medical history. All of his questions have been addressed.   Following the decision to proceed with transcatheter aortic valve replacement, a discussion was held regarding what types of management strategies would be attempted intraoperatively in the event of life-threatening complications, including whether or not the patient would be considered a candidate for the use of cardiopulmonary bypass and/or conversion to open sternotomy for attempted surgical intervention.  I think he would be a candidate for emergent sternotomy to manage any intraoperative complications.  The patient is aware  of the fact that transient use of cardiopulmonary bypass may be necessary. The patient has been advised of a variety of complications that might develop including but not limited to risks of death, stroke, paravalvular leak, aortic dissection or other major vascular complications, aortic annulus rupture, device embolization, cardiac rupture or perforation, mitral regurgitation, acute myocardial infarction, arrhythmia, heart block or bradycardia requiring permanent pacemaker placement, congestive heart failure, respiratory failure, renal failure, pneumonia, infection, other late complications related to structural valve deterioration or migration, or other complications that might ultimately cause a temporary or permanent loss of functional independence or other long term morbidity. The patient provides full informed consent for  the procedure as described and all questions were answered.     Plan:   Transfemoral TAVR using a SAPIEN 3 valve.     Gaye Pollack, MD

## 2022-02-28 ENCOUNTER — Inpatient Hospital Stay (HOSPITAL_COMMUNITY): Payer: Medicare Other

## 2022-02-28 ENCOUNTER — Other Ambulatory Visit (HOSPITAL_COMMUNITY): Payer: Self-pay | Admitting: *Deleted

## 2022-02-28 ENCOUNTER — Other Ambulatory Visit: Payer: Self-pay

## 2022-02-28 ENCOUNTER — Other Ambulatory Visit: Payer: Self-pay | Admitting: Physician Assistant

## 2022-02-28 ENCOUNTER — Encounter (HOSPITAL_COMMUNITY)
Admission: RE | Disposition: A | Discharge status: Home / Self Care | Payer: Self-pay | Source: Ambulatory Visit | Attending: Thoracic Surgery (Cardiothoracic Vascular Surgery)

## 2022-02-28 ENCOUNTER — Ambulatory Visit (HOSPITAL_COMMUNITY): Payer: Medicare Other

## 2022-02-28 ENCOUNTER — Encounter (HOSPITAL_COMMUNITY): Payer: Self-pay | Admitting: Cardiovascular Disease

## 2022-02-28 ENCOUNTER — Inpatient Hospital Stay (HOSPITAL_COMMUNITY): Payer: Medicare Other | Admitting: Physician Assistant

## 2022-02-28 ENCOUNTER — Inpatient Hospital Stay (HOSPITAL_COMMUNITY)
Admission: RE | Admit: 2022-02-28 | Discharge: 2022-03-02 | DRG: 267 | Disposition: A | Discharge status: Home / Self Care | Payer: Medicare Other | Source: Ambulatory Visit | Attending: Thoracic Surgery (Cardiothoracic Vascular Surgery) | Admitting: Thoracic Surgery (Cardiothoracic Vascular Surgery)

## 2022-02-28 DIAGNOSIS — I5042 Chronic combined systolic (congestive) and diastolic (congestive) heart failure: Secondary | ICD-10-CM | POA: Diagnosis present

## 2022-02-28 DIAGNOSIS — I2510 Atherosclerotic heart disease of native coronary artery without angina pectoris: Secondary | ICD-10-CM | POA: Diagnosis present

## 2022-02-28 DIAGNOSIS — I350 Nonrheumatic aortic (valve) stenosis: Principal | ICD-10-CM | POA: Diagnosis present

## 2022-02-28 DIAGNOSIS — I4821 Permanent atrial fibrillation: Secondary | ICD-10-CM | POA: Diagnosis present

## 2022-02-28 DIAGNOSIS — I110 Hypertensive heart disease with heart failure: Secondary | ICD-10-CM | POA: Diagnosis present

## 2022-02-28 DIAGNOSIS — Z800 Family history of malignant neoplasm of digestive organs: Secondary | ICD-10-CM

## 2022-02-28 DIAGNOSIS — Z79899 Other long term (current) drug therapy: Secondary | ICD-10-CM

## 2022-02-28 DIAGNOSIS — I10 Essential (primary) hypertension: Secondary | ICD-10-CM

## 2022-02-28 DIAGNOSIS — I4891 Unspecified atrial fibrillation: Secondary | ICD-10-CM

## 2022-02-28 DIAGNOSIS — Z823 Family history of stroke: Secondary | ICD-10-CM | POA: Diagnosis not present

## 2022-02-28 DIAGNOSIS — Z833 Family history of diabetes mellitus: Secondary | ICD-10-CM

## 2022-02-28 DIAGNOSIS — I482 Chronic atrial fibrillation, unspecified: Secondary | ICD-10-CM

## 2022-02-28 DIAGNOSIS — E782 Mixed hyperlipidemia: Secondary | ICD-10-CM | POA: Diagnosis present

## 2022-02-28 DIAGNOSIS — Z952 Presence of prosthetic heart valve: Secondary | ICD-10-CM

## 2022-02-28 DIAGNOSIS — E876 Hypokalemia: Secondary | ICD-10-CM | POA: Diagnosis present

## 2022-02-28 DIAGNOSIS — M1990 Unspecified osteoarthritis, unspecified site: Secondary | ICD-10-CM

## 2022-02-28 DIAGNOSIS — I2721 Secondary pulmonary arterial hypertension: Secondary | ICD-10-CM | POA: Diagnosis present

## 2022-02-28 DIAGNOSIS — F419 Anxiety disorder, unspecified: Secondary | ICD-10-CM | POA: Diagnosis not present

## 2022-02-28 DIAGNOSIS — Z006 Encounter for examination for normal comparison and control in clinical research program: Secondary | ICD-10-CM

## 2022-02-28 DIAGNOSIS — Z7901 Long term (current) use of anticoagulants: Secondary | ICD-10-CM

## 2022-02-28 HISTORY — PX: INTRAOPERATIVE TRANSTHORACIC ECHOCARDIOGRAM: SHX6523

## 2022-02-28 HISTORY — PX: TRANSCATHETER AORTIC VALVE REPLACEMENT, TRANSFEMORAL: SHX6400

## 2022-02-28 HISTORY — DX: Presence of prosthetic heart valve: Z95.2

## 2022-02-28 HISTORY — DX: Essential (primary) hypertension: I10

## 2022-02-28 HISTORY — DX: Nonrheumatic aortic (valve) stenosis: I35.0

## 2022-02-28 HISTORY — DX: Chronic combined systolic (congestive) and diastolic (congestive) heart failure: I50.42

## 2022-02-28 LAB — POCT I-STAT, CHEM 8
BUN: 21 mg/dL (ref 8–23)
BUN: 23 mg/dL (ref 8–23)
Calcium, Ion: 1.12 mmol/L — ABNORMAL LOW (ref 1.15–1.40)
Calcium, Ion: 1.12 mmol/L — ABNORMAL LOW (ref 1.15–1.40)
Chloride: 97 mmol/L — ABNORMAL LOW (ref 98–111)
Chloride: 99 mmol/L (ref 98–111)
Creatinine, Ser: 0.9 mg/dL (ref 0.61–1.24)
Creatinine, Ser: 0.8 mg/dL (ref 0.61–1.24)
Glucose, Bld: 100 mg/dL — ABNORMAL HIGH (ref 70–99)
Glucose, Bld: 107 mg/dL — ABNORMAL HIGH (ref 70–99)
HCT: 38 % — ABNORMAL LOW (ref 39.0–52.0)
HCT: 39 % (ref 39.0–52.0)
Hemoglobin: 12.9 g/dL — ABNORMAL LOW (ref 13.0–17.0)
Hemoglobin: 13.3 g/dL (ref 13.0–17.0)
Potassium: 3.2 mmol/L — ABNORMAL LOW (ref 3.5–5.1)
Potassium: 3.4 mmol/L — ABNORMAL LOW (ref 3.5–5.1)
Sodium: 140 mmol/L (ref 135–145)
Sodium: 142 mmol/L (ref 135–145)
TCO2: 30 mmol/L (ref 22–32)
TCO2: 32 mmol/L (ref 22–32)

## 2022-02-28 LAB — ECHOCARDIOGRAM LIMITED
AR max vel: 3.04 cm2
AV Area VTI: 2.93 cm2
AV Area mean vel: 2.95 cm2
AV Mean grad: 7 mmHg
AV Peak grad: 13.2 mmHg
Ao pk vel: 1.82 m/s
Calc EF: 53.4 %
S' Lateral: 3.63 cm
Single Plane A2C EF: 56.7 %
Single Plane A4C EF: 51.1 %

## 2022-02-28 LAB — MRSA NEXT GEN BY PCR, NASAL: MRSA by PCR Next Gen: NOT DETECTED

## 2022-02-28 LAB — ABO/RH: ABO/RH(D): O POS

## 2022-02-28 SURGERY — IMPLANTATION, AORTIC VALVE, TRANSCATHETER, FEMORAL APPROACH
Anesthesia: Monitor Anesthesia Care | Site: Chest

## 2022-02-28 MED ORDER — SODIUM CHLORIDE 0.9% FLUSH
3.0000 mL | Freq: Two times a day (BID) | INTRAVENOUS | Status: DC
  Administered 2022-02-28 – 2022-03-01 (×2): 3 mL via INTRAVENOUS

## 2022-02-28 MED ORDER — LIDOCAINE HCL 1 % IJ SOLN
INTRAMUSCULAR | Status: DC | PRN
  Administered 2022-02-28: 20 mL via INTRADERMAL

## 2022-02-28 MED ORDER — CHLORHEXIDINE GLUCONATE 4 % EX LIQD: 60.0000 mL | Freq: Once | CUTANEOUS | Status: DC

## 2022-02-28 MED ORDER — ACETAMINOPHEN 325 MG PO TABS
650.0000 mg | ORAL_TABLET | Freq: Four times a day (QID) | ORAL | Status: DC | PRN
  Administered 2022-03-01 – 2022-03-02 (×3): 650 mg via ORAL
  Filled 2022-02-28 (×3): qty 2

## 2022-02-28 MED ORDER — HEPARIN 6000 UNIT IRRIGATION SOLUTION
Status: DC | PRN
  Administered 2022-02-28 (×3): 1

## 2022-02-28 MED ORDER — OXYCODONE HCL 5 MG PO TABS: 5.0000 mg | ORAL_TABLET | ORAL | Status: DC | PRN

## 2022-02-28 MED ORDER — NITROGLYCERIN IN D5W 200-5 MCG/ML-% IV SOLN: 0.0000 ug/min | INTRAVENOUS | Status: DC

## 2022-02-28 MED ORDER — FENTANYL CITRATE (PF) 250 MCG/5ML IJ SOLN
INTRAMUSCULAR | Status: AC
  Filled 2022-02-28: qty 5

## 2022-02-28 MED ORDER — CHLORHEXIDINE GLUCONATE 4 % EX LIQD: 30.0000 mL | CUTANEOUS | Status: DC

## 2022-02-28 MED ORDER — LACTATED RINGERS IV SOLN: INTRAVENOUS | Status: DC | PRN

## 2022-02-28 MED ORDER — MORPHINE SULFATE (PF) 2 MG/ML IV SOLN: 1.0000 mg | INTRAVENOUS | Status: DC | PRN

## 2022-02-28 MED ORDER — 0.9 % SODIUM CHLORIDE (POUR BTL) OPTIME
TOPICAL | Status: DC | PRN
  Administered 2022-02-28: 1000 mL

## 2022-02-28 MED ORDER — SODIUM CHLORIDE 0.9 % IV SOLN: INTRAVENOUS | Status: AC

## 2022-02-28 MED ORDER — CHLORHEXIDINE GLUCONATE CLOTH 2 % EX PADS
6.0000 | MEDICATED_PAD | Freq: Every day | CUTANEOUS | Status: DC
  Administered 2022-02-28: 6 via TOPICAL

## 2022-02-28 MED ORDER — HEPARIN 6000 UNIT IRRIGATION SOLUTION
Status: AC
  Filled 2022-02-28: qty 1500

## 2022-02-28 MED ORDER — ACETAMINOPHEN 650 MG RE SUPP: 650.0000 mg | Freq: Four times a day (QID) | RECTAL | Status: DC | PRN

## 2022-02-28 MED ORDER — PROTAMINE SULFATE 10 MG/ML IV SOLN
INTRAVENOUS | Status: AC
  Filled 2022-02-28: qty 50

## 2022-02-28 MED ORDER — CEFAZOLIN SODIUM-DEXTROSE 2-4 GM/100ML-% IV SOLN
2.0000 g | Freq: Three times a day (TID) | INTRAVENOUS | Status: AC
  Administered 2022-02-28 – 2022-03-01 (×2): 2 g via INTRAVENOUS
  Filled 2022-02-28 (×2): qty 100

## 2022-02-28 MED ORDER — PROPOFOL 10 MG/ML IV BOLUS
INTRAVENOUS | Status: DC | PRN
  Administered 2022-02-28: 30 mg via INTRAVENOUS
  Administered 2022-02-28: 20 mg via INTRAVENOUS

## 2022-02-28 MED ORDER — APIXABAN 5 MG PO TABS
5.0000 mg | ORAL_TABLET | Freq: Two times a day (BID) | ORAL | Status: DC
  Administered 2022-03-01 – 2022-03-02 (×3): 5 mg via ORAL
  Filled 2022-02-28 (×3): qty 1

## 2022-02-28 MED ORDER — FENTANYL CITRATE (PF) 250 MCG/5ML IJ SOLN
INTRAMUSCULAR | Status: DC | PRN
  Administered 2022-02-28: 100 ug via INTRAVENOUS

## 2022-02-28 MED ORDER — HEPARIN SODIUM (PORCINE) 1000 UNIT/ML IJ SOLN
INTRAMUSCULAR | Status: DC | PRN
  Administered 2022-02-28: 13000 [IU] via INTRAVENOUS
  Administered 2022-02-28: 2000 [IU] via INTRAVENOUS

## 2022-02-28 MED ORDER — IODIXANOL 320 MG/ML IV SOLN
INTRAVENOUS | Status: DC | PRN
  Administered 2022-02-28: 100 mL

## 2022-02-28 MED ORDER — SODIUM CHLORIDE 0.9% FLUSH: 3.0000 mL | INTRAVENOUS | Status: DC | PRN

## 2022-02-28 MED ORDER — SODIUM CHLORIDE 0.9 % IV SOLN: 250.0000 mL | INTRAVENOUS | Status: DC | PRN

## 2022-02-28 MED ORDER — LIDOCAINE 2% (20 MG/ML) 5 ML SYRINGE
INTRAMUSCULAR | Status: DC | PRN
  Administered 2022-02-28: 100 mg via INTRAVENOUS

## 2022-02-28 MED ORDER — PROPOFOL 10 MG/ML IV BOLUS
INTRAVENOUS | Status: AC
  Filled 2022-02-28: qty 20

## 2022-02-28 MED ORDER — BUPRENORPHINE HCL-NALOXONE HCL 8-2 MG SL SUBL
1.5000 | SUBLINGUAL_TABLET | Freq: Every day | SUBLINGUAL | Status: DC
  Administered 2022-02-28 – 2022-03-01 (×2): 1.5 via SUBLINGUAL
  Filled 2022-02-28: qty 1.5
  Filled 2022-02-28 (×2): qty 2

## 2022-02-28 MED ORDER — SODIUM CHLORIDE 0.9 % IV SOLN: INTRAVENOUS | Status: DC

## 2022-02-28 MED ORDER — MIDAZOLAM HCL 2 MG/2ML IJ SOLN
INTRAMUSCULAR | Status: DC | PRN
  Administered 2022-02-28: 2 mg via INTRAVENOUS

## 2022-02-28 MED ORDER — PROPOFOL 500 MG/50ML IV EMUL
INTRAVENOUS | Status: DC | PRN
  Administered 2022-02-28: 20 ug/kg/min via INTRAVENOUS

## 2022-02-28 MED ORDER — ONDANSETRON HCL 4 MG/2ML IJ SOLN: 4.0000 mg | Freq: Four times a day (QID) | INTRAMUSCULAR | Status: DC | PRN

## 2022-02-28 MED ORDER — TRAMADOL HCL 50 MG PO TABS: 50.0000 mg | ORAL_TABLET | ORAL | Status: DC | PRN

## 2022-02-28 MED ORDER — CHLORHEXIDINE GLUCONATE 0.12 % MT SOLN
15.0000 mL | Freq: Once | OROMUCOSAL | Status: DC
  Filled 2022-02-28: qty 15

## 2022-02-28 MED ORDER — PROTAMINE SULFATE 10 MG/ML IV SOLN
INTRAVENOUS | Status: DC | PRN
  Administered 2022-02-28: 130 mg via INTRAVENOUS

## 2022-02-28 MED ORDER — LIDOCAINE HCL 1 % IJ SOLN
INTRAMUSCULAR | Status: AC
  Filled 2022-02-28: qty 20

## 2022-02-28 MED ORDER — CLEVIDIPINE BUTYRATE 0.5 MG/ML IV EMUL
INTRAVENOUS | Status: AC
  Filled 2022-02-28: qty 50

## 2022-02-28 MED ORDER — SODIUM CHLORIDE 0.9 % IV SOLN: 250.0000 mL | INTRAVENOUS | Status: DC

## 2022-02-28 MED ORDER — CLONAZEPAM 0.5 MG PO TABS
0.5000 mg | ORAL_TABLET | Freq: Two times a day (BID) | ORAL | Status: DC
  Administered 2022-02-28 – 2022-03-02 (×3): 0.5 mg via ORAL
  Filled 2022-02-28 (×4): qty 1

## 2022-02-28 MED ORDER — MIDAZOLAM HCL 2 MG/2ML IJ SOLN
INTRAMUSCULAR | Status: AC
  Filled 2022-02-28: qty 2

## 2022-02-28 MED ORDER — ONDANSETRON HCL 4 MG/2ML IJ SOLN
INTRAMUSCULAR | Status: DC | PRN
  Administered 2022-02-28: 4 mg via INTRAVENOUS

## 2022-02-28 SURGICAL SUPPLY — 63 items
BAG COUNTER SPONGE SURGICOUNT (BAG) ×2 IMPLANT
BAG DECANTER FOR FLEXI CONT (MISCELLANEOUS) IMPLANT
BALLN TRUE 18X4.5 (BALLOONS) ×2
BALLOON TRUE 18X4.5 (BALLOONS) IMPLANT
BLADE CLIPPER SURG (BLADE) IMPLANT
BLADE OSCILLATING /SAGITTAL (BLADE) IMPLANT
BLADE STERNUM SYSTEM 6 (BLADE) IMPLANT
CABLE ADAPT CONN TEMP 6FT (ADAPTER) ×2 IMPLANT
CATH DIAG EXPO 6F AL1 (CATHETERS) IMPLANT
CATH DIAG EXPO 6F VENT PIG 145 (CATHETERS) ×4 IMPLANT
CATH INFINITI 5FR ANG PIGTAIL (CATHETERS) IMPLANT
CATH INFINITI 6F AL2 (CATHETERS) IMPLANT
CATH S G BIP PACING (CATHETERS) ×2 IMPLANT
CHLORAPREP W/TINT 26 (MISCELLANEOUS) ×2 IMPLANT
CLOSURE MYNX CONTROL 6F/7F (Vascular Products) IMPLANT
CNTNR URN SCR LID CUP LEK RST (MISCELLANEOUS) ×4 IMPLANT
CONT SPEC 4OZ STRL OR WHT (MISCELLANEOUS) ×4
COVER BACK TABLE 80X110 HD (DRAPES) ×2 IMPLANT
DERMABOND ADVANCED .7 DNX12 (GAUZE/BANDAGES/DRESSINGS) ×2 IMPLANT
DEVICE CLOSURE PERCLS PRGLD 6F (VASCULAR PRODUCTS) ×4 IMPLANT
DRAPE SURG 17X23 STRL (DRAPES) IMPLANT
DRSG TEGADERM 4X4.75 (GAUZE/BANDAGES/DRESSINGS) ×4 IMPLANT
ELECT REM PT RETURN 9FT ADLT (ELECTROSURGICAL) ×2
ELECTRODE REM PT RTRN 9FT ADLT (ELECTROSURGICAL) ×2 IMPLANT
GAUZE 4X4 16PLY ~~LOC~~+RFID DBL (SPONGE) IMPLANT
GAUZE SPONGE 4X4 12PLY STRL (GAUZE/BANDAGES/DRESSINGS) ×2 IMPLANT
GLOVE BIO SURGEON STRL SZ7.5 (GLOVE) ×2 IMPLANT
GLOVE ECLIPSE 7.0 STRL STRAW (GLOVE) ×2 IMPLANT
GLOVE ECLIPSE 7.5 STRL STRAW (GLOVE) IMPLANT
GOWN STRL REUS W/ TWL LRG LVL3 (GOWN DISPOSABLE) IMPLANT
GOWN STRL REUS W/ TWL XL LVL3 (GOWN DISPOSABLE) ×2 IMPLANT
GOWN STRL REUS W/TWL LRG LVL3 (GOWN DISPOSABLE) ×4
GOWN STRL REUS W/TWL XL LVL3 (GOWN DISPOSABLE) ×6
GUIDEWIRE MEIER 185 .035 J-TIP (WIRE) IMPLANT
KIT BASIN OR (CUSTOM PROCEDURE TRAY) ×2 IMPLANT
KIT HEART LEFT (KITS) ×2 IMPLANT
KIT SAPIAN 3 ULTRA RESILIA 29 (Valve) IMPLANT
KIT TURNOVER KIT B (KITS) ×2 IMPLANT
NS IRRIG 1000ML POUR BTL (IV SOLUTION) ×2 IMPLANT
PACK ENDO MINOR (CUSTOM PROCEDURE TRAY) ×2 IMPLANT
PAD ARMBOARD 7.5X6 YLW CONV (MISCELLANEOUS) ×4 IMPLANT
PAD ELECT DEFIB RADIOL ZOLL (MISCELLANEOUS) ×2 IMPLANT
PERCLOSE PROGLIDE 6F (VASCULAR PRODUCTS) ×4
POSITIONER HEAD DONUT 9IN (MISCELLANEOUS) ×2 IMPLANT
SET MICROPUNCTURE 5F STIFF (MISCELLANEOUS) ×2 IMPLANT
SHEATH BRITE TIP 7FR 35CM (SHEATH) ×2 IMPLANT
SHEATH PINNACLE 6F 10CM (SHEATH) ×2 IMPLANT
SHEATH PINNACLE 8F 10CM (SHEATH) ×2 IMPLANT
SLEEVE REPOSITIONING LENGTH 30 (MISCELLANEOUS) ×2 IMPLANT
SPIKE FLUID TRANSFER (MISCELLANEOUS) ×2 IMPLANT
SPONGE T-LAP 18X18 ~~LOC~~+RFID (SPONGE) ×2 IMPLANT
STOPCOCK MORSE 400PSI 3WAY (MISCELLANEOUS) ×4 IMPLANT
SUT PROLENE 6 0 C 1 30 (SUTURE) IMPLANT
SUT SILK  1 MH (SUTURE) ×2
SUT SILK 1 MH (SUTURE) ×2 IMPLANT
SYR 30ML LL (SYRINGE) IMPLANT
SYR 50ML LL SCALE MARK (SYRINGE) ×2 IMPLANT
SYR BULB IRRIG 60ML STRL (SYRINGE) IMPLANT
TOWEL GREEN STERILE (TOWEL DISPOSABLE) ×4 IMPLANT
TRANSDUCER W/STOPCOCK (MISCELLANEOUS) ×4 IMPLANT
WIRE EMERALD 3MM-J .035X150CM (WIRE) ×2 IMPLANT
WIRE EMERALD 3MM-J .035X260CM (WIRE) ×2 IMPLANT
WIRE EMERALD ST .035X260CM (WIRE) ×4 IMPLANT

## 2022-02-28 NOTE — Progress Notes (Signed)
Consult received. Noted that vasopressor was used and discontinued after procedure. Fran Lowes, RN VAST

## 2022-02-28 NOTE — Progress Notes (Signed)
Patient unable to urinate and very uncomfortable.  Kathyrn Drown NP notified and verbal order given to in and out cath.  400 mL urine out, patient resting comfortably.

## 2022-02-28 NOTE — Op Note (Deleted)
[facility]   MULTIDISCIPLINARY HEART VALVE TEAM   TAVR OPERATIVE NOTE   Date of Procedure:  02/28/2022  Preoperative Diagnosis: Severe Aortic Stenosis   Postoperative Diagnosis: Same   Procedure:   Transcatheter Aortic Valve Replacement - Percutaneous Right Transfemoral Approach  Edwards Sapien 3 Ultra THV (size 23 mm, model # 9755RSL, serial # [id number])   Co-Surgeons:  Coralie Common MD and   Lauree Chandler   Anesthesiologist:  Carmelina Noun MD  Pre-operative Echo Findings: Severe aortic stenosis Normal left ventricular systolic function  Post-operative Echo Findings: No paravalvular leak Normal left ventricular systolic function   BRIEF CLINICAL NOTE AND INDICATIONS FOR SURGERY  This 81 year old gentleman has stage D, severe, symptomatic aortic stenosis with New York Heart Association class II symptoms of exertional fatigue and shortness of breath as well as significant lower extremity edema consistent with chronic diastolic congestive heart failure.  I have personally reviewed his 2D echocardiogram, cardiac catheterization, and CTA studies.  His 2D echo shows a severely calcified aortic valve with restricted mobility with a mean gradient of 55 mmHg consistent with critical aortic stenosis.  Left ventricular ejection fraction is mildly reduced to 45 to 50% and was 55% in January 2021.  His left ventricular internal dimension in diastole is 5.9 cm.  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  There is moderate pulmonary hypertension with a PA pressure of 65/21 and a mean of 40.  Mean right atrial pressure was 10.  I think that it is time to proceed with aortic valve replacement for relief of his symptoms and to prevent progressive left ventricular deterioration.  Given his age of 81 I think transcatheter aortic valve replacement would be the best option for him.  His gated cardiac CTA shows a heavily calcified trileaflet  aortic valve with anatomy suitable for TAVR using a 29 mm SAPIEN 3 valve.  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  Abdominal and pelvic CTA shows adequate pelvic vascular anatomy to allow transfemoral insertion.    DETAILS OF THE OPERATIVE PROCEDURE  PREPARATION:    The patient was brought to the operating room on the above mentioned date and appropriate monitoring was established by the anesthesia team. The patient was placed in the supine position on the operating table.  Intravenous antibiotics were administered. The patient was monitored closely throughout the procedure under conscious sedation.  Baseline transthoracic echocardiogram was performed. The patient's abdomen and both groins were prepped and draped in a sterile manner. A time out procedure was performed.   PERIPHERAL ACCESS:    Using the modified Seldinger technique, femoral arterial and venous access was obtained with placement of 6 Fr sheaths on the left side.  A pigtail diagnostic catheter was passed through the left arterial sheath under fluoroscopic guidance into the aortic root.  A temporary transvenous pacemaker catheter was passed through the left femoral venous sheath under fluoroscopic guidance into the right ventricle.  The pacemaker was tested to ensure stable lead placement and pacemaker capture. Aortic root angiography was performed in order to determine the optimal angiographic angle for valve deployment.   TRANSFEMORAL ACCESS:   Percutaneous transfemoral access and sheath placement was performed using ultrasound guidance.  The Right common femoral artery was cannulated using a micropuncture needle and appropriate location was verified using hand injection angiogram.  A pair of Abbott Perclose percutaneous closure devices were placed and a 6 French sheath replaced into the femoral artery.  The patient was heparinized systemically and ACT verified > 250 seconds.    A 14 Fr transfemoral E-sheath was  introduced into the right common femoral artery after progressively dilating over an Amplatz superstiff wire. An AL2 catheter was used to direct a J-tip exchange length wire across the native aortic valve into the left ventricle. This was exchanged out for a pigtail catheter and position was confirmed in the LV apex. Simultaneous LV and Ao pressures were recorded.  The pigtail catheter was exchanged for a Safari wire in the LV apex.   BALLOON AORTIC VALVULOPLASTY:   Not performed   TRANSCATHETER HEART VALVE DEPLOYMENT:   An Edwards Sapien 3 Ultra transcatheter heart valve (size 23 mm) was prepared and crimped per manufacturer's guidelines, and the proper orientation of the valve is confirmed on the [employer] delivery system. The valve was advanced through the introducer sheath using normal technique until in an appropriate position in the abdominal aorta beyond the sheath tip. The balloon was then retracted and using the fine-tuning wheel was centered on the valve. The valve was then advanced across the aortic arch using appropriate flexion of the catheter. The valve was carefully positioned across the aortic valve annulus. The Commander catheter was retracted using normal technique. Once final position of the valve has been confirmed by angiographic assessment, the valve is deployed during rapid ventricular pacing to maintain systolic blood pressure < 50 mmHg and pulse pressure < 10 mmHg. The balloon inflation is held for >3 seconds after reaching full deployment volume. Once the balloon has fully deflated the balloon is retracted into the ascending aorta and valve function is assessed using echocardiography. There is felt to be no paravalvular leak and no central aortic insufficiency.  The patient's hemodynamic recovery following valve deployment is good.  The deployment balloon and guidewire are both removed.    PROCEDURE COMPLETION:   The sheath was removed and femoral artery closure  performed.  Protamine was administered once femoral arterial repair was complete. The temporary pacemaker, pigtail catheter and femoral sheaths were removed with manual pressure used for venous hemostasis.  A Mynx femoral closure device was utilized following removal of the diagnostic sheath in the left femoral artery.  The patient tolerated the procedure well and is transported to the cath lab recovery area in stable condition. There were no immediate intraoperative complications. All sponge instrument and needle counts are verified correct at completion of the operation.   No blood products were administered during the operation.     Coralie Common, MD 02/28/2022 11:51 AM

## 2022-02-28 NOTE — Discharge Summary (Signed)
[place] VALVE TEAM  Discharge Summary    Patient ID: Devin Buckley MRN: [id number]; DOB: 1941-03-06  Admit date: 02/28/2022 Discharge date: 03/02/2022  Primary Care Provider: Rusty Aus, MD  Primary Cardiologist: Larae Grooms, MD / Dr. Angelena Form, MD & Dr. Lavonna Monarch, MD/ Dr. Cyndia Bent, MD (TAVR)  Discharge Diagnoses    Principal Problem:   Severe aortic stenosis Active Problems:   Hyperlipemia, mixed   Atrial fibrillation, chronic (HCC)   Chronic combined systolic (congestive) and diastolic (congestive) heart failure (HCC)   HTN (hypertension)   S/P TAVR (transcatheter aortic valve replacement)   Hypokalemia  Allergies No Known Allergies  Diagnostic Studies/Procedures      TAVR OPERATIVE NOTE     Date of Procedure:                02/28/2022   Preoperative Diagnosis:      Severe Aortic Stenosis    Postoperative Diagnosis:    Same    Procedure:        Transcatheter Aortic Valve Replacement - Percutaneous Left Transfemoral Approach             Edwards Sapien 3 Ultra THV (size 29 mm, model # 9755RSL, serial # [id number])              Co-Surgeons:                        Coralie Common MD and Lauree Chandler  Anesthesiologist:                  Albertha Ghee MD   Echocardiographer:              Dr Marisue Ivan   Pre-operative Echo Findings: Severe aortic stenosis Normal left ventricular systolic function   Post-operative Echo Findings: No paravalvular leak Normal left ventricular systolic function ____________   Echo 03/01/22: Completed but pending formal read at the time of discharge   History of Present Illness     Devin Buckley is a 81 y.o. male with a history of HTN, HLD, permanent atrial fibrillation on Eliquis, chronic combined S/D CHF and severe AS who presented to [facility] on 02/28/22 for planned TAVR.   Mr. Coats is followed very closely by Dr. Irish Lack for his aortic stenosis. Echocardiogram from 09/2021 showed an LVEF  at 50-55%, normal RV function, trivial MR with a thickened and calcified aortic valve with poor leaflet excursion, mean gradient 63 mmHg, peak gradient 93.7 mmHg, AVA 0.51 cm2, DI 0.13, SVI 28, consistent with severe/critical AS. He was set up for cardiac catheterization 02/03/22 which showed mild CAD and moderate pulmonary HTN. Pre TAVR cardiac CT performed 02/08/22 demonstrated anatomy suitable for a 29 mm Edwards Sapien 3 THV.   He was then evaluated by the multidisciplinary valve team and felt to have severe, symptomatic aortic stenosis and to be a suitable candidate for TAVR, which was set up for 03/01/22.      Hospital Course    Severe AS: s/p successful TAVR with a 29 mm Edwards Sapien 3 THV via the TF approach on 02/28/22. Post operative echo pending. Groin sites remain stable. ECG with AF and no high grade heart block. BP was soft in the post procedure setting requiring pressor support. Given this, he was kept in the ICU overnight and transferred to progressive care on POD 1. He was restarted on home dose Eliquis with no bleeding issues. He has ambulated with CR  without complication. SBE discussed and will be RX'ed at follow up next week. Post procedure instructions reviewed with understanding. Plan for TOC follow up next week, then one month with repeat echocardiogram.   HTN: Soft but stable. Central pressures with SBP in the 110 range. Cuff SBPs in the upper 90's. No dizziness.   Permanent atrial fibrillation on Eliquis: Rates stable in the 50's. No on AV blocking agents. Continue Eliquis.   Chronic combined systolic and diastolic CHF: Appears euvolemic on exam today with no LE edema, no SOB.   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 MRI/MRA assist with further characterization. Focal linear filling defect of the  infrarenal abdominal aorta, differential considerations include small focal dissection or linear intraluminal plaque. Moderate right and small left pleural effusions with atelectasis. Trace abdominopelvic ascites and diffuse soft tissue anasarca. Dr. Cyndia Bent reviewed possible AV fistula with Dr. Carlis Abbott and felt no further w/u necessary   Consultants: None    The patient has been seen and examined by Dr. Angelena Form who feels that he is stable and ready for discharge today, 03/01/22.  _____________  Discharge Vitals Blood pressure (!) 97/53, pulse (!) 55, temperature 98.6 F (37 C), temperature source Oral, resp. rate 16, height 6' 2"  (1.88 m), weight 94.2 kg, SpO2 94 %.  Filed Weights   02/28/22 0835 03/01/22 0530 03/02/22 0430  Weight: 90.7 kg 92.1 kg 94.2 kg   General: Well developed, well nourished, NAD Lungs:Clear to ausculation bilaterally. No wheezes, rales, or rhonchi. Breathing is unlabored. Cardiovascular: Irregularly irregular. Soft flow murmur Abdomen: Soft, non-tender, non-distended. No obvious abdominal masses. Extremities: No edema. Groin sites stable with no bleeding or hematoma.  Neuro: Alert and oriented. No focal deficits. No facial asymmetry. MAE spontaneously. Psych: Responds to questions appropriately with normal affect.    Labs & Radiologic Studies    CBC Recent Labs    02/28/22 1420 03/01/22 0655  WBC  --  6.5  HGB 13.3 13.1  HCT 39.0 39.7  MCV  --  98.5  PLT  --  893*   Basic Metabolic Panel Recent Labs    02/28/22 1420 03/01/22 0655  NA 142 142  K 3.4* 3.3*  CL 99 103  CO2  --  31  GLUCOSE 107* 121*  BUN 23 16  CREATININE 0.80 0.84  CALCIUM  --  7.7*  MG  --  1.6*   Liver Function Tests No results for input(s): "AST", "ALT", "ALKPHOS", "BILITOT", "PROT", "ALBUMIN" in the last 72 hours. No results for input(s): "LIPASE", "AMYLASE" in the last 72 hours. Cardiac Enzymes No results for input(s): "CKTOTAL", "CKMB", "CKMBINDEX", "TROPONINI" in the  last 72 hours. BNP Invalid input(s): "POCBNP" D-Dimer No results for input(s): "DDIMER" in the last 72 hours. Hemoglobin A1C No results for input(s): "HGBA1C" in the last 72 hours. Fasting Lipid Panel No results for input(s): "CHOL", "HDL", "LDLCALC", "TRIG", "CHOLHDL", "LDLDIRECT" in the last 72 hours. Thyroid Function Tests No results for input(s): "TSH", "T4TOTAL", "T3FREE", "THYROIDAB" in the last 72 hours.  Invalid input(s): "FREET3" _____________  ECHOCARDIOGRAM LIMITED  Result Date: 02/28/2022    ECHOCARDIOGRAM LIMITED REPORT   Patient Name:   ISAIHA ASARE Date of Exam: 02/28/2022 Medical Rec #:  [id number]     Height:       74.0 in Accession #:    [id number]    Weight:  200.0 lb Date of Birth:  1941-03-05      BSA:          2.173 m Patient Age:    43 years      BP:           79/51 mmHg Patient Gender: M             HR:           72 bpm. Exam Location:  Inpatient Procedure: Limited Echo, Cardiac Doppler and Color Doppler Indications:     I35.0 Nonrheumatic aortic (valve) stenosis  History:         Patient has prior history of Echocardiogram examinations. CHF,                  Abnormal ECG, Aortic Valve Disease, Arrythmias:Atrial                  Fibrillation; Risk Factors:Hypertension.                  Aortic Valve: 29 mm Sapien prosthetic, stented (TAVR) valve is                  present in the aortic position. Procedure Date: 02/28/2022.  Sonographer:     Roseanna Rainbow RDCS Referring Phys:  [place] Diagnosing Phys: Eleonore Chiquito MD IMPRESSIONS  1. Echo guided TAVR. 29 mm S3. Vmax 1.8 m/s, MG 7 mmHG, EOA 2.9 cm2, DI 0.64. No paravalvular leak or regurgitation. The aortic valve has been repaired/replaced. There is a 29 mm Sapien prosthetic (TAVR) valve present in the aortic position. Procedure Date: 02/28/2022.  2. Left ventricular ejection fraction, by estimation, is 50 to 55%. The left ventricle has low normal function. The left ventricle has no regional wall motion  abnormalities. There is moderate concentric left ventricular hypertrophy.  3. Right ventricular systolic function is mildly reduced. The right ventricular size is mildly enlarged. There is severely elevated pulmonary artery systolic pressure.  4. The mitral valve is grossly normal. Trivial mitral valve regurgitation.  5. Tricuspid valve regurgitation is mild to moderate.  6. Aortic dilatation noted. Aneurysm of the ascending aorta, measuring 44 mm. FINDINGS  Left Ventricle: Left ventricular ejection fraction, by estimation, is 50 to 55%. The left ventricle has low normal function. The left ventricle has no regional wall motion abnormalities. The left ventricular internal cavity size was normal in size. There is moderate concentric left ventricular hypertrophy. Right Ventricle: The right ventricular size is mildly enlarged. No increase in right ventricular wall thickness. Right ventricular systolic function is mildly reduced. There is severely elevated pulmonary artery systolic pressure. The tricuspid regurgitant velocity is 3.42 m/s, and with an assumed right atrial pressure of 15 mmHg, the estimated right ventricular systolic pressure is 63.8 mmHg. Pericardium: Trivial pericardial effusion is present. Mitral Valve: The mitral valve is grossly normal. Trivial mitral valve regurgitation. Tricuspid Valve: The tricuspid valve is grossly normal. Tricuspid valve regurgitation is mild to moderate. No evidence of tricuspid stenosis. Aortic Valve: Echo guided TAVR. 29 mm S3. Vmax 1.8 m/s, MG 7 mmHG, EOA 2.9 cm2, DI 0.64. No paravalvular leak or regurgitation. The aortic valve has been repaired/replaced. Aortic valve mean gradient measures 7.0 mmHg. Aortic valve peak gradient measures  13.2 mmHg. Aortic valve area, by VTI measures 2.93 cm. There is a 29 mm Sapien prosthetic, stented (TAVR) valve present in the aortic position. Procedure Date: 02/28/2022. Aorta: Aortic dilatation noted and the aortic root is normal in size  and structure. There is an aneurysm involving the ascending aorta measuring 44 mm. LEFT VENTRICLE PLAX 2D LVIDd:         5.40 cm LVIDs:         3.63 cm LV PW:         1.50 cm LV IVS:        1.50 cm LVOT diam:     2.42 cm LV SV:         106 LV SV Index:   49 LVOT Area:     4.60 cm  LV Volumes (MOD) LV vol d, MOD A2C: 177.0 ml LV vol d, MOD A4C: 149.0 ml LV vol s, MOD A2C: 76.7 ml LV vol s, MOD A4C: 72.8 ml LV SV MOD A2C:     100.3 ml LV SV MOD A4C:     149.0 ml LV SV MOD BP:      87.9 ml AORTIC VALVE AV Area (Vmax):    3.04 cm AV Area (Vmean):   2.95 cm AV Area (VTI):     2.93 cm AV Vmax:           181.50 cm/s AV Vmean:          119.000 cm/s AV VTI:            0.363 m AV Peak Grad:      13.2 mmHg AV Mean Grad:      7.0 mmHg LVOT Vmax:         119.90 cm/s LVOT Vmean:        76.350 cm/s LVOT VTI:          0.231 m LVOT/AV VTI ratio: 0.64  AORTA Ao Asc diam: 4.30 cm TRICUSPID VALVE TR Peak grad:   46.8 mmHg TR Vmax:        342.00 cm/s  SHUNTS Systemic VTI:  0.23 m Systemic Diam: 2.42 cm Eleonore Chiquito MD Electronically signed by Eleonore Chiquito MD Signature Date/Time: 02/28/2022/1:59:25 PM    Final    Structural Heart Procedure  Result Date: 02/28/2022 See surgical note for result.  DG Chest 2 View  Result Date: 02/26/2022 CLINICAL DATA:  81 year old male with history of severe aortic stenosis. Preoperative study prior to transcatheter aortic valve replacement (TAVR) procedure. EXAM: CHEST - 2 VIEW COMPARISON:  Chest x-ray 06/27/2021. FINDINGS: Moderate right pleural effusion with opacity in the medial aspect of the right lung base which may reflect atelectasis and/or consolidation. Trace left pleural effusion. Mild diffuse interstitial prominence and peribronchial cuffing, more apparent than prior examinations. No pneumothorax. No evidence of pulmonary edema. Heart size is normal. Upper mediastinal contours are within normal limits. Atherosclerotic calcifications are noted in the thoracic aorta. IMPRESSION: 1.  Moderate right pleural effusion with atelectasis and/or consolidation in the right lung base. 2. Trace left pleural effusion. 3. Diffuse peribronchial cuffing and interstitial prominence concerning for an acute bronchitis. 4. Aortic atherosclerosis. Electronically Signed   By: Vinnie Langton M.D.   On: 02/26/2022 10:37   CT CORONARY MORPH W/CTA COR W/SCORE W/CA W/CM &/OR WO/CM  Addendum Date: 02/08/2022   ADDENDUM REPORT: 02/08/2022 12:40 FINDINGS: Extracardiac findings will be described separately under dictation for contemporaneously obtained CTA chest, abdomen and pelvis. IMPRESSION: Please see separate dictation for contemporaneously obtained CTA chest, abdomen and pelvis dated 02/08/2022 for full description of relevant extracardiac findings. Electronically Signed   By: Yetta Glassman M.D.   On: 02/08/2022 12:40   Result Date: 02/08/2022 CLINICAL DATA:  Aortic Stenosis EXAM: Cardiac TAVR CT TECHNIQUE:  The patient was scanned on a Siemens Force 834 slice scanner. A 120 kV retrospective scan was triggered in the ascending thoracic aorta at 140 HU's. Gantry rotation speed was 250 msecs and collimation was .6 mm. No beta blockade or nitro were given. The 3D data set was reconstructed in 5% intervals of the R-R cycle. Systolic and diastolic phases were analyzed on a dedicated work station using MPR, MIP and VRT modes. The patient received 80 cc of contrast. FINDINGS: Aortic Valve: Heavily calcified tri leaflet AV score 5996 Aorta: Dilated ascending thoracic aorta. Normal arch vessels moderate calcific atherosclerosis Sino-tubular Junction: 33 mm Ascending Thoracic Aorta: Dilated 41 mm Aortic Arch: 28 mm Descending Thoracic Aorta: 29 mm Sinus of Valsalva Measurements: Non-coronary: 36.7 mm Right - coronary: 35.6 mm Left -   coronary: 33.9 mm Coronary Artery Height above Annulus: Left Main: 14.3 mm above annulus Right Coronary: 14 mm above annulus Virtual Basal Annulus Measurements: Maximum / Minimum  Diameter: 30.9 mm x 22.8 mm Perimeter: 90.7 mm Area: 595 mm2 Coronary Arteries: Sufficient height above annulus for deployment Optimum Fluoroscopic Angle for Delivery: LAO 14 Caudal 14 IMPRESSION: 1. Tri leaflet AV with calcium score 5996 2.  Annular area of 595 mm2 suitable for a 29 mm Sapien 3 valve 3.  Dilated ascending thoracic aorta 4.1 cm 4. Optimum angiographic angle for deployment LAO 14 Caudal 14 degrees 5.  Membranous septal length 12.3 mm Jenkins Rouge Electronically Signed: By: Jenkins Rouge M.D. On: 02/08/2022 12:32   CT ANGIO ABDOMEN PELVIS  W &/OR WO CONTRAST  Result Date: 02/08/2022 CLINICAL DATA:  Preop evaluation for TAVR EXAM: CTA ABDOMEN AND PELVIS WITHOUT AND WITH CONTRAST TECHNIQUE: Multidetector CT imaging of the abdomen and pelvis was performed using the standard protocol during bolus administration of intravenous contrast. Multiplanar reconstructed images and MIPs were obtained and reviewed to evaluate the vascular anatomy. RADIATION DOSE REDUCTION: This exam was performed according to the departmental dose-optimization program which includes automated exposure control, adjustment of the mA and/or kV according to patient size and/or use of iterative reconstruction technique. CONTRAST:  19m OMNIPAQUE IOHEXOL 350 MG/ML SOLN COMPARISON:  None Available. CTA CHEST FINDINGS Cardiovascular: Cardiomegaly. Trace pericardial recess fluid. Aortic valve thickening and calcifications. Normal caliber thoracic aorta with moderate calcified and noncalcified plaque.Left main and 3 vessel coronary artery disease. Mediastinum/Nodes: Esophagus and thyroid are unremarkable. No pathologically enlarged lymph nodes seen in the chest. Lungs/Pleura: Central airways are patent. Moderate right and trace left pleural effusions with bibasilar atelectasis. No evidence of pneumothorax. Musculoskeletal: No chest wall abnormality. No acute or significant osseous findings. CTA ABDOMEN AND PELVIS FINDINGS Hepatobiliary:  No focal liver abnormality is seen. No gallstones, gallbladder wall thickening, or biliary dilatation. Pancreas: Unremarkable. No pancreatic ductal dilatation or surrounding inflammatory changes. Spleen: Normal in size without focal abnormality. Adrenals/Urinary Tract: Bilateral adrenal glands are unremarkable. Bilateral simple renal cysts, no further follow-up imaging is recommended. Bladder is unremarkable. Stomach/Bowel: Stomach is within normal limits. Appendix appears normal. Severe diverticulosis of the sigmoid colon. No evidence of bowel wall thickening, distention, or inflammatory changes. Vascular/lymphatic: Normal caliber abdominal aorta with severe predominantly calcified plaque. Focal linear filling defect of the infrarenal abdominal aorta seen on series 3 image 378. Dilation and asymmetric opacification of the right iliac veins and numerous small adjacent collateral vessels. External iliac vein measures up to 3.7 cm. Major aortic branch vessels are patent. No pathologically enlarged lymph nodes seen in the chest. Reproductive: Unremarkable. Other: Small left-greater-than-right fat containing inguinal hernias. Diffuse  soft tissue anasarca. Trace abdominopelvic ascites. Musculoskeletal: No acute or significant osseous findings. VASCULAR MEASUREMENTS PERTINENT TO TAVR: AORTA: Minimal Aortic Diameter -   mm Severity of Aortic Calcification-severe RIGHT PELVIS: Right Common Iliac Artery - Minimal Diameter-9.4 mm Tortuosity-severe Calcification-severe Right External Iliac Artery - Minimal Diameter-10.1 mm Tortuosity-severe Calcification-moderate Right Common Femoral Artery - Minimal Diameter-9.6 mm Tortuosity-none Calcification-mild LEFT PELVIS: Left Common Iliac Artery - Minimal Diameter-11.6 mm Tortuosity-severe Calcification-severe Left External Iliac Artery - Minimal Diameter-10.1 mm Tortuosity-severe Calcification-moderate Left Common Femoral Artery - Minimal Diameter-10.7 mm Tortuosity-none  Calcification-moderate Review of the MIP images confirms the above findings. IMPRESSION: VASCULAR 1. Vascular findings and measurements pertinent to potential TAVR procedure, as detailed above. 2. Thickening and calcification of the aortic valve, compatible with reported clinical history of aortic stenosis. 3. 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 4. 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 MRI/MRA assist with further characterization. 5. Focal linear filling defect of the infrarenal abdominal aorta, differential considerations include small focal dissection or linear intraluminal plaque. NON-VASCULAR 1. Moderate right and small left pleural effusions with atelectasis. 2. Trace abdominopelvic ascites and diffuse soft tissue anasarca. Electronically Signed   By: Yetta Glassman M.D.   On: 02/08/2022 12:39   CT ANGIO CHEST AORTA W/CM & OR WO/CM  Result Date: 02/08/2022 CLINICAL DATA:  Preop evaluation for TAVR EXAM: CTA ABDOMEN AND PELVIS WITHOUT AND WITH CONTRAST TECHNIQUE: Multidetector CT imaging of the abdomen and pelvis was performed using the standard protocol during bolus administration of intravenous contrast. Multiplanar reconstructed images and MIPs were obtained and reviewed to evaluate the vascular anatomy. RADIATION DOSE REDUCTION: This exam was performed according to the departmental dose-optimization program which includes automated exposure control, adjustment of the mA and/or kV according to patient size and/or use of iterative reconstruction technique. CONTRAST:  154m OMNIPAQUE IOHEXOL 350 MG/ML SOLN COMPARISON:  None Available. CTA CHEST FINDINGS Cardiovascular: Cardiomegaly. Trace pericardial recess fluid. Aortic valve thickening and calcifications. Normal caliber thoracic aorta with  moderate calcified and noncalcified plaque.Left main and 3 vessel coronary artery disease. Mediastinum/Nodes: Esophagus and thyroid are unremarkable. No pathologically enlarged lymph nodes seen in the chest. Lungs/Pleura: Central airways are patent. Moderate right and trace left pleural effusions with bibasilar atelectasis. No evidence of pneumothorax. Musculoskeletal: No chest wall abnormality. No acute or significant osseous findings. CTA ABDOMEN AND PELVIS FINDINGS Hepatobiliary: No focal liver abnormality is seen. No gallstones, gallbladder wall thickening, or biliary dilatation. Pancreas: Unremarkable. No pancreatic ductal dilatation or surrounding inflammatory changes. Spleen: Normal in size without focal abnormality. Adrenals/Urinary Tract: Bilateral adrenal glands are unremarkable. Bilateral simple renal cysts, no further follow-up imaging is recommended. Bladder is unremarkable. Stomach/Bowel: Stomach is within normal limits. Appendix appears normal. Severe diverticulosis of the sigmoid colon. No evidence of bowel wall thickening, distention, or inflammatory changes. Vascular/lymphatic: Normal caliber abdominal aorta with severe predominantly calcified plaque. Focal linear filling defect of the infrarenal abdominal aorta seen on series 3 image 378. Dilation and asymmetric opacification of the right iliac veins and numerous small adjacent collateral vessels. External iliac vein measures up to 3.7 cm. Major aortic branch vessels are patent. No pathologically enlarged lymph nodes seen in the chest. Reproductive: Unremarkable. Other: Small left-greater-than-right fat containing inguinal hernias. Diffuse soft tissue anasarca. Trace abdominopelvic ascites. Musculoskeletal: No acute or significant osseous findings. VASCULAR MEASUREMENTS PERTINENT TO TAVR: AORTA: Minimal Aortic  Diameter -   mm Severity of Aortic Calcification-severe RIGHT PELVIS: Right Common Iliac Artery - Minimal Diameter-9.4 mm Tortuosity-severe  Calcification-severe Right External Iliac Artery - Minimal Diameter-10.1 mm Tortuosity-severe Calcification-moderate Right Common Femoral Artery - Minimal Diameter-9.6 mm Tortuosity-none Calcification-mild LEFT PELVIS: Left Common Iliac Artery - Minimal Diameter-11.6 mm Tortuosity-severe Calcification-severe Left External Iliac Artery - Minimal Diameter-10.1 mm Tortuosity-severe Calcification-moderate Left Common Femoral Artery - Minimal Diameter-10.7 mm Tortuosity-none Calcification-moderate Review of the MIP images confirms the above findings. IMPRESSION: VASCULAR 1. Vascular findings and measurements pertinent to potential TAVR procedure, as detailed above. 2. Thickening and calcification of the aortic valve, compatible with reported clinical history of aortic stenosis. 3. 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 4. 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 MRI/MRA assist with further characterization. 5. Focal linear filling defect of the infrarenal abdominal aorta, differential considerations include small focal dissection or linear intraluminal plaque. NON-VASCULAR 1. Moderate right and small left pleural effusions with atelectasis. 2. Trace abdominopelvic ascites and diffuse soft tissue anasarca. Electronically Signed   By: Yetta Glassman M.D.   On: 02/08/2022 12:39   CARDIAC CATHETERIZATION  Result Date: 02/03/2022   Prox LAD to Mid LAD lesion is 25% stenosed.   Hemodynamic findings consistent with moderate pulmonary hypertension.   Aortic valve not crossed due to known severe aortic stenosis requiring valve replacement. Nonobstructive, mild coronary artery disease. Moderate pulmonary artery hypertension. TAVR team contacted.  CT scans to be arranged. Results conveyed to wife, Suanne Marker.    Disposition    Pt is being discharged home today in good condition.  Follow-up Plans & Appointments    Follow-up Information     Tommie Raymond, NP Follow up on 03/08/2022.   Specialty: Cardiology Why: @ 945am. Please arrive at 9:30am to your appointment. Contact information: [address] [place] [address] [phone]                Discharge Instructions     Amb Referral to Cardiac Rehabilitation   Complete by: As directed    Diagnosis: Valve Replacement   Valve: Aortic Comment - TAVR   After initial evaluation and assessments completed: Virtual Based Care may be provided alone or in conjunction with Phase 2 Cardiac Rehab based on patient barriers.: Yes   Intensive Cardiac Rehabilitation (ICR) [place] location only OR Traditional Cardiac Rehabilitation (TCR) *If criteria for ICR are not met will enroll in TCR [facility] only): Yes   Call MD for:  difficulty breathing, headache or visual disturbances   Complete by: As directed    Call MD for:  extreme fatigue   Complete by: As directed    Call MD for:  hives   Complete by: As directed    Call MD for:  persistant dizziness or light-headedness   Complete by: As directed    Call MD for:  persistant nausea and vomiting   Complete by: As directed    Call MD for:  redness, tenderness, or signs of infection (pain, swelling, redness, odor or green/yellow discharge around incision site)   Complete by: As directed    Call MD for:  severe uncontrolled pain   Complete by: As directed    Call MD for:  temperature >100.4   Complete by: As directed    Change dressing (specify)   Complete by: As directed  You may take the dressing off and change to a Bandaid or leave it open to air.   Diet - low sodium heart healthy   Complete by: As directed    Discharge instructions   Complete by: As directed    ACTIVITY AND EXERCISE  Daily activity and exercise are an important part of your recovery. People recover at different rates  depending on their general health and type of valve procedure.  Most people recovering from TAVR feel better relatively quickly   No lifting, pushing, pulling more than 10 pounds (examples to avoid: groceries, vacuuming, gardening, golfing):             - For one week with a procedure through the groin.             - For six weeks for procedures through the chest wall or neck. NOTE: You will typically see one of our providers 7-14 days after your procedure to discuss [place] the above activities.      DRIVING  Do not drive until you are seen for follow up and cleared by a provider. Generally, we ask patient to not drive for 1 week after their procedure.  If you have been told by your doctor in the past that you may not drive, you must talk with him/her before you begin driving again.   DRESSING  Groin site: you may leave the clear dressing over the site for up to one week or until it falls off.   HYGIENE  If you had a femoral (leg) procedure, you may take a shower when you return home. After the shower, pat the site dry. Do NOT use powder, oils or lotions in your groin area until the site has completely healed.  If you had a chest procedure, you may shower when you return home unless specifically instructed not to by your discharging practitioner.             - DO NOT scrub incision; pat dry with a towel.             - DO NOT apply any lotions, oils, powders to the incision.             - No tub baths / swimming for at least 2 weeks.  If you notice any fevers, chills, increased pain, swelling, bleeding or pus, please contact your doctor.   ADDITIONAL INFORMATION  If you are going to have an upcoming dental procedure, please contact our office as you will require antibiotics ahead of time to prevent infection on your heart valve.    If you have any questions or concerns you can call the structural heart phone during normal business hours 8am-4pm. If you have an urgent need  after hours or weekends please call [phone] to talk to the on call provider for general cardiology. If you have an emergency that requires immediate attention, please call 911.    After TAVR Checklist  Check  Test Description  Follow up appointment in 1-2 weeks  You will see our structural heart advanced practice provider. Your incision sites will be checked and you will be cleared to drive and resume all normal activities if you are doing well.    1 month echo and follow up  You will have an echo to check on your new heart valve and be seen back in the office by a structural heart advanced practice provider.  Follow up with your primary cardiologist You will need to  be seen by your primary cardiologist in the following 3-6 months after your 1 month appointment in the valve clinic.   1 year echo and follow up You will have another echo to check on your heart valve after 1 year and be seen back in the office by a structural heart advanced practice provider. This your last structural heart visit.  Bacterial endocarditis prophylaxis  You will have to take antibiotics for the rest of your life before all dental procedures (even teeth cleanings) to protect your heart valve. Antibiotics are also required before some surgeries. Please check with your cardiologist before scheduling any surgeries. Also, please make sure to tell us if you have a penicillin allergy as you will require an alternative antibiotic.   Increase activity slowly   Complete by: As directed       Discharge Medications   Allergies as of 03/02/2022   No Known Allergies      Medication List     STOP taking these medications    metoprolol tartrate 100 MG tablet Commonly known as: LOPRESSOR       TAKE these medications    apixaban 5 MG Tabs tablet Commonly known as: ELIQUIS Take 1 tablet (5 mg total) by mouth 2 (two) times daily.   buprenorphine-naloxone 8-2 mg Subl SL tablet Commonly known as: SUBOXONE Place 1.5  tablets under the tongue daily.   clonazePAM 0.5 MG tablet Commonly known as: KLONOPIN Take 0.5 mg by mouth 2 (two) times daily.   COLLAGEN PO Take 2 Scoops by mouth 3 (three) times daily.   Digestive Enzyme Caps Take 2 capsules by mouth 2 (two) times daily.   furosemide 40 MG tablet Commonly known as: LASIX Take 40 mg by mouth 2 (two) times daily as needed for fluid.   hydrocortisone 2.5 % cream Apply 1 application topically daily as needed for rash.   multivitamin with minerals Tabs tablet Take 3 tablets by mouth daily. Total minerals   PROBIOTIC PO Take 3 capsules by mouth 3 (three) times daily. total flora   VITAMIN D-VITAMIN K PO Take 1 capsule by mouth daily.               Discharge Care Instructions  (From admission, onward)           Start     Ordered   03/02/22 0000  Change dressing (specify)       Comments: You may take the dressing off and change to a Bandaid or leave it open to air.   03/02/22 0850           Outstanding Labs/Studies   None   Duration of Discharge Encounter   Greater than 30 minutes including physician time.  SignedKathyrn Drown, NP 03/02/2022, 8:50 AM [phone]

## 2022-02-28 NOTE — Progress Notes (Signed)
  [facility] VALVE TEAM  Patient doing well s/p TAVR. He is hemodynamically stable although remains on low dose Levophed until he wakes up more from anesthesia.  Groin sites stable. ECG with AF and no high grade block. Plan to DC arterial line and transfer to 4E. Plan for early ambulation after bedrest completed and hopeful discharge over the next 24-48 hours.   Kathyrn Drown NP-C Structural Heart Team  Pager: [phone] Phone: [phone]

## 2022-02-28 NOTE — Progress Notes (Signed)
EVENING ROUNDS NOTE :     [address]       [place] [address]             [phone]                 Day of Surgery Procedure(s) (LRB): Transcatheter Aortic Valve Replacement, Transfemoral using a 29 MM Edwards SAPIEN 3 Ultra. (N/A) INTRAOPERATIVE TRANSTHORACIC ECHOCARDIOGRAM (N/A)   Total Length of Stay:  LOS: 0 days  Events:   No events Afib, rate controlled On low dose Levo      BP 99/61   Pulse (!) 42   Temp 97.6 F (36.4 C)   Resp 16   Ht '6\' 2"'$  (1.88 m)   Wt 90.7 kg   SpO2 99%   BMI 25.68 kg/m          sodium chloride 50 mL/hr at 02/28/22 1631   sodium chloride     sodium chloride      ceFAZolin (ANCEF) IV     nitroGLYCERIN      No intake/output data recorded.      Latest Ref Rng & Units 02/28/2022    2:20 PM 02/28/2022   11:55 AM 02/24/2022   10:31 AM  CBC  WBC 4.0 - 10.5 K/uL   4.7   Hemoglobin 13.0 - 17.0 g/dL 13.3  12.9  14.8   Hematocrit 39.0 - 52.0 % 39.0  38.0  46.6   Platelets 150 - 400 K/uL   132        Latest Ref Rng & Units 02/28/2022    2:20 PM 02/28/2022   11:55 AM 02/24/2022   10:31 AM  BMP  Glucose 70 - 99 mg/dL 107  100  107   BUN 8 - 23 mg/dL '23  21  23   '$ Creatinine 0.61 - 1.24 mg/dL 0.80  0.90  1.08   Sodium 135 - 145 mmol/L 142  140  139   Potassium 3.5 - 5.1 mmol/L 3.4  3.2  3.8   Chloride 98 - 111 mmol/L 99  97  99   CO2 22 - 32 mmol/L   33   Calcium 8.9 - 10.3 mg/dL   8.5     ABG    Component Value Date/Time   PHART 7.447 02/03/2022 1021   PCO2ART 45.4 02/03/2022 1021   PO2ART 56 (L) 02/03/2022 1021   HCO3 35.0 (H) 02/03/2022 1025   HCO3 33.3 (H) 02/03/2022 1025   TCO2 32 02/28/2022 1420   O2SAT 73 02/03/2022 1025   O2SAT 73 02/03/2022 [place], MD 02/28/2022 6:00 PM

## 2022-02-28 NOTE — Op Note (Addendum)
[facility]   MULTIDISCIPLINARY HEART VALVE TEAM   TAVR OPERATIVE NOTE   Date of Procedure:  02/28/2022  Preoperative Diagnosis: Severe Aortic Stenosis   Postoperative Diagnosis: Same   Procedure:   Transcatheter Aortic Valve Replacement - Percutaneous Left Transfemoral Approach  Edwards Sapien 3 Ultra THV (size 29 mm, model # 9755RSL, serial # [id number])   Co-Surgeons:  Coralie Common MD and Lauree Chandler  Anesthesiologist:  Albertha Ghee MD  Echocardiographer:  Dr Marisue Ivan  Pre-operative Echo Findings: Severe aortic stenosis Normal left ventricular systolic function  Post-operative Echo Findings: No paravalvular leak Normal left ventricular systolic function   BRIEF CLINICAL NOTE AND INDICATIONS FOR SURGERY  This 81 year old gentleman has stage D, severe, symptomatic aortic stenosis with New York Heart Association class II symptoms of exertional fatigue and shortness of breath as well as significant lower extremity edema consistent with chronic diastolic congestive heart failure.  I have personally reviewed his 2D echocardiogram, cardiac catheterization, and CTA studies.  His 2D echo shows a severely calcified aortic valve with restricted mobility with a mean gradient of 55 mmHg consistent with critical aortic stenosis.  Left ventricular ejection fraction is mildly reduced to 45 to 50% and was 55% in January 2021.  His left ventricular internal dimension in diastole is 5.9 cm.  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  There is moderate pulmonary hypertension with a PA pressure of 65/21 and a mean of 40.  Mean right atrial pressure was 10.  I think that it is time to proceed with aortic valve replacement for relief of his symptoms and to prevent progressive left ventricular deterioration.  Given his age of 42 I think transcatheter aortic valve replacement would be the best option for him.  His gated cardiac CTA shows a  heavily calcified trileaflet aortic valve with anatomy suitable for TAVR using a 29 mm SAPIEN 3 valve.  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  Abdominal and pelvic CTA shows adequate pelvic vascular anatomy to allow transfemoral insertion.      DETAILS OF THE OPERATIVE PROCEDURE  PREPARATION:    The patient was brought to the operating room on the above mentioned date and appropriate monitoring was established by the anesthesia team. The patient was placed in the supine position on the operating table.  Intravenous antibiotics were administered. The patient was monitored closely throughout the procedure under conscious sedation.   Baseline transthoracic echocardiogram was performed. The patient's abdomen and both groins were prepped and draped in a sterile manner. A time out procedure was performed.   PERIPHERAL ACCESS:    Using the modified Seldinger technique, femoral arterial and venous access was obtained with placement of 6 Fr sheaths on the Right side.  A pigtail diagnostic catheter was passed through the Right arterial sheath under fluoroscopic guidance into the aortic root.  A temporary transvenous pacemaker catheter was passed through the Right femoral venous sheath under fluoroscopic guidance into the right ventricle.  The pacemaker was tested to ensure stable lead placement and pacemaker capture. Aortic root angiography was performed in order to determine the optimal angiographic angle for valve deployment.   TRANSFEMORAL ACCESS:   Percutaneous transfemoral access and sheath placement was performed using ultrasound guidance.  The left common femoral artery was cannulated using a micropuncture needle and appropriate location was verified using hand injection angiogram.  A pair of Abbott Perclose percutaneous closure devices were placed and a 6 French sheath replaced  into the femoral artery.  The patient was heparinized systemically and ACT verified > 250 seconds.     A 39F Fr transfemoral E-sheath was introduced into the left common femoral artery after progressively dilating over an Amplatz superstiff wire. An AL4 catheter was used to direct a straight-tip exchange length wire across the native aortic valve into the left ventricle. This was exchanged out for a pigtail catheter and position was confirmed in the LV apex.  The pigtail catheter was exchanged for a Safari wire in the LV apex.   BALLOON AORTIC VALVULOPLASTY:   Balloon aortic valvuloplasty was performed using a 20 mm valvuloplasty balloon.  Once optimal position was achieved, BAV was done under rapid ventricular pacing. The patient recovered well hemodynamically.    TRANSCATHETER HEART VALVE DEPLOYMENT:   An Edwards Sapien 3 Ultra transcatheter heart valve (size 29 mm) was prepared and crimped per manufacturer's guidelines, and the proper orientation of the valve is confirmed on the [employer] delivery system. The valve was advanced through the introducer sheath using normal technique until in an appropriate position in the abdominal aorta beyond the sheath tip. The balloon was then retracted and using the fine-tuning wheel was centered on the valve. The valve was then advanced across the aortic arch using appropriate flexion of the catheter. The valve was carefully positioned across the aortic valve annulus. The Commander catheter was retracted using normal technique. Once final position of the valve has been confirmed by angiographic assessment, the valve is deployed during rapid ventricular pacing to maintain systolic blood pressure < 50 mmHg and pulse pressure < 10 mmHg. The balloon inflation is held for >3 seconds after reaching full deployment volume. Once the balloon has fully deflated the balloon is retracted into the ascending aorta and valve function is assessed using echocardiography. There is felt to be no paravalvular leak and no central aortic insufficiency.  The patient's hemodynamic  recovery following valve deployment is good.  The deployment balloon and guidewire are both removed.    PROCEDURE COMPLETION:   The sheath was removed and femoral artery closure performed.  Protamine was administered once femoral arterial repair was complete. The temporary pacemaker, pigtail catheter and femoral sheaths were removed with manual pressure used for venous hemostasis.  A Mynx femoral closure device was utilized following removal of the diagnostic sheath in the Right femoral artery.  The patient tolerated the procedure well and is transported to the cath lab recovery area in stable condition. There were no immediate intraoperative complications. All sponge instrument and needle counts are verified correct at completion of the operation.   No blood products were administered during the operation.     Coralie Common, MD 02/28/2022 1:46 PM

## 2022-02-28 NOTE — Transfer of Care (Signed)
Immediate Anesthesia Transfer of Care Note  Patient: Devin Buckley  Procedure(s) Performed: Transcatheter Aortic Valve Replacement, Transfemoral using a 29 MM Edwards SAPIEN 3 Ultra. (Chest) INTRAOPERATIVE TRANSTHORACIC ECHOCARDIOGRAM  Patient Location: Cath Lab  Anesthesia Type:MAC  Level of Consciousness: drowsy and patient cooperative  Airway & Oxygen Therapy: Patient Spontanous Breathing and Patient connected to face mask oxygen  Post-op Assessment: Report given to RN and Post -op Vital signs reviewed and stable  Post vital signs: Reviewed and stable  Last Vitals:  Vitals Value Taken Time  BP 115/57 02/28/22 1401  Temp    Pulse 54 02/28/22 1404  Resp 15 02/28/22 1404  SpO2 98 % 02/28/22 1404  Vitals shown include unvalidated device data.  Last Pain:  Vitals:   02/28/22 0924  TempSrc:   PainSc: 0-No pain         Complications: No notable events documented.

## 2022-02-28 NOTE — Anesthesia Preprocedure Evaluation (Signed)
Anesthesia Evaluation  Patient identified by MRN, date of birth, ID band Patient awake    Reviewed: Allergy & Precautions, H&P , NPO status , Patient's Chart, lab work & pertinent test results  Airway Mallampati: II   Neck ROM: full    Dental   Pulmonary neg pulmonary ROS,    breath sounds clear to auscultation       Cardiovascular + dysrhythmias Atrial Fibrillation + Valvular Problems/Murmurs AS  Rhythm:regular Rate:Normal  EF 55%, severe AS, dilated LA and RA.   Neuro/Psych PSYCHIATRIC DISORDERS Anxiety    GI/Hepatic   Endo/Other    Renal/GU stones     Musculoskeletal  (+) Arthritis ,   Abdominal   Peds  Hematology   Anesthesia Other Findings   Reproductive/Obstetrics                             Anesthesia Physical Anesthesia Plan  ASA: 3  Anesthesia Plan: MAC   Post-op Pain Management:    Induction: Intravenous  PONV Risk Score and Plan: 1 and Propofol infusion, Treatment may vary due to age or medical condition and Ondansetron  Airway Management Planned: Simple Face Mask  Additional Equipment:   Intra-op Plan:   Post-operative Plan:   Informed Consent: I have reviewed the patients History and Physical, chart, labs and discussed the procedure including the risks, benefits and alternatives for the proposed anesthesia with the patient or authorized representative who has indicated his/her understanding and acceptance.     Dental advisory given  Plan Discussed with: CRNA, Anesthesiologist and Surgeon  Anesthesia Plan Comments:         Anesthesia Quick Evaluation

## 2022-02-28 NOTE — Progress Notes (Signed)
  Echocardiogram 2D Echocardiogram has been performed.  Devin Buckley 02/28/2022, 1:34 PM

## 2022-02-28 NOTE — CV Procedure (Signed)
[facility]  TAVR OPERATIVE NOTE   Date of Procedure:  02/28/2022  Preoperative Diagnosis: Severe Aortic Stenosis   Postoperative Diagnosis: Same   Procedure:   Transcatheter Aortic Valve Replacement - Transfemoral Approach  Edwards Sapien 3 THV (size 29 mm, model #[id number] , serial # [id number])   Co-Surgeons:  Lauree Chandler, MD and Gaye Pollack, MD/ Coralie Common, MD  Anesthesiologist:  Hodierne  Echocardiographer:  O'Neal  Pre-operative Echo Findings: Severe aortic stenosis Normal left ventricular systolic function  Post-operative Echo Findings: No paravalvular leak Normal left ventricular systolic function  BRIEF CLINICAL NOTE AND INDICATIONS FOR SURGERY  81 yo male with history of CAD, anxiety, aortic atherosclerosis, permanent atrial fibrillation, gout, hyperlipidemia and severe aortic stenosis who is here today for TAVR. He has been followed for severe aortic stenosis for several years. Echo June 2023 with LVEF=50-55%. Normal RV function. Trivial MR. The aortic valve is thickened and calcified with poor leaflet excursion, mean gradient 63 mmHg, peak gradient 93.7 mmHg, AVA 0.51 cm2, DI 0.13, SVI 28. This is consistent with severe/critical AS. Cardiac cath 02/03/22 with mild CAD. Moderate pulmonary HTN. Cardiac CT 02/08/22 with anatomy suitable for a 29 mm Edwards Sapien 3 THV.   During the course of the patient's preoperative work up they have been evaluated comprehensively by a multidisciplinary team of specialists coordinated through the [facility] in the [facility].  They have been demonstrated to suffer from symptomatic severe aortic stenosis as noted above. The patient has been counseled extensively as to the relative risks and benefits of all options for the treatment of severe aortic stenosis including long term medical therapy, conventional surgery for aortic valve replacement, and transcatheter  aortic valve replacement.  The patient has been independently evaluated by Dr. Cyndia Bent with CT surgery and they are felt to be at high risk for conventional surgical aortic valve replacement. The surgeon indicated the patient would be a poor candidate for conventional surgery. Based upon review of all of the patient's preoperative diagnostic tests they are felt to be candidate for transcatheter aortic valve replacement using the transfemoral approach as an alternative to high risk conventional surgery.    Following the decision to proceed with transcatheter aortic valve replacement, a discussion has been held regarding what types of management strategies would be attempted intraoperatively in the event of life-threatening complications, including whether or not the patient would be considered a candidate for the use of cardiopulmonary bypass and/or conversion to open sternotomy for attempted surgical intervention.  The patient has been advised of a variety of complications that might develop peculiar to this approach including but not limited to risks of death, stroke, paravalvular leak, aortic dissection or other major vascular complications, aortic annulus rupture, device embolization, cardiac rupture or perforation, acute myocardial infarction, arrhythmia, heart block or bradycardia requiring permanent pacemaker placement, congestive heart failure, respiratory failure, renal failure, pneumonia, infection, other late complications related to structural valve deterioration or migration, or other complications that might ultimately cause a temporary or permanent loss of functional independence or other long term morbidity.  The patient provides full informed consent for the procedure as described and all questions were answered preoperatively.    DETAILS OF THE OPERATIVE PROCEDURE  PREPARATION:   The patient is brought to the operating room on the above mentioned date and central monitoring was established  by the anesthesia team including placement of a radial arterial line. The patient is placed in the supine position  on the operating table.  Intravenous antibiotics are administered. Conscious sedation is used.   Baseline transthoracic echocardiogram was performed. The patient's chest, abdomen, both groins, and both lower extremities are prepared and draped in a sterile manner. A time out procedure is performed.   PERIPHERAL ACCESS:   Using the modified Seldinger technique, femoral arterial and venous access were obtained with placement of a 6 Fr sheath in the artery and a 7 Fr sheath in the vein on the right side using u/s guidance.  A pigtail diagnostic catheter was passed through the femoral arterial sheath under fluoroscopic guidance into the aortic root.  A temporary transvenous pacemaker catheter was passed through the femoral venous sheath under fluoroscopic guidance into the right ventricle.  The pacemaker was tested to ensure stable lead placement and pacemaker capture. Aortic root angiography was performed in order to determine the optimal angiographic angle for valve deployment.  TRANSFEMORAL ACCESS:  A micropuncture kit was used to gain access to the left femoral artery using u/s guidance. Position confirmed with angiography. Pre-closure with double ProGlide closure devices. The patient was heparinized systemically and ACT verified > 250 seconds.    A 16 Fr transfemoral E-sheath was introduced into the left femoral artery after progressively dilating over an Amplatz superstiff wire. An AL-1 catheter was used to direct a straight-tip exchange length wire across the native aortic valve into the left ventricle. This was exchanged out for a pigtail catheter and position was confirmed in the LV apex. No LV pressures recorded.  The pigtail catheter was then exchanged for an Amplatz Extra-stiff wire in the LV apex.   TRANSCATHETER HEART VALVE DEPLOYMENT:  An Edwards Sapien 3 THV (size 29 mm) was  prepared and crimped per manufacturer's guidelines, and the proper orientation of the valve is confirmed on the [employer] delivery system. The valve was advanced through the introducer sheath using normal technique until in an appropriate position in the abdominal aorta beyond the sheath tip. The balloon was then retracted and using the fine-tuning wheel was centered on the valve. The valve was then advanced across the aortic arch using appropriate flexion of the catheter. The valve was carefully positioned across the aortic valve annulus. The Commander catheter was retracted using normal technique. Once final position of the valve has been confirmed by angiographic assessment, the valve is deployed while temporarily holding ventilation and during rapid ventricular pacing to maintain systolic blood pressure < 50 mmHg and pulse pressure < 10 mmHg. The balloon inflation is held for >3 seconds after reaching full deployment volume. Once the balloon has fully deflated the balloon is retracted into the ascending aorta and valve function is assessed using TTE. There is felt to be no paravalvular leak and no central aortic insufficiency.  The patient's hemodynamic recovery following valve deployment is good.  The deployment balloon and guidewire are both removed. Echo demostrated acceptable post-procedural gradients, stable mitral valve function, and no AI.   PROCEDURE COMPLETION:  The sheath was then removed and closure devices were completed. Protamine was administered once femoral arterial repair was complete. The temporary pacemaker, pigtail catheters and femoral sheaths were removed with a Mynx closure device placed in the artery and manual pressure used for venous hemostasis.    The patient tolerated the procedure well and is transported to the surgical intensive care in stable condition. There were no immediate intraoperative complications. All sponge instrument and needle counts are verified correct at  completion of the operation.   No blood products were  administered during the operation.  The patient received a total of 40 mL of intravenous contrast during the procedure.  LVEDP: Not measured  Lauree Chandler MD 02/28/2022 1:54 PM

## 2022-02-28 NOTE — Anesthesia Procedure Notes (Signed)
Arterial Line Insertion Start/End9/05/2022 9:15 AM, 02/28/2022 9:25 AM Performed by: Reece Agar, CRNA, CRNA  Patient location: Pre-op. Preanesthetic checklist: patient identified, IV checked, site marked, risks and benefits discussed, surgical consent, monitors and equipment checked, pre-op evaluation, timeout performed and anesthesia consent Lidocaine 1% used for infiltration Right, radial was placed Catheter size: 20 G Hand hygiene performed , maximum sterile barriers used  and Seldinger technique used Allen's test indicative of satisfactory collateral circulation Attempts: 1 Procedure performed without using ultrasound guided technique. Following insertion, dressing applied and Biopatch. Post procedure assessment: normal and unchanged  Patient tolerated the procedure well with no immediate complications.

## 2022-02-28 NOTE — Progress Notes (Signed)
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

## 2022-02-28 NOTE — Interval H&P Note (Signed)
History and Physical Interval Note:  02/28/2022 10:23 AM  Devin Buckley  has presented today for surgery, with the diagnosis of Severe Aortic Stenosis.  The various methods of treatment have been discussed with the patient and family. After consideration of risks, benefits and other options for treatment, the patient has consented to  Procedure(s): Transcatheter Aortic Valve Replacement, Transfemoral (N/A) INTRAOPERATIVE TRANSTHORACIC ECHOCARDIOGRAM (N/A) as a surgical intervention.  The patient's history has been reviewed, patient examined, no change in status, stable for surgery.  I have reviewed the patient's chart and labs.  Questions were answered to the patient's satisfaction.     Gaye Pollack

## 2022-03-01 ENCOUNTER — Encounter (HOSPITAL_COMMUNITY): Payer: Self-pay | Admitting: Cardiovascular Disease

## 2022-03-01 ENCOUNTER — Inpatient Hospital Stay (HOSPITAL_COMMUNITY): Payer: Medicare Other

## 2022-03-01 DIAGNOSIS — I350 Nonrheumatic aortic (valve) stenosis: Secondary | ICD-10-CM

## 2022-03-01 DIAGNOSIS — E876 Hypokalemia: Secondary | ICD-10-CM

## 2022-03-01 DIAGNOSIS — Z006 Encounter for examination for normal comparison and control in clinical research program: Secondary | ICD-10-CM

## 2022-03-01 LAB — CBC
HCT: 39.7 % (ref 39.0–52.0)
Hemoglobin: 13.1 g/dL (ref 13.0–17.0)
MCH: 32.5 pg (ref 26.0–34.0)
MCHC: 33 g/dL (ref 30.0–36.0)
MCV: 98.5 fL (ref 80.0–100.0)
Platelets: 110 10*3/uL — ABNORMAL LOW (ref 150–400)
RBC: 4.03 MIL/uL — ABNORMAL LOW (ref 4.22–5.81)
RDW: 14 % (ref 11.5–15.5)
WBC: 6.5 10*3/uL (ref 4.0–10.5)
nRBC: 0 % (ref 0.0–0.2)

## 2022-03-01 LAB — BASIC METABOLIC PANEL
Anion gap: 8 (ref 5–15)
BUN: 16 mg/dL (ref 8–23)
CO2: 31 mmol/L (ref 22–32)
Calcium: 7.7 mg/dL — ABNORMAL LOW (ref 8.9–10.3)
Chloride: 103 mmol/L (ref 98–111)
Creatinine, Ser: 0.84 mg/dL (ref 0.61–1.24)
GFR, Estimated: >60 mL/min (ref >60)
Glucose, Bld: 121 mg/dL — ABNORMAL HIGH (ref 70–99)
Potassium: 3.3 mmol/L — ABNORMAL LOW (ref 3.5–5.1)
Sodium: 142 mmol/L (ref 135–145)

## 2022-03-01 LAB — MAGNESIUM: Magnesium: 1.6 mg/dL — ABNORMAL LOW (ref 1.7–2.4)

## 2022-03-01 MED ORDER — POTASSIUM CHLORIDE CRYS ER 20 MEQ PO TBCR
40.0000 meq | EXTENDED_RELEASE_TABLET | Freq: Once | ORAL | Status: AC
  Administered 2022-03-01: 40 meq via ORAL
  Filled 2022-03-01: qty 2

## 2022-03-01 MED ORDER — ORAL CARE MOUTH RINSE: 15.0000 mL | OROMUCOSAL | Status: DC | PRN

## 2022-03-01 NOTE — Progress Notes (Signed)
CARDIAC REHAB PHASE I   PRE:  Rate/Rhythm: Afib/70    BP: sitting 103/50    SaO2: 94% RA  MODE:  Ambulation: 740 ft   POST:  Rate/Rhythm: Afib/80    BP: sitting 123/54    SaO2: 99% RA  Patient ambulated 760f, tolerated well, denies sob, dizziness, vital signs remained stable. Patient and nephew educated on exercise guidelines, restrictions, [place] cardiac [place]  [id number] 03/01/2022 2:32 PM

## 2022-03-01 NOTE — Progress Notes (Signed)
  Echocardiogram 2D Echocardiogram has been performed.  Devin Buckley 03/01/2022, 9:36 AM

## 2022-03-01 NOTE — Anesthesia Postprocedure Evaluation (Signed)
Anesthesia Post Note  Patient: Devin Buckley  Procedure(s) Performed: Transcatheter Aortic Valve Replacement, Transfemoral using a 29 MM Edwards SAPIEN 3 Ultra. (Chest) INTRAOPERATIVE TRANSTHORACIC ECHOCARDIOGRAM     Patient location during evaluation: PACU Anesthesia Type: MAC Level of consciousness: awake and alert Pain management: pain level controlled Vital Signs Assessment: post-procedure vital signs reviewed and stable Respiratory status: spontaneous breathing, nonlabored ventilation, respiratory function stable and patient connected to nasal cannula oxygen Cardiovascular status: stable and blood pressure returned to baseline Postop Assessment: no apparent nausea or vomiting Anesthetic complications: no   No notable events documented.  Last Vitals:  Vitals:   03/01/22 1000 03/01/22 1100  BP:    Pulse: 86 64  Resp: 17 19  Temp:    SpO2: 95% (!) 89%    Last Pain:  Vitals:   03/01/22 0819  TempSrc: Oral  PainSc:                  [place] S

## 2022-03-01 NOTE — Progress Notes (Addendum)
[place] VALVE TEAM  Patient Name: Devin Buckley Date of Encounter: 03/01/2022  Primary Cardiologist: Larae Grooms, MD/ Dr. Angelena Form, MD & Dr. Lavonna Monarch, MD (TAVR)  [facility] Problem List     Principal Problem:   Severe aortic stenosis Active Problems:   Hyperlipemia, mixed   Atrial fibrillation, chronic (HCC)   Chronic combined systolic (congestive) and diastolic (congestive) heart failure (HCC)   HTN (hypertension)   S/P TAVR (transcatheter aortic valve replacement)   Hypokalemia   Subjective   Doing well with no complaints this AM. Remains on low dose Levophed. Plan to wean and transfer out of the unit today with anticipated d/c tomorrow.   Inpatient Medications    Scheduled Meds:  apixaban  5 mg Oral BID   buprenorphine-naloxone  1.5 tablet Sublingual Daily   Chlorhexidine Gluconate Cloth  6 each Topical Q0600   clonazePAM  0.5 mg Oral BID   potassium chloride  40 mEq Oral Once   sodium chloride flush  3 mL Intravenous Q12H   Continuous Infusions:  sodium chloride     sodium chloride Stopped (02/28/22 1806)   nitroGLYCERIN     PRN Meds: sodium chloride, acetaminophen **OR** acetaminophen, morphine injection, ondansetron (ZOFRAN) IV, mouth rinse, oxyCODONE, sodium chloride flush, traMADol   Vital Signs    Vitals:   03/01/22 0600 03/01/22 0630 03/01/22 0700 03/01/22 0819  BP: (!) 90/48 (!) 116/55 127/71   Pulse: 80 87 99   Resp: 16 15 (!) 23   Temp:    98.7 F (37.1 C)  TempSrc:    Oral  SpO2: 95% 99% 96%   Weight:      Height:        Intake/Output Summary (Last 24 hours) at 03/01/2022 0828 Last data filed at 03/01/2022 0400 Gross per 24 hour  Intake 1881.79 ml  Output 1195 ml  Net 686.79 ml   Filed Weights   02/28/22 0835 03/01/22 0530  Weight: 90.7 kg 92.1 kg   Physical Exam    General: Well developed, well nourished, NAD Neck: Negative for carotid bruits. No JVD Lungs:Clear to ausculation  bilaterally.  Cardiovascular: Irregularly irregular. Soft flow murmur Abdomen: Soft, non-tender, non-distended. No obvious abdominal masses. Extremities: No edema. Groin sites stable with no hematoma or bleeding.  Neuro: Alert and oriented. No focal deficits. No facial asymmetry. MAE spontaneously. Psych: Responds to questions appropriately with normal affect.    Labs    CBC Recent Labs    02/28/22 1420 03/01/22 0655  WBC  --  6.5  HGB 13.3 13.1  HCT 39.0 39.7  MCV  --  98.5  PLT  --  510*   Basic Metabolic Panel Recent Labs    02/28/22 1420 03/01/22 0655  NA 142 142  K 3.4* 3.3*  CL 99 103  CO2  --  31  GLUCOSE 107* 121*  BUN 23 16  CREATININE 0.80 0.84  CALCIUM  --  7.7*  MG  --  1.6*   Liver Function Tests No results for input(s): "AST", "ALT", "ALKPHOS", "BILITOT", "PROT", "ALBUMIN" in the last 72 hours. No results for input(s): "LIPASE", "AMYLASE" in the last 72 hours. Cardiac Enzymes No results for input(s): "CKTOTAL", "CKMB", "CKMBINDEX", "TROPONINI" in the last 72 hours. BNP Invalid input(s): "POCBNP" D-Dimer No results for input(s): "DDIMER" in the last 72 hours. Hemoglobin A1C No results for input(s): "HGBA1C" in the last 72 hours. Fasting Lipid Panel No results for input(s): "CHOL", "HDL", "LDLCALC", "TRIG", "  CHOLHDL", "LDLDIRECT" in the last 72 hours. Thyroid Function Tests No results for input(s): "TSH", "T4TOTAL", "T3FREE", "THYROIDAB" in the last 72 hours.  Invalid input(s): "FREET3"  Telemetry    AF with stable rates - Personally Reviewed  ECG    No new tracing as of 03/01/22 - Personally Reviewed  Radiology    ECHOCARDIOGRAM LIMITED  Result Date: 02/28/2022    ECHOCARDIOGRAM LIMITED REPORT   Patient Name:   Devin Buckley Date of Exam: 02/28/2022 Medical Rec #:  [id number]     Height:       74.0 in Accession #:    [id number]    Weight:       200.0 lb Date of Birth:  September 17, 1940      BSA:          2.173 m Patient Age:    81 years      BP:            79/51 mmHg Patient Gender: M             HR:           72 bpm. Exam Location:  Inpatient Procedure: Limited Echo, Cardiac Doppler and Color Doppler Indications:     I35.0 Nonrheumatic aortic (valve) stenosis  History:         Patient has prior history of Echocardiogram examinations. CHF,                  Abnormal ECG, Aortic Valve Disease, Arrythmias:Atrial                  Fibrillation; Risk Factors:Hypertension.                  Aortic Valve: 29 mm Sapien prosthetic, stented (TAVR) valve is                  present in the aortic position. Procedure Date: 02/28/2022.  Sonographer:     Roseanna Rainbow RDCS Referring Phys:  Canones Diagnosing Phys: Eleonore Chiquito MD IMPRESSIONS  1. Echo guided TAVR. 29 mm S3. Vmax 1.8 m/s, MG 7 mmHG, EOA 2.9 cm2, DI 0.64. No paravalvular leak or regurgitation. The aortic valve has been repaired/replaced. There is a 29 mm Sapien prosthetic (TAVR) valve present in the aortic position. Procedure Date: 02/28/2022.  2. Left ventricular ejection fraction, by estimation, is 50 to 55%. The left ventricle has low normal function. The left ventricle has no regional wall motion abnormalities. There is moderate concentric left ventricular hypertrophy.  3. Right ventricular systolic function is mildly reduced. The right ventricular size is mildly enlarged. There is severely elevated pulmonary artery systolic pressure.  4. The mitral valve is grossly normal. Trivial mitral valve regurgitation.  5. Tricuspid valve regurgitation is mild to moderate.  6. Aortic dilatation noted. Aneurysm of the ascending aorta, measuring 44 mm. FINDINGS  Left Ventricle: Left ventricular ejection fraction, by estimation, is 50 to 55%. The left ventricle has low normal function. The left ventricle has no regional wall motion abnormalities. The left ventricular internal cavity size was normal in size. There is moderate concentric left ventricular hypertrophy. Right Ventricle: The right ventricular size  is mildly enlarged. No increase in right ventricular wall thickness. Right ventricular systolic function is mildly reduced. There is severely elevated pulmonary artery systolic pressure. The tricuspid regurgitant velocity is 3.42 m/s, and with an assumed right atrial pressure of 15 mmHg, the estimated right ventricular systolic pressure is 37.1 mmHg. Pericardium: Trivial pericardial  effusion is present. Mitral Valve: The mitral valve is grossly normal. Trivial mitral valve regurgitation. Tricuspid Valve: The tricuspid valve is grossly normal. Tricuspid valve regurgitation is mild to moderate. No evidence of tricuspid stenosis. Aortic Valve: Echo guided TAVR. 29 mm S3. Vmax 1.8 m/s, MG 7 mmHG, EOA 2.9 cm2, DI 0.64. No paravalvular leak or regurgitation. The aortic valve has been repaired/replaced. Aortic valve mean gradient measures 7.0 mmHg. Aortic valve peak gradient measures  13.2 mmHg. Aortic valve area, by VTI measures 2.93 cm. There is a 29 mm Sapien prosthetic, stented (TAVR) valve present in the aortic position. Procedure Date: 02/28/2022. Aorta: Aortic dilatation noted and the aortic root is normal in size and structure. There is an aneurysm involving the ascending aorta measuring 44 mm. LEFT VENTRICLE PLAX 2D LVIDd:         5.40 cm LVIDs:         3.63 cm LV PW:         1.50 cm LV IVS:        1.50 cm LVOT diam:     2.42 cm LV SV:         106 LV SV Index:   49 LVOT Area:     4.60 cm  LV Volumes (MOD) LV vol d, MOD A2C: 177.0 ml LV vol d, MOD A4C: 149.0 ml LV vol s, MOD A2C: 76.7 ml LV vol s, MOD A4C: 72.8 ml LV SV MOD A2C:     100.3 ml LV SV MOD A4C:     149.0 ml LV SV MOD BP:      87.9 ml AORTIC VALVE AV Area (Vmax):    3.04 cm AV Area (Vmean):   2.95 cm AV Area (VTI):     2.93 cm AV Vmax:           181.50 cm/s AV Vmean:          119.000 cm/s AV VTI:            0.363 m AV Peak Grad:      13.2 mmHg AV Mean Grad:      7.0 mmHg LVOT Vmax:         119.90 cm/s LVOT Vmean:        76.350 cm/s LVOT VTI:           0.231 m LVOT/AV VTI ratio: 0.64  AORTA Ao Asc diam: 4.30 cm TRICUSPID VALVE TR Peak grad:   46.8 mmHg TR Vmax:        342.00 cm/s  SHUNTS Systemic VTI:  0.23 m Systemic Diam: 2.42 cm Eleonore Chiquito MD Electronically signed by Eleonore Chiquito MD Signature Date/Time: 02/28/2022/1:59:25 PM    Final    Structural Heart Procedure  Result Date: 02/28/2022 See surgical note for result.   Cardiac Studies   Echocardiogram 03/01/22: Pending   TAVR OPERATIVE NOTE     Date of Procedure:                02/28/2022   Preoperative Diagnosis:      Severe Aortic Stenosis    Postoperative Diagnosis:    Same    Procedure:        Transcatheter Aortic Valve Replacement - Percutaneous Left Transfemoral Approach             Edwards Sapien 3 Ultra THV (size 29 mm, model # [id number], serial # [id number])              Co-Surgeons:  Coralie Common MD and Lauree Chandler  Anesthesiologist:                  Albertha Ghee MD   Echocardiographer:              Dr Marisue Ivan   Pre-operative Echo Findings: Severe aortic stenosis Normal left ventricular systolic function   Post-operative Echo Findings: No paravalvular leak Normal left ventricular systolic function   Patient Profile     TEAGEN MCLEARY is a 81 y.o. male with a history of HTN, HLD, permanent atrial fibrillation on Eliquis, chronic combined S/D CHF and severe AS who presented to [facility] on 02/28/22 for planned TAVR.   Assessment & Plan   Severe AS: s/p successful TAVR with a 29 mm Edwards Sapien 3 THV via the TF approach on 02/28/22. Post operative echo pending. Groin sites are stable. ECG with AF and no high grade heart block. Plan to restart Eliquis today therefore no ASA. Discontinue A-line today and wean levophed. Will transfer out of ICU to 4E today with hopeful d/c tomorrow AM. SBE discussed and will be RX'ed at follow up next week. Plan to ambulate with CR today.    HTN: Hypotensive requiring pressors in the post procedure setting.  Remains on low dose Levo today. Plan to wean today, d/c A-line and transfer out of ICU. Continue to hold home Metoprolol '100mg'$  BID.   Hypokalemia: K+ noted to be 3.3 today. Replace with 38mq Kdur PO now. Follow with AM labs tomorrow.    Permanent atrial fibrillation: Restart Eliquis today. Rates stable.  Holding Metoprolol '100mg'$  BID. Likely to restart at a lower dose for hospital discharge.    Chronic combined systolic and diastolic CHF: Appears euvolemic today. Takes Lasix PRN. Plan to restart at d/c.    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 MRI/MRA assist with further characterization. Focal linear filling defect of the infrarenal abdominal aorta, differential considerations include small focal dissection or linear intraluminal plaque. Moderate right and small left pleural effusions with atelectasis. Trace abdominopelvic ascites and diffuse soft tissue anasarca. Dr. BCyndia Bentreviewed possible AV fistula with Dr. CCarlis Abbottand felt no further w/u necessary   Signed, JKathyrn Drown NP  03/01/2022, 8:28 AM  Pager [phone]  Agree with above. Pt hemodynamically was still requiring Levo overnight. Groins no issues Awaiting echo Plan as above

## 2022-03-02 LAB — BASIC METABOLIC PANEL
Anion gap: 5 (ref 5–15)
BUN: 17 mg/dL (ref 8–23)
CO2: 32 mmol/L (ref 22–32)
Calcium: 8.2 mg/dL — ABNORMAL LOW (ref 8.9–10.3)
Chloride: 101 mmol/L (ref 98–111)
Creatinine, Ser: 0.99 mg/dL (ref 0.61–1.24)
GFR, Estimated: >60 mL/min (ref >60)
Glucose, Bld: 136 mg/dL — ABNORMAL HIGH (ref 70–99)
Potassium: 4 mmol/L (ref 3.5–5.1)
Sodium: 138 mmol/L (ref 135–145)

## 2022-03-02 LAB — ECHOCARDIOGRAM COMPLETE
AR max vel: 2.61 cm2
AV Area VTI: 2.72 cm2
AV Area mean vel: 2.6 cm2
AV Mean grad: 14.8 mmHg
AV Peak grad: 26.6 mmHg
Ao pk vel: 2.58 m/s
Height: 74 in
S' Lateral: 3.2 cm
Weight: 3248.7 oz

## 2022-03-02 MED FILL — Heparin Sodium (Porcine) Inj 1000 Unit/ML: Qty: 1000 | Status: AC

## 2022-03-02 MED FILL — Magnesium Sulfate Inj 50%: INTRAMUSCULAR | Qty: 10 | Status: AC

## 2022-03-02 MED FILL — Potassium Chloride Inj 2 mEq/ML: INTRAVENOUS | Qty: 40 | Status: AC

## 2022-03-02 NOTE — Progress Notes (Signed)
Patient given discharge instructions. PIVs removed. Telemetry box removed, CCMD notified. Patient taken to vehicle in wheelchair by staff.  Sylvia Kondracki L Castor Gittleman, RN    

## 2022-03-02 NOTE — Progress Notes (Signed)
Mobility Specialist Progress Note:   03/02/22 0921  Mobility  Activity Ambulated with assistance in hallway  Level of Assistance Modified independent, requires aide device or extra time  Assistive Device None  Distance Ambulated (ft) 850 ft  Activity Response Tolerated well  $Mobility charge 1 Mobility   Pt received in bed willing to participate in mobility. No complaints of pain. Left in bed with call bell in reach and all needs met.   [facility] Surveyor, mining Chat only

## 2022-03-03 ENCOUNTER — Telehealth: Payer: Self-pay | Admitting: Physician Assistant

## 2022-03-03 NOTE — Telephone Encounter (Signed)
  [place] VALVE TEAM   Patient contacted regarding discharge from [facility] on 9/14  Patient understands to follow up with a structural heart APP on 9/20 at [place].  Patient understands discharge instructions? yes Patient understands medications and regimen? yes Patient understands to bring all medications to this visit? yes  Angelena Form PA-C  [facility]

## 2022-03-03 NOTE — Telephone Encounter (Signed)
  [facility]   MULTIDISCIPLINARY HEART VALVE TEAM   Attempted TOC call. Left VM. Will try back later.   Angelena Form PA-C  [facility]

## 2022-03-07 NOTE — Progress Notes (Unsigned)
HE[facility] [place]                                     Cardiology Office Note:    Date:  03/08/2022   ID:  KARVER FADDEN, DOB [birth year], MRN [id number]  PCP:  Rusty Aus, MD  [facility] Cardiologist:  Larae Grooms, MD / Dr. Angelena Form, MD & Dr. Cyndia Bent, MD (TAVR)  [facility] Electrophysiologist:  None   Referring MD: Rusty Aus, MD   Chief Complaint  Patient presents with   Follow-up    TOC s/p TAVR   History of Present Illness:    Devin Buckley is a 81 y.o. 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    Mr. Snelling is followed very closely by Dr. Irish Lack for his aortic stenosis. Echocardiogram from 09/2021 showed an LVEF at 50-55%, normal RV function, trivial MR with a thickened and calcified aortic valve with poor leaflet excursion, mean gradient 63 mmHg, peak gradient 93.7 mmHg, AVA 0.51 cm2, DI 0.13, SVI 28, consistent with severe/critical AS. He was set up for cardiac catheterization 02/03/22 which showed mild CAD and moderate pulmonary HTN. Pre TAVR cardiac CT performed 02/08/22 demonstrated anatomy suitable for a 29 mm Edwards Sapien 3 THV.    He was then evaluated by the multidisciplinary valve team and felt to have severe, symptomatic aortic stenosis. He ultimately underwent TAVR with a 29 mm Edwards Sapien 3 THV via the TF approach on 02/28/22. Post operative echo with stable valve placement with gradients higher than immediately post op however within normal limits. Mean gradient noted to be 14.60mHg. He was restarted on home dose Eliquis with plans for close OP follow up.   Today he is here alone and is feeling great without SOB, orthopnea, or chest pain symptoms however has significant bilateral LE edema to his thighs. He states the swelling began one day after  discharge and has been progressive since that time. He has been taking Lasix '40mg'$  BID and has been urinating well. He has compression stocking but does not wear them and watches his sodium intake. He has been walking for exercise and feels great but his legs feel very heavy.  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 McAngelena Formnd Dr. BaCyndia Bent Severe aortic stenosis     Past Surgical History:  Procedure Laterality Date   ACHILLES TENDON REPAIR Left    arthroscopic shoulder  Right    BROW LIFT Bilateral 04/08/2018   Procedure: BLEPHAROPLASTY BILATERAL LOWER LID;  Surgeon: BeBea GraffMD;  [facility] Service: [facility];  Laterality: Bilateral;   INSERTION OF MESH  04/27/2021   Procedure: INSERTION  OF MESH;  Surgeon: Herbert Pun, MD;  Location: [facility];  Service: General;;   INTRAOPERATIVE TRANSTHORACIC ECHOCARDIOGRAM N/A 02/28/2022   Procedure: INTRAOPERATIVE TRANSTHORACIC ECHOCARDIOGRAM;  Surgeon: Burnell Blanks, MD;  Location: [place];  Service: Open Heart Surgery;  Laterality: N/A;   RHYTIDECTOMY N/A 04/08/2018   Procedure: RHYTIDECTOMY;  Surgeon: Bea Graff, MD;  Location: [place];  Service: Plastics;  Laterality: N/A;   RIGHT/LEFT HEART CATH AND CORONARY ANGIOGRAPHY N/A 02/03/2022   Procedure: RIGHT/LEFT HEART CATH AND CORONARY ANGIOGRAPHY;  Surgeon: Jettie Booze, MD;  Location: [place] [facility];  Service: Cardiovascular;  Laterality: N/A;   TONSILLECTOMY     TRANSCATHETER AORTIC VALVE REPLACEMENT,  TRANSFEMORAL N/A 02/28/2022   Procedure: Transcatheter Aortic Valve Replacement, Transfemoral using a 29 MM Edwards SAPIEN 3 Ultra.;  Surgeon: Burnell Blanks, MD;  Location: [place];  Service: Open Heart Surgery;  Laterality: N/A;  Transfemoral approach    Current Medications: Current Meds  Medication Sig   amoxicillin (AMOXIL) 500 MG tablet Take 4 tablets (2,000 mg total) by mouth as directed. 1 HOUR PRIOR TO DENTAL APPOINTMENTS   apixaban (ELIQUIS) 5 MG TABS tablet Take 1 tablet (5 mg total) by mouth 2 (two) times daily.   buprenorphine-naloxone (SUBOXONE) 8-2 mg SUBL SL tablet Place 1.5 tablets under the tongue daily.   clonazePAM (KLONOPIN) 0.5 MG tablet Take 0.5 mg by mouth 2 (two) times daily.   COLLAGEN PO Take 2 [place] by mouth 3 (three) times daily.   Digestive Enzyme CAPS Take 2 capsules by mouth 2 (two) times daily.   hydrocortisone 2.5 % cream Apply 1 application topically daily as needed for rash.   Multiple Vitamin (MULTIVITAMIN WITH MINERALS) TABS tablet Take 3 tablets by mouth daily. Total minerals   potassium chloride SA (KLOR-CON M) 20 MEQ tablet Take 1 tablet (20 mEq total) by mouth daily.   Probiotic Product (PROBIOTIC PO) Take 3 capsules by mouth 3 (three) times daily. total flora   Torsemide 40 MG TABS Take 40 mg by mouth daily.   VITAMIN D-VITAMIN K PO Take 1 capsule by mouth daily.   [DISCONTINUED] furosemide (LASIX) 40 MG tablet Take 40 mg by mouth 2 (two) times daily as needed for fluid.     Allergies:   Patient has no known allergies.   Social History   Socioeconomic History   Marital status: Married    Spouse name: Not on file   Number of children: Not on file   Years of education: Not on file   Highest education level: Not on file  Occupational History   Not on file  Tobacco Use   Smoking status: Never   Smokeless tobacco: Never  Vaping Use   Vaping Use: Never used  Substance and Sexual Activity   Alcohol use: No   Drug use: No   Sexual  activity: Never  Other Topics Concern   Not on file  Social History Narrative   Not on file   Social Determinants of Health   Financial Resource Strain: Not on file  Food Insecurity: Not on file  Transportation Needs: Not on file  Physical Activity: Not on file  Stress: Not on file  Social Connections: Not on file     Family History: The patient's family history includes Alzheimer's disease in his father; CVA in his father; Colon cancer in his mother; Diabetes in his brother.  ROS:   Please see the history of present illness.    All other systems reviewed and  are negative.  EKGs/Labs/Other Studies Reviewed:    The following studies were reviewed today:  TAVR OPERATIVE NOTE     Date of Procedure:                02/28/2022   Preoperative Diagnosis:      Severe Aortic Stenosis    Postoperative Diagnosis:    Same    Procedure:        Transcatheter Aortic Valve Replacement - Percutaneous Left Transfemoral Approach             Edwards Sapien 3 Ultra THV (size 29 mm, model # 9755RSL, serial # [id number])              Co-Surgeons:                        Coralie Common MD and Lauree Chandler  Anesthesiologist:                  Albertha Ghee MD   Echocardiographer:              Dr Marisue Ivan   Pre-operative Echo Findings: Severe aortic stenosis Normal left ventricular systolic function   Post-operative Echo Findings: No paravalvular leak Normal left ventricular systolic function   Echocardiogram 03/01/22:   1. 29 mm S3. Vmax 2.6 m/s, MG 14.8 mmHG, EOA 2.72 cm2, DI 0.63. No  regurgitation or paravalvular leak. Gradients are higher than immediately  postop but within limits. Valve appear fully deployed. Would closely  monitor with 1 month echocardiogram. The  aortic valve has been repaired/replaced. Aortic valve regurgitation is not  visualized. There is a 29 mm Edwards Ultra, stented (TAVR) valve present  in the aortic position. Procedure Date: 02/28/22.   2. Left  ventricular ejection fraction, by estimation, is 60 to 65%. The  left ventricle has normal function. The left ventricle has no regional  wall motion abnormalities. Left ventricular diastolic function could not  be evaluated. There is the  interventricular septum is flattened in systole and diastole, consistent  with right ventricular pressure and volume overload.   3. Right ventricular systolic function is mildly reduced. The right  ventricular size is moderately enlarged. There is severely elevated  pulmonary artery systolic pressure. The estimated right ventricular  systolic pressure is 40.8 mmHg.   4. Left atrial size was severely dilated.   5. Right atrial size was severely dilated.   6. The mitral valve is grossly normal. Trivial mitral valve  regurgitation. No evidence of mitral stenosis.   7. Tricuspid valve regurgitation is mild to moderate.   8. There is mild dilatation of the ascending aorta, measuring 41 mm.   9. The inferior vena cava is dilated in size with <50% respiratory  variability, suggesting right atrial pressure of 15 mmHg.   EKG:  EKG is ordered today.  The ekg ordered today demonstrates AF with HR 69bpm.   Recent Labs: 02/24/2022: ALT 18 03/01/2022: Hemoglobin 13.1; Magnesium 1.6; Platelets 110 03/02/2022: BUN 17; Creatinine, Ser 0.99; Potassium 4.0; Sodium 138   Recent Lipid Panel No results found for: "CHOL", "TRIG", "HDL", "CHOLHDL", "VLDL", "LDLCALC", "LDLDIRECT"  Physical Exam:    VS:  BP 110/62   Pulse 71   Ht '6\' 2"'$  (1.88 m)   Wt 202 lb (91.6 kg)   SpO2 97%   BMI 25.94 kg/m     Wt Readings from Last 3 Encounters:  03/08/22 202 lb (91.6 kg)  03/02/22 207 lb 9.6 oz (94.2  kg)  02/24/22 202 lb 3.2 oz (91.7 kg)    General: Well developed, well nourished, NAD SLungs:Clear to ausculation bilaterally. No wheezes, rales, or rhonchi. Breathing is unlabored. Cardiovascular: Irregularly irregular. + systolic murmur Abdomen: Soft, non-tender,  non-distended Extremities: 3+ pitting edema to thighs. Neuro: Alert and oriented. No focal deficits. No facial asymmetry. MAE spontaneously. Psych: Responds to questions appropriately with normal affect.    ASSESSMENT/PLAN:    Severe AS: s/p successful TAVR with a 29 mm Edwards Sapien 3 THV via the TF approach on 02/28/22. Post operative echo with normal LVEF at 60-65%, evidence of 69m S3UR Sapien valvae with no PVL, mean gradient 163mg, peak 26.35m41m, and AVA by VTI at 2.72cm2. Groin sites remain stable. ECG with AF and no high grade heart block today. He was restarted on home dose Eliquis. Dental SBE discussed and RX'ed with Amoxicillin 2 grams prior to dental cleaning and procedures. Today he reports feeling fine with no SOB, orthopnea however has significant BLE to his thighs on exam. He has been taking Lasix '40mg'$  BID. Given that he is rather asymptomatic, will attempt to treat OP. Will stop Lasix and start torsemide '40mg'$  daily for now. I will call him Monday to assess how he is doing. Will add Kdur 84m35maily as well. I will have him come back for labs next week. If he develops HF symptoms, I have a low threshold to admit for IV diuresis. Otherwise will speak to him Monday and follow up for 1 month OV with echo.    HTN: Stable. No need for additional changes today.    Permanent atrial fibrillation on Eliquis: Rates stable in the 50-60's. No on AV blocking agents. Continue Eliquis. Tolerating well.    Chronic combined systolic and diastolic CHF: See plan above. Patient has significant BLE to thighs today however compensating well with no SOB, orthopnea, dizziness, or chest pain. He has been taking Lasix '40mg'$  BID however given edema and stable Cr will transition to torsemide '40mg'$  QD with the addition of Kdur 84me45mill call him Monday for an update. Obtain labs next week.    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 MRI/MRA assist with further characterization. Focal linear filling defect of the infrarenal abdominal aorta, differential considerations include small focal dissection or linear intraluminal plaque. Moderate right and small left pleural effusions with atelectasis. Trace abdominopelvic ascites and diffuse soft tissue anasarca. Dr. BartlCyndia Bentewed possible AV fistula with Dr. ClarkCarlis Abbottfelt no further w/u necessary  Medication Adjustments/Labs and Tests Ordered: Current medicines are reviewed at length with the patient today.  Concerns regarding medicines are outlined above.  Orders Placed This Encounter  Procedures   EKG 12-Lead   Meds ordered this encounter  Medications   amoxicillin (AMOXIL) 500 MG tablet    Sig: Take 4 tablets (2,000 mg total) by mouth as directed. 1 HOUR PRIOR TO DENTAL APPOINTMENTS    Dispense:  12 tablet    Refill:  6   Torsemide 40 MG TABS    Sig: Take 40 mg by mouth daily.    Dispense:  90 tablet    Refill:  3   potassium chloride SA (KLOR-CON M) 20 MEQ tablet    Sig: Take 1 tablet (20 mEq total) by mouth daily.    Dispense:  90 tablet    Refill:  3  Patient Instructions  Medication Instructions:  Your physician has recommended you make the following change in your medication:  STOP LASIX  START TORSEMIDE 40 MG DAILY.  START POTASSIUM 20 MEQ DAILY  START AMOXICILLIN 500 MG - TAKE 4 TABLETS (2000 MG) 1 HOUR PRIOR TO  DENTAL CLEANINGS AND PROCEDURES.  *If you need a refill on your cardiac medications before your next appointment, please call your pharmacy*   Lab Work: NONE If you have labs (blood work) drawn today and your tests are completely normal, you will receive your results only by: [place] (if you have MyChart) OR A paper copy in the mail If you have any lab test that is abnormal or we need to change your treatment, we will call  you to review the results.   Testing/Procedures: NONE   Follow-Up: At [facility], you and your health needs are our priority.  As part of our continuing mission to provide you with exceptional heart care, we have created designated Provider Care Teams.  These Care Teams include your primary Cardiologist (physician) and Advanced Practice Providers (APPs -  Physician Assistants and Nurse Practitioners) who all work together to provide you with the care you need, when you need it.  We recommend signing up for the patient portal called "MyChart".  Sign up information is provided on this After Visit Summary.  MyChart is used to connect with patients for Virtual Visits (Telemedicine).  Patients are able to view lab/test results, encounter notes, upcoming appointments, etc.  Non-urgent messages can be sent to your provider as well.   To learn more about what you can do with MyChart, go to [email or web address].    Your next appointment:   KEEP SCHEDULED FOLLOW-UP  Important Information About Sugar         Signed, Kathyrn Drown, NP  03/08/2022 1:15 PM     [facility]

## 2022-03-08 ENCOUNTER — Ambulatory Visit: Payer: Medicare Other | Attending: Cardiology | Admitting: Cardiology

## 2022-03-08 ENCOUNTER — Ambulatory Visit: Payer: Medicare Other | Admitting: Interventional Cardiology

## 2022-03-08 VITALS — BP 110/62 | HR 71 | Ht 74.0 in | Wt 202.0 lb

## 2022-03-08 DIAGNOSIS — R600 Localized edema: Secondary | ICD-10-CM | POA: Diagnosis present

## 2022-03-08 DIAGNOSIS — I5042 Chronic combined systolic (congestive) and diastolic (congestive) heart failure: Secondary | ICD-10-CM | POA: Insufficient documentation

## 2022-03-08 DIAGNOSIS — Z952 Presence of prosthetic heart valve: Secondary | ICD-10-CM | POA: Insufficient documentation

## 2022-03-08 DIAGNOSIS — I10 Essential (primary) hypertension: Secondary | ICD-10-CM | POA: Insufficient documentation

## 2022-03-08 DIAGNOSIS — I350 Nonrheumatic aortic (valve) stenosis: Secondary | ICD-10-CM | POA: Insufficient documentation

## 2022-03-08 MED ORDER — AMOXICILLIN 500 MG PO TABS: 2000.0000 mg | ORAL_TABLET | ORAL | 6 refills | Status: AC

## 2022-03-08 MED ORDER — TORSEMIDE 40 MG PO TABS: 40.0000 mg | ORAL_TABLET | Freq: Every day | ORAL | 3 refills | Status: DC

## 2022-03-08 MED ORDER — POTASSIUM CHLORIDE CRYS ER 20 MEQ PO TBCR: 20.0000 meq | EXTENDED_RELEASE_TABLET | Freq: Every day | ORAL | 3 refills | Status: DC

## 2022-03-08 NOTE — Patient Instructions (Signed)
Medication Instructions:  Your physician has recommended you make the following change in your medication:  STOP LASIX  START TORSEMIDE 40 MG DAILY.  START POTASSIUM 20 MEQ DAILY  START AMOXICILLIN 500 MG - TAKE 4 TABLETS (2000 MG) 1 HOUR PRIOR TO  DENTAL CLEANINGS AND PROCEDURES.  *If you need a refill on your cardiac medications before your next appointment, please call your pharmacy*   Lab Work: NONE If you have labs (blood work) drawn today and your tests are completely normal, you will receive your results only by: [place] (if you have MyChart) OR A paper copy in the mail If you have any lab test that is abnormal or we need to change your treatment, we will call you to review the results.   Testing/Procedures: NONE   Follow-Up: At [facility], you and your health needs are our priority.  As part of our continuing mission to provide you with exceptional heart care, we have created designated Provider Care Teams.  These Care Teams include your primary Cardiologist (physician) and Advanced Practice Providers (APPs -  Physician Assistants and Nurse Practitioners) who all work together to provide you with the care you need, when you need it.  We recommend signing up for the patient portal called "MyChart".  Sign up information is provided on this After Visit Summary.  MyChart is used to connect with patients for Virtual Visits (Telemedicine).  Patients are able to view lab/test results, encounter notes, upcoming appointments, etc.  Non-urgent messages can be sent to your provider as well.   To learn more about what you can do with MyChart, go to [email or web address].    Your next appointment:   KEEP SCHEDULED FOLLOW-UP  Important Information About Sugar

## 2022-03-10 ENCOUNTER — Other Ambulatory Visit (HOSPITAL_COMMUNITY): Payer: Medicare Other

## 2022-03-14 ENCOUNTER — Telehealth (HOSPITAL_COMMUNITY): Payer: Self-pay

## 2022-03-14 NOTE — Telephone Encounter (Signed)
Pt insurance is active and benefits verified through Medicare A/B. Co-pay $0.00, DED $226.00/$226.00 met, out of pocket $0.00/$0.00 met, co-insurance 20%. No pre-authorization required. Passport, 03/14/22 @ 10:11AM, BFM#[id number]   2ndary insurance is active and benefits verified through [employer]. Co-pay $0.00, DED $0.00/$0.00 met, out of pocket $0.00/$0.00 met, co-insurance 0%. No pre-authorization required. Passport, 03/14/22 @ 10:14AM, REF#[id number]    How many CR sessions are covered? (36 sessions for TCR, 72 sessions for ICR)72 Is this a lifetime maximum or an annual maximum? No Has the member used any of these services to date? No Is there a time limit (weeks/months) on start of program and/or program completion? No     Will contact patient to see if he is interested in the Cardiac Rehab Program. If interested, patient will need to complete follow up appt. Once completed, patient will be contacted for scheduling upon review by the RN Navigator.        No records available 0 pending notifications will be sent on accept Cancel Pending Notifications  The communication was added successfully.       One or more fields in this section have invalid data and information shown below may be stale. Reopen this section to resolve this issue.      Restore Close Cancel Previous

## 2022-03-14 NOTE — Telephone Encounter (Signed)
Attempted to call patient in regards to Cardiac Rehab - LM on VM 

## 2022-03-27 ENCOUNTER — Other Ambulatory Visit: Payer: Self-pay | Admitting: Internal Medicine

## 2022-03-27 DIAGNOSIS — R634 Abnormal weight loss: Secondary | ICD-10-CM

## 2022-03-27 DIAGNOSIS — R1084 Generalized abdominal pain: Secondary | ICD-10-CM

## 2022-03-27 DIAGNOSIS — I482 Chronic atrial fibrillation, unspecified: Secondary | ICD-10-CM

## 2022-04-03 ENCOUNTER — Ambulatory Visit: Payer: Medicare Other | Attending: Cardiovascular Disease | Admitting: Cardiology

## 2022-04-03 ENCOUNTER — Ambulatory Visit (HOSPITAL_BASED_OUTPATIENT_CLINIC_OR_DEPARTMENT_OTHER): Payer: Medicare Other

## 2022-04-03 ENCOUNTER — Other Ambulatory Visit: Payer: Self-pay | Admitting: Cardiology

## 2022-04-03 VITALS — BP 122/64 | HR 65 | Ht 74.0 in | Wt 180.4 lb

## 2022-04-03 DIAGNOSIS — I10 Essential (primary) hypertension: Secondary | ICD-10-CM | POA: Insufficient documentation

## 2022-04-03 DIAGNOSIS — Z7901 Long term (current) use of anticoagulants: Secondary | ICD-10-CM | POA: Insufficient documentation

## 2022-04-03 DIAGNOSIS — I482 Chronic atrial fibrillation, unspecified: Secondary | ICD-10-CM | POA: Insufficient documentation

## 2022-04-03 DIAGNOSIS — I5042 Chronic combined systolic (congestive) and diastolic (congestive) heart failure: Secondary | ICD-10-CM | POA: Insufficient documentation

## 2022-04-03 DIAGNOSIS — Z952 Presence of prosthetic heart valve: Secondary | ICD-10-CM | POA: Diagnosis present

## 2022-04-03 DIAGNOSIS — I350 Nonrheumatic aortic (valve) stenosis: Secondary | ICD-10-CM | POA: Diagnosis present

## 2022-04-03 LAB — ECHOCARDIOGRAM COMPLETE
AR max vel: 2.56 cm2
AV Area VTI: 2.51 cm2
AV Area mean vel: 2.54 cm2
AV Mean grad: 15.6 mmHg
AV Peak grad: 29 mmHg
Ao pk vel: 2.69 m/s
Area-P 1/2: 2.85 cm2
S' Lateral: 4.1 cm

## 2022-04-03 NOTE — Progress Notes (Unsigned)
[facility] [place]                                     Cardiology Office Note:    Date:  04/04/2022   ID:  Devin Buckley, DOB 1940/09/01, MRN [id number]  PCP:  Rusty Aus, MD  [facility] Cardiologist:  Larae Grooms, MD  [facility] Electrophysiologist:  None   Referring MD: Rusty Aus, MD   Chief Complaint  Patient presents with   Follow-up    1 month s/p TAVR   History of Present Illness:    Devin Buckley is a 81 y.o. 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    Devin Buckley is followed very closely by Dr. Irish Lack for his aortic stenosis. Echocardiogram from 09/2021 showed an LVEF at 50-55%, normal RV function, trivial MR with a thickened and calcified aortic valve with poor leaflet excursion, mean gradient 63 mmHg, peak gradient 93.7 mmHg, AVA 0.51 cm2, DI 0.13, SVI 28, consistent with severe/critical AS. He was set up for cardiac catheterization 02/03/22 which showed mild CAD and moderate pulmonary HTN. Pre TAVR cardiac CT performed 02/08/22 demonstrated anatomy suitable for a 29 mm Edwards Sapien 3 THV.    He was then evaluated by the multidisciplinary valve team and felt to have severe, symptomatic aortic stenosis. He ultimately underwent TAVR with a 29 mm Edwards Sapien 3 THV via the TF approach on 02/28/22. Post operative echo with stable valve placement with gradients higher than immediately post op however within normal limits. Mean gradient noted to be 14.39mHg. He was restarted on home dose Eliquis with plans for close OP follow up.    I saw him in follow up and he was doing well however had issues with significant bilateral LE edema to his thighs. He reported that the swelling began one day after discharge and progressed since that time. He was previously taking Lasix '40mg'$   BID. Given stable Cr we transitioned him to Torsemide '40mg'$  QD with K+ supplementation.   Today he is here alone and continues to do very well from a valve standpoint. His edema has improved drastically. He just received his thigh high compression stockings and has been wearing them the last several days. He denies SOB, palpitations, orthopnea, bleeding in stool or urine, dizziness, or syncope.    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 McAngelena Formnd Dr. BaCyndia Bent Severe aortic stenosis     Past Surgical History:  Procedure Laterality Date   ACHILLES TENDON REPAIR Left    arthroscopic shoulder  Right    BROW LIFT Bilateral 04/08/2018   Procedure: BLEPHAROPLASTY BILATERAL LOWER LID;  Surgeon: BeBea GraffMD;  Location: [place];  Service: Plastics;  Laterality: Bilateral;   INSERTION OF MESH  04/27/2021   Procedure: INSERTION OF MESH;  Surgeon: Herbert Pun, MD;  Location: [facility];  Service: General;;   INTRAOPERATIVE TRANSTHORACIC ECHOCARDIOGRAM N/A 02/28/2022   Procedure: INTRAOPERATIVE TRANSTHORACIC ECHOCARDIOGRAM;  Surgeon: Burnell Blanks, MD;  Location: [place];  Service: Open Heart Surgery;  Laterality: N/A;   RHYTIDECTOMY N/A 04/08/2018   Procedure: RHYTIDECTOMY;  Surgeon: Bea Graff, MD;  Location: [place];  Service: Plastics;  Laterality: N/A;   RIGHT/LEFT HEART CATH AND CORONARY ANGIOGRAPHY N/A 02/03/2022   Procedure: RIGHT/LEFT HEART CATH AND CORONARY ANGIOGRAPHY;  Surgeon: Jettie Booze, MD;  Location: [place] [facility];  Service: Cardiovascular;  Laterality: N/A;   TONSILLECTOMY     TRANSCATHETER AORTIC VALVE REPLACEMENT, TRANSFEMORAL N/A 02/28/2022   Procedure: Transcatheter Aortic Valve Replacement, Transfemoral using a 29 MM Edwards SAPIEN 3 Ultra.;  Surgeon: Burnell Blanks, MD;  Location: [place];  Service: Open Heart Surgery;  Laterality: N/A;  Transfemoral approach    Current Medications: Current Meds  Medication Sig   amoxicillin (AMOXIL) 500 MG tablet Take 4 tablets (2,000 mg total) by mouth as directed. 1 HOUR PRIOR TO DENTAL APPOINTMENTS   apixaban (ELIQUIS) 5 MG TABS tablet Take 1 tablet (5 mg total) by mouth 2 (two) times daily.   buprenorphine-naloxone (SUBOXONE) 8-2 mg SUBL SL tablet Place 1.5 tablets under the tongue daily.   clonazePAM (KLONOPIN) 0.5 MG tablet Take 0.5 mg by mouth 2 (two) times daily.   COLLAGEN PO Take 2 Scoops by mouth 3 (three) times daily.   Digestive Enzyme CAPS Take 2 capsules by mouth 2 (two) times daily.   hydrocortisone 2.5 % cream Apply 1 application topically daily as needed for rash.   Multiple Vitamin (MULTIVITAMIN WITH MINERALS) TABS tablet Take 3 tablets by mouth daily. Total minerals   potassium chloride SA (KLOR-CON M) 20 MEQ tablet Take 1 tablet (20 mEq total) by mouth daily.   Probiotic Product (PROBIOTIC PO) Take 3 capsules by mouth 3 (three) times daily. total flora   Torsemide 40 MG TABS Take 40 mg by mouth daily.   VITAMIN D-VITAMIN K PO Take 1 capsule by mouth daily.     Allergies:   Patient has no known allergies.   Social History   Socioeconomic History   Marital status: Married    Spouse name: Not on file   Number of children: Not on file   Years of education: Not on file   Highest education level: Not on file  Occupational History   Not on file  Tobacco Use   Smoking status: Never   Smokeless tobacco: Never  Vaping Use   Vaping Use: Never used  Substance and Sexual Activity   Alcohol use: No   Drug use: No    Sexual activity: Never  Other Topics Concern   Not on file  Social History Narrative   Not on file   Social Determinants of Health   Financial Resource Strain: Not on file  Food Insecurity: Not on file  Transportation Needs: Not on file  Physical Activity: Not on file  Stress: Not on file  Social Connections: Not on file     Family History: The patient's family history includes Alzheimer's disease in his father; CVA in his father; Colon cancer in his mother; Diabetes in his brother.  ROS:   Please see the history of present illness.    All other systems reviewed and are  negative.  EKGs/Labs/Other Studies Reviewed:    The following studies were reviewed today:  Echocardiogram 04/03/22:   1. Left ventricular ejection fraction, by estimation, is 60 to 65%. The  left ventricle has normal function. The left ventricle has no regional  wall motion abnormalities. Left ventricular diastolic parameters are  consistent with Grade II diastolic  dysfunction (pseudonormalization). Elevated left ventricular end-diastolic  pressure.   2. Right ventricular systolic function is normal. The right ventricular  size is normal.   3. Left atrial size was severely dilated.   4. Right atrial size was severely dilated.   5. The mitral valve is normal in structure. Trivial mitral valve  regurgitation. No evidence of mitral stenosis.   6. Post TAVR with 29 mm Sapien 3 valve No PVL mean gradient 15.6 peak 29  mmHg AVA 2.5 cm2 with DVI 0.38 Gradients have increased since immediate  post implant but still in normal range Suggest f/u echo 6 months. The  aortic valve has been  repaired/replaced. Aortic valve regurgitation is not visualized. No aortic  stenosis is present.   7. Aortic dilatation noted. There is moderate dilatation of the ascending  aorta, measuring 42 mm.   8. The inferior vena cava is normal in size with greater than 50%  respiratory variability, suggesting right atrial pressure  of 3 mmHg.    TAVR OPERATIVE NOTE     Date of Procedure:                02/28/2022   Preoperative Diagnosis:      Severe Aortic Stenosis    Postoperative Diagnosis:    Same    Procedure:        Transcatheter Aortic Valve Replacement - Percutaneous Left Transfemoral Approach             Edwards Sapien 3 Ultra THV (size 29 mm, model # 9755RSL, serial # [id number])              Co-Surgeons:                        Coralie Common MD and Lauree Chandler  Anesthesiologist:                  Albertha Ghee MD   Echocardiographer:              Dr Marisue Ivan   Pre-operative Echo Findings: Severe aortic stenosis Normal left ventricular systolic function   Post-operative Echo Findings: No paravalvular leak Normal left ventricular systolic function     Echocardiogram 03/01/22:   1. 29 mm S3. Vmax 2.6 m/s, MG 14.8 mmHG, EOA 2.72 cm2, DI 0.63. No  regurgitation or paravalvular leak. Gradients are higher than immediately  postop but within limits. Valve appear fully deployed. Would closely  monitor with 1 month echocardiogram. The  aortic valve has been repaired/replaced. Aortic valve regurgitation is not  visualized. There is a 29 mm Edwards Ultra, stented (TAVR) valve present  in the aortic position. Procedure Date: 02/28/22.   2. Left ventricular ejection fraction, by estimation, is 60 to 65%. The  left ventricle has normal function. The left ventricle has no regional  wall motion abnormalities. Left ventricular diastolic function could not  be evaluated. There is the  interventricular septum is flattened in systole and diastole, consistent  with right ventricular pressure and volume overload.   3. Right ventricular systolic function is mildly reduced. The right  ventricular size is moderately enlarged. There is severely elevated  pulmonary artery systolic pressure. The estimated right ventricular  systolic pressure is 51.0 mmHg.   4. Left atrial size was severely dilated.   5. Right  atrial size was severely dilated.   6. The mitral valve is grossly normal. Trivial mitral valve  regurgitation. No evidence of mitral stenosis.   7. Tricuspid valve regurgitation is mild to moderate.   8. There is mild dilatation of the ascending aorta, measuring 41 mm.   9. The inferior vena cava is dilated in size with <50% respiratory  variability, suggesting right atrial pressure of 15 mmHg.   EKG:  EKG is not ordered today.   Recent Labs: 02/24/2022: ALT 18 03/01/2022: Magnesium 1.6 04/03/2022: BUN 20; Creatinine, Ser 0.95; Hemoglobin 14.0; Platelets 119; Potassium 4.1; Sodium 140   Recent Lipid Panel No results found for: "CHOL", "TRIG", "HDL", "CHOLHDL", "VLDL", "LDLCALC", "LDLDIRECT"  Physical Exam:    VS:  BP 122/64   Pulse 65   Ht '6\' 2"'$  (1.88 m)   Wt 180 lb 6.4 oz (81.8 kg)   SpO2 96%   BMI 23.16 kg/m     Wt Readings from Last 3 Encounters:  04/03/22 180 lb 6.4 oz (81.8 kg)  03/08/22 202 lb (91.6 kg)  03/02/22 207 lb 9.6 oz (94.2 kg)    General: Well developed, well nourished, NAD Neck: Negative for carotid bruits. No JVD Lungs:Clear to ausculation bilaterally. No wheezes, rales, or rhonchi. Breathing is unlabored. Cardiovascular: Irregularly irregular. Soft flow murmur Extremities: Mild 1+ edema.  Neuro: Alert and oriented. No focal deficits. No facial asymmetry. MAE spontaneously. Psych: Responds to questions appropriately with normal affect.    ASSESSMENT/PLAN:    Severe AS: s/p successful TAVR with a 29 mm Edwards Sapien 3 THV via the TF approach on 02/28/22. Post operative echo with normal LVEF at 60-65%, evidence of 79m S3UR Sapien valvae with no PVL, mean gradient 133mg, peak 26.18m72m, and AVA by VTI at 2.72cm2. Echo today with stable valve placement, mean gradient 15.18mm47m 29mm47mo PVL, AVA at 2.5cm2 with DVI 0.38. He has NYHA class I symptoms. Continue Eliquis. Dental SBE discussed with Amoxicillin 2 grams prior to dental cleaning and procedures.     HTN: Stable. No need for additional changes today.    Permanent atrial fibrillation on Eliquis: Rates stable in the 50-60's. No on AV blocking agents. Continue Eliquis. Tolerating well.    Chronic combined systolic and diastolic CHF: Last OV patient with significant 3-4+ bilateral edema to thighs. Lasix transitioned to Torsemide '40mg'$  with K+ supplementation. Edema improved today. Patient has been wearing compression stockings. LE elevation at rest. Labs today with stable Cr at 0.95 and K+ at 4.1.   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 MRI/MRA assist with further characterization. Focal linear filling defect of the infrarenal abdominal aorta, differential considerations include small focal dissection or linear intraluminal plaque. Moderate right and small left pleural effusions with atelectasis. Trace abdominopelvic ascites and diffuse soft tissue anasarca. Dr. BartlCyndia Bentewed possible AV fistula with Dr. ClarkCarlis Abbottfelt no further w/u necessary    Medication Adjustments/Labs and Tests Ordered: Current medicines are reviewed at length with the patient today.  Concerns regarding medicines are outlined above.  Orders Placed This Encounter  Procedures   Basic metabolic panel   CBC   ECHOCARDIOGRAM COMPLETE   No orders of the defined types were placed in  this encounter.   Patient Instructions  Medication Instructions:   Your physician recommends that you continue on your current medications as directed. Please refer to the Current Medication list given to you today.  *If you need a refill on your cardiac medications before your next appointment, please call your pharmacy*   Lab Work:  TODAY--BMET AND CBC  If you have labs (blood work) drawn today and your tests are completely normal, you will receive your  results only by: [place] (if you have MyChart) OR A paper copy in the mail If you have any lab test that is abnormal or we need to change your treatment, we will call you to review the results.   Testing/Procedures:  Your physician has requested that you have an echocardiogram. Echocardiography is a painless test that uses sound waves to create images of your heart. It provides your doctor with information about the size and shape of your heart and how well your heart's chambers and valves are working. This procedure takes approximately one hour. There are no restrictions for this procedure. SCHEDULE ECHO TO BE DONE IN ONE YEAR AND AN APPOINTMENT WITH STRUCTURAL APP SAME DAY FOR ONE YEAR S/P TAVR FOLLOW-UP  Please do NOT wear cologne, perfume, aftershave, or lotions (deodorant is allowed). Please arrive 15 minutes prior to your appointment time.    Follow-Up:  1.) 4 MONTHS WITH DR. VARANASI IN THE OFFICE  2.) ONE YEAR WITH STRUCTURAL APP WITH AN ECHO SAME DAY PER Ambrea Hegler NP    Important Information About Sugar         Signed, Kathyrn Drown, NP  04/04/2022 10:17 AM    [place]

## 2022-04-03 NOTE — Patient Instructions (Addendum)
Medication Instructions:   Your physician recommends that you continue on your current medications as directed. Please refer to the Current Medication list given to you today.  *If you need a refill on your cardiac medications before your next appointment, please call your pharmacy*   Lab Work:  TODAY--BMET AND CBC  If you have labs (blood work) drawn today and your tests are completely normal, you will receive your results only by: [employer] (if you have MyChart) OR A paper copy in the mail If you have any lab test that is abnormal or we need to change your treatment, we will call you to review the results.   Testing/Procedures:  Your physician has requested that you have an echocardiogram. Echocardiography is a painless test that uses sound waves to create images of your heart. It provides your doctor with information about the size and shape of your heart and how well your heart's chambers and valves are working. This procedure takes approximately one hour. There are no restrictions for this procedure. SCHEDULE ECHO TO BE DONE IN ONE YEAR AND AN APPOINTMENT WITH STRUCTURAL APP SAME DAY FOR ONE YEAR S/P TAVR FOLLOW-UP  Please do NOT wear cologne, perfume, aftershave, or lotions (deodorant is allowed). Please arrive 15 minutes prior to your appointment time.    Follow-Up:  1.) 4 MONTHS WITH DR. VARANASI IN THE OFFICE  2.) ONE YEAR WITH STRUCTURAL APP WITH AN ECHO SAME DAY PER JILL MCDANIEL NP    Important Information About Sugar

## 2022-04-04 ENCOUNTER — Ambulatory Visit
Admission: RE | Admit: 2022-04-04 | Discharge: 2022-04-04 | Disposition: A | Discharge status: Home / Self Care | Payer: Medicare Other | Source: Ambulatory Visit | Attending: Internal Medicine | Admitting: Internal Medicine

## 2022-04-04 DIAGNOSIS — R1084 Generalized abdominal pain: Secondary | ICD-10-CM

## 2022-04-04 DIAGNOSIS — R634 Abnormal weight loss: Secondary | ICD-10-CM

## 2022-04-04 DIAGNOSIS — I482 Chronic atrial fibrillation, unspecified: Secondary | ICD-10-CM

## 2022-04-04 LAB — BASIC METABOLIC PANEL
BUN/Creatinine Ratio: 21 (ref 10–24)
BUN: 20 mg/dL (ref 8–27)
CO2: 34 mmol/L — ABNORMAL HIGH (ref 20–29)
Calcium: 8.8 mg/dL (ref 8.6–10.2)
Chloride: 97 mmol/L (ref 96–106)
Creatinine, Ser: 0.95 mg/dL (ref 0.76–1.27)
Glucose: 117 mg/dL — ABNORMAL HIGH (ref 70–99)
Potassium: 4.1 mmol/L (ref 3.5–5.2)
Sodium: 140 mmol/L (ref 134–144)
eGFR: 81 mL/min/{1.73_m2} (ref >59)

## 2022-04-04 LAB — CBC
Hematocrit: 41 % (ref 37.5–51.0)
Hemoglobin: 14 g/dL (ref 13.0–17.7)
MCH: 32.6 pg (ref 26.6–33.0)
MCHC: 34.1 g/dL (ref 31.5–35.7)
MCV: 95 fL (ref 79–97)
Platelets: 119 10*3/uL — ABNORMAL LOW (ref 150–450)
RBC: 4.3 x10E6/uL (ref 4.14–5.80)
RDW: 12.7 % (ref 11.6–15.4)
WBC: 4.6 10*3/uL (ref 3.4–10.8)

## 2022-04-04 MED ORDER — IOPAMIDOL (ISOVUE-300) INJECTION 61%
100.0000 mL | Freq: Once | INTRAVENOUS | Status: AC | PRN
  Administered 2022-04-04: 100 mL via INTRAVENOUS

## 2022-04-13 ENCOUNTER — Other Ambulatory Visit: Payer: Self-pay | Admitting: General Surgery

## 2022-04-17 ENCOUNTER — Telehealth: Payer: Self-pay | Admitting: Interventional Cardiology

## 2022-04-17 LAB — SURGICAL PATHOLOGY

## 2022-04-17 NOTE — Telephone Encounter (Signed)
Can you please find out what this is in reference to? Thank you

## 2022-04-17 NOTE — Telephone Encounter (Signed)
Patient called to speak with the nurse. Please advise

## 2022-04-19 ENCOUNTER — Other Ambulatory Visit: Payer: Self-pay | Admitting: Cardiology

## 2022-04-19 NOTE — Telephone Encounter (Signed)
  Pt is calling back, he said, he has 5 questions on his list to ask about his TAVR and would like to speak with Kathyrn Drown

## 2022-04-19 NOTE — Telephone Encounter (Signed)
Please let the patient know that the [facility] team was calling to schedule his LE vascular duplex that was ordered by Dr. Irish Lack last year. I would forward to him if he has further questions about this.

## 2022-05-05 ENCOUNTER — Other Ambulatory Visit: Payer: Self-pay | Admitting: Internal Medicine

## 2022-05-05 ENCOUNTER — Ambulatory Visit
Admission: RE | Admit: 2022-05-05 | Discharge: 2022-05-05 | Disposition: A | Discharge status: Home / Self Care | Payer: Medicare Other | Source: Ambulatory Visit | Attending: Internal Medicine | Admitting: Internal Medicine

## 2022-05-05 DIAGNOSIS — I2609 Other pulmonary embolism with acute cor pulmonale: Secondary | ICD-10-CM | POA: Diagnosis present

## 2022-05-05 MED ORDER — IOHEXOL 350 MG/ML SOLN
75.0000 mL | Freq: Once | INTRAVENOUS | Status: AC | PRN
  Administered 2022-05-05: 75 mL via INTRAVENOUS

## 2022-05-17 ENCOUNTER — Telehealth (HOSPITAL_COMMUNITY): Payer: Self-pay

## 2022-05-17 NOTE — Telephone Encounter (Signed)
Called and spoke with pt in regards to CR, pt stated he will be in and out of the country (going into next yr). He will give CR a call when he is ready to schedule.   Closed referral

## 2022-08-09 ENCOUNTER — Ambulatory Visit: Payer: Medicare Other | Admitting: Interventional Cardiology

## 2022-08-23 NOTE — Progress Notes (Unsigned)
Cardiology Office Note   Date:  08/24/2022   ID:  Devin Buckley, DOB 08-09-1940, MRN [id number]  PCP:  Rusty Aus, MD    No chief complaint on file.  S/p AVR  Wt Readings from Last 3 Encounters:  08/24/22 196 lb 3.2 oz (89 kg)  04/03/22 180 lb 6.4 oz (81.8 kg)  03/08/22 202 lb (91.6 kg)       History of Present Illness: Devin Buckley is a 82 y.o. male who had aortic stenosis for a long period of time with symptoms of heart failure.  He did finally agree to aortic valve replacement and underwent TAVR in late 2023.  Cath in September 2023 showed: "Prox LAD to Mid LAD lesion is 25% stenosed.   Hemodynamic findings consistent with moderate pulmonary hypertension.   Aortic valve not crossed due to known severe aortic stenosis requiring valve replacement.   Nonobstructive, mild coronary artery disease.   Moderate pulmonary artery hypertension."   Leaving town in early April.  Wants to get his 6 month echo prior to leaving town in April.    Walks every other day.  Feels that exercise tolerance has improved.    Denies : Chest pain. Dizziness. Leg edema. Nitroglycerin use. Orthopnea. Palpitations. Paroxysmal nocturnal dyspnea. Shortness of breath. Syncope.    Changed his torsemide.  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 MAngelena Formand Dr. BCyndia Bent  Severe aortic stenosis     Past Surgical History:  Procedure Laterality Date   ACHILLES TENDON REPAIR Left    arthroscopic shoulder  Right    BROW LIFT Bilateral  04/08/2018   Procedure: BLEPHAROPLASTY BILATERAL LOWER LID;  Surgeon: BBea Graff MD;  [facility]  Service: Plastics;  Laterality: Bilateral;   INSERTION OF MESH  04/27/2021   Procedure: INSERTION OF MESH;  Surgeon: CHerbert Pun MD;  Location: [facility];  Service: General;;   INTRAOPERATIVE TRANSTHORACIC ECHOCARDIOGRAM N/A 02/28/2022   Procedure: INTRAOPERATIVE TRANSTHORACIC ECHOCARDIOGRAM;  Surgeon: MBurnell Blanks MD;  [place] [facility]  Service: Open Heart Surgery;  Laterality: N/A;   RHYTIDECTOMY N/A 04/08/2018   Procedure: RHYTIDECTOMY;  Surgeon: BBea Graff MD;  [place] [facility]  Service: Plastics;  Laterality: N/A;   RIGHT/LEFT HEART CATH AND CORONARY ANGIOGRAPHY N/A 02/03/2022   Procedure: RIGHT/LEFT HEART CATH AND CORONARY ANGIOGRAPHY;  Surgeon: VJettie Booze MD;  [facility];  Service: Cardiovascular;  Laterality: N/A;   TONSILLECTOMY     TRANSCATHETER AORTIC VALVE REPLACEMENT, TRANSFEMORAL N/A 02/28/2022   Procedure: Transcatheter Aortic Valve Replacement, Transfemoral using a 29 MM Edwards SAPIEN 3 Ultra.;  Surgeon: MBurnell Blanks MD;  [facility] [facility]  Service: Open Heart Surgery;  Laterality: N/A;  Transfemoral approach     Current Outpatient Medications  Medication Sig Dispense Refill   amoxicillin (AMOXIL) 500 MG tablet Take 4 tablets (2,000 mg total) by mouth as directed. 1 HOUR PRIOR TO DENTAL APPOINTMENTS 12 tablet 6  buprenorphine-naloxone (SUBOXONE) 8-2 mg SUBL SL tablet Place 2.5 tablets under the tongue daily.     clonazePAM (KLONOPIN) 0.5 MG tablet Take 0.5 mg by mouth 2 (two) times daily.     COLLAGEN PO Take 2 Scoops by mouth 3 (three) times daily.     Digestive Enzyme CAPS Take 2 capsules by mouth 2 (two) times daily.     Multiple Vitamin (MULTIVITAMIN WITH MINERALS) TABS tablet Take 3 tablets by mouth daily. Total minerals     potassium chloride SA (KLOR-CON M) 20 MEQ tablet Take 1 tablet (20 mEq total) by  mouth daily. 90 tablet 3   Probiotic Product (PROBIOTIC PO) Take 3 capsules by mouth 3 (three) times daily. total flora     Torsemide 40 MG TABS Take 40 mg by mouth daily. 90 tablet 3   VITAMIN D-VITAMIN K PO Take 1 capsule by mouth daily.     No current facility-administered medications for this visit.    Allergies:   Patient has no known allergies.    Social History:  The patient  reports that he has never smoked. He has never used smokeless tobacco. He reports that he does not drink alcohol and does not use drugs.   Family History:  The patient's family history includes Alzheimer's disease in his father; CVA in his father; Colon cancer in his mother; Diabetes in his brother.    ROS:  Please see the history of present illness.   Otherwise, review of systems are positive for skin discoloration in both lower extremities.   All other systems are reviewed and negative.    PHYSICAL EXAM: VS:  BP 110/68   Pulse 63   Ht '6\' 2"'$  (1.88 m)   Wt 196 lb 3.2 oz (89 kg)   SpO2 97%   BMI 25.19 kg/m  , BMI Body mass index is 25.19 kg/m. GEN: Well nourished, well developed, in no acute distress HEENT: normal Neck: no JVD, carotid bruits, or masses Cardiac: Irregularly irregular, normal rate; no murmurs, rubs, or gallops, trace ankle edema  Respiratory:  clear to auscultation bilaterally, normal work of breathing GI: soft, nontender, nondistended, + BS MS: no deformity or atrophy Skin: Lower extremities discolored bilaterally indicative of venous insufficiency Neuro:  Strength and sensation are intact Psych: euthymic mood, full affect    Recent Labs: 02/24/2022: ALT 18 03/01/2022: Magnesium 1.6 04/03/2022: BUN 20; Creatinine, Ser 0.95; Hemoglobin 14.0; Platelets 119; Potassium 4.1; Sodium 140   Lipid Panel No results found for: "CHOL", "TRIG", "HDL", "CHOLHDL", "VLDL", "LDLCALC", "LDLDIRECT"   Other studies Reviewed: Additional studies/ records that were reviewed today with results  demonstrating: Echo from October 2023 reviewed, creatinine 0.95 in October 2023.   ASSESSMENT AND PLAN:  Chronic systolic heart failure: Likely related to persistent aortic stenosis which was ultimately treated with TAVR. EF normalized.  Getting dental cleanings.  He appears euvolemic. Status post TAVR: Needs SBE prophylaxis.  Using torsemide when needed.  Will check electrolytes and renal function.  Lower extremity edema has improved after TAVR.  Continue to elevate legs.  He does not like the discoloration that was left behind from his long-term swelling. Atrial fibrillation: He has been rate controlled.  He did not want to take a prescription anticoagulant for a long time.  He took Eliquis intermittently but has also tried natural blood thinners.  TSH today as not done recently.  Will check today. Hyperlipidemia: Hesitant to take a statin. He wants blood work today.     Current medicines are  reviewed at length with the patient today.  The patient concerns regarding his medicines were addressed.  The following changes have been made:  No change  Labs/ tests ordered today include:  No orders of the defined types were placed in this encounter.   Recommend 150 minutes/week of aerobic exercise Low fat, low carb, high fiber diet recommended  Disposition:   FU 1 year   Signed, Larae Grooms, MD  08/24/2022 9:17 AM    [facility] [address], [place],   [address] Phone: [phone]; Fax: [phone]

## 2022-08-24 ENCOUNTER — Ambulatory Visit: Payer: Medicare Other | Attending: Interventional Cardiology | Admitting: Interventional Cardiology

## 2022-08-24 ENCOUNTER — Encounter: Payer: Self-pay | Admitting: Interventional Cardiology

## 2022-08-24 VITALS — BP 110/68 | HR 63 | Ht 74.0 in | Wt 196.2 lb

## 2022-08-24 DIAGNOSIS — Z7901 Long term (current) use of anticoagulants: Secondary | ICD-10-CM

## 2022-08-24 DIAGNOSIS — I482 Chronic atrial fibrillation, unspecified: Secondary | ICD-10-CM | POA: Diagnosis not present

## 2022-08-24 DIAGNOSIS — I5042 Chronic combined systolic (congestive) and diastolic (congestive) heart failure: Secondary | ICD-10-CM

## 2022-08-24 DIAGNOSIS — Z952 Presence of prosthetic heart valve: Secondary | ICD-10-CM

## 2022-08-24 DIAGNOSIS — E782 Mixed hyperlipidemia: Secondary | ICD-10-CM | POA: Diagnosis present

## 2022-08-24 DIAGNOSIS — I10 Essential (primary) hypertension: Secondary | ICD-10-CM | POA: Diagnosis not present

## 2022-08-24 DIAGNOSIS — R600 Localized edema: Secondary | ICD-10-CM

## 2022-08-24 NOTE — Patient Instructions (Signed)
Medication Instructions:  Your physician recommends that you continue on your current medications as directed. Please refer to the Current Medication list given to you today.  *If you need a refill on your cardiac medications before your next appointment, please call your pharmacy*   Lab Work: Lab work to be done today--CMET, TSH, CBC If you have labs (blood work) drawn today and your tests are completely normal, you will receive your results only by: [place] (if you have MyChart) OR A paper copy in the mail If you have any lab test that is abnormal or we need to change your treatment, we will call you to review the results.   Testing/Procedures: none   Follow-Up: At [facility], you and your health needs are our priority.  As part of our continuing mission to provide you with exceptional heart care, we have created designated Provider Care Teams.  These Care Teams include your primary Cardiologist (physician) and Advanced Practice Providers (APPs -  Physician Assistants and Nurse Practitioners) who all work together to provide you with the care you need, when you need it.  We recommend signing up for the patient portal called "MyChart".  Sign up information is provided on this After Visit Summary.  MyChart is used to connect with patients for Virtual Visits (Telemedicine).  Patients are able to view lab/test results, encounter notes, upcoming appointments, etc.  Non-urgent messages can be sent to your provider as well.   To learn more about what you can do with MyChart, go to [email or web address].    Your next appointment:   12 month(s)  Provider:   Larae Grooms, MD     Other Instructions

## 2022-08-25 LAB — CBC
Hematocrit: 40.8 % (ref 37.5–51.0)
Hemoglobin: 13.6 g/dL (ref 13.0–17.7)
MCH: 31.6 pg (ref 26.6–33.0)
MCHC: 33.3 g/dL (ref 31.5–35.7)
MCV: 95 fL (ref 79–97)
Platelets: 87 10*3/uL — CL (ref 150–450)
RBC: 4.3 x10E6/uL (ref 4.14–5.80)
RDW: 13.2 % (ref 11.6–15.4)
WBC: 3.9 10*3/uL (ref 3.4–10.8)

## 2022-08-25 LAB — COMPREHENSIVE METABOLIC PANEL
ALT: 21 IU/L (ref 0–44)
AST: 56 IU/L — ABNORMAL HIGH (ref 0–40)
Albumin/Globulin Ratio: 1.7 (ref 1.2–2.2)
Albumin: 3.5 g/dL — ABNORMAL LOW (ref 3.8–4.8)
Alkaline Phosphatase: 159 IU/L — ABNORMAL HIGH (ref 44–121)
BUN/Creatinine Ratio: 27 — ABNORMAL HIGH (ref 10–24)
BUN: 30 mg/dL — ABNORMAL HIGH (ref 8–27)
Bilirubin Total: 0.5 mg/dL (ref 0.0–1.2)
CO2: 31 mmol/L — ABNORMAL HIGH (ref 20–29)
Calcium: 9.1 mg/dL (ref 8.6–10.2)
Chloride: 101 mmol/L (ref 96–106)
Creatinine, Ser: 1.13 mg/dL (ref 0.76–1.27)
Globulin, Total: 2.1 g/dL (ref 1.5–4.5)
Glucose: 136 mg/dL — ABNORMAL HIGH (ref 70–99)
Potassium: 4 mmol/L (ref 3.5–5.2)
Sodium: 143 mmol/L (ref 134–144)
Total Protein: 5.6 g/dL — ABNORMAL LOW (ref 6.0–8.5)
eGFR: 66 mL/min/{1.73_m2} (ref >59)

## 2022-08-25 LAB — TSH: TSH: 2.14 u[IU]/mL (ref 0.450–4.500)

## 2022-09-06 ENCOUNTER — Telehealth: Payer: Self-pay | Admitting: Interventional Cardiology

## 2022-09-06 NOTE — Telephone Encounter (Signed)
-----   Message from Jettie Booze, MD sent at 08/25/2022  8:50 AM EST ----- Labs reviewed.  Platelets have dropped below 100.  Megan, any meds or supplements that might cause this issue?  He has taken natural blood thinners rather than Eliquis in the past so may need to investigate whether or not one of these meds can decrease platelets.   LFTs slightly increased.  Would also check on alcohol consumption as alcohol could decrease platelets and increase LFTs.

## 2022-09-06 NOTE — Telephone Encounter (Signed)
Left message to call office

## 2022-09-06 NOTE — Telephone Encounter (Signed)
Patient is requesting call back in regards to labs. Please advise.

## 2022-09-06 NOTE — Telephone Encounter (Signed)
Devin Buckley, RPH-CPP 08/25/2022 11:29 AM EST     There's some post marketing reports of thrombocytopenia with both torsemide and clonazepam but I don't think either is likely to be contributing. None of his current supplements should be contributing either. I don't see any current natural blood thinners on his list, not sure if he's taking anything else that we don't have record of?

## 2022-09-18 ENCOUNTER — Encounter: Payer: Self-pay | Admitting: *Deleted

## 2022-09-18 NOTE — Telephone Encounter (Signed)
Letter sent to patient asking him to call office to review results.

## 2022-09-18 NOTE — Telephone Encounter (Signed)
Left message to call office

## 2022-09-25 NOTE — Telephone Encounter (Signed)
Lab results reviewed with patient.  He is not taking any natural blood thinner medications. No longer taking Eliquis.  No new supplements.  He does not drink any alcohol.  Lab results routed to patient's PCP.   Will forward to Dr Eldridge Dace to update and for recommendations.

## 2022-09-28 NOTE — Telephone Encounter (Signed)
No further recs

## 2022-09-29 NOTE — Telephone Encounter (Signed)
Left message to call office

## 2022-10-05 ENCOUNTER — Telehealth: Payer: Self-pay | Admitting: *Deleted

## 2022-10-05 NOTE — Telephone Encounter (Signed)
-----   Message -----  Victorino Dike sent a message for a re appt. Pt use to come here and now the plt are low again.Dr.  Bethann Punches ref. To see Smith Robert again  . Megan--can you get him scheduled to re-establish care with Dr. Smith Robert and then let Zella Ball know the date?  Aundra Millet called twice to see if we can talk to him. No answer and no voicemail. So I told Aundra Millet to make him appt 2 weeks and then mail it to his house and write on the paper that Dr. Bethann Punches wanted you to [facility] the doctor [facility] because of  your platelet are low. The appt is 4/29//24 at 1:15

## 2022-10-05 NOTE — Telephone Encounter (Signed)
Left message to call office

## 2022-10-16 ENCOUNTER — Inpatient Hospital Stay: Payer: Medicare Other | Admitting: Oncology

## 2022-10-24 ENCOUNTER — Inpatient Hospital Stay: Payer: Medicare Other | Attending: Oncology | Admitting: Oncology

## 2022-10-24 ENCOUNTER — Encounter: Payer: Self-pay | Admitting: Oncology

## 2022-10-24 VITALS — BP 112/61 | HR 98 | Temp 97.8°F | Resp 18 | Ht 74.0 in | Wt 200.7 lb

## 2022-10-24 DIAGNOSIS — Z79899 Other long term (current) drug therapy: Secondary | ICD-10-CM | POA: Diagnosis not present

## 2022-10-24 DIAGNOSIS — D693 Immune thrombocytopenic purpura: Secondary | ICD-10-CM | POA: Diagnosis present

## 2022-10-24 DIAGNOSIS — D696 Thrombocytopenia, unspecified: Secondary | ICD-10-CM | POA: Diagnosis not present

## 2022-10-24 NOTE — Progress Notes (Signed)
Hematology/Oncology Consult note [facility]  [phone] [phone]  Patient Care Team: Danella Penton, MD as PCP - General (Internal Medicine) Corky Crafts, MD as PCP - Cardiology (Cardiology)   Name of the patient: Devin Buckley  [id number]  [birth year]   Date of visit: 10/24/22  Diagnosis-chronic ITP  Chief complaint/ Reason for visit-reestablish follow-up for ITP  Heme/Onc history: Patient is a 82 year old male who was last seen by me in October 2021 for mild isolated thrombocytopenia.  His platelet counts fluctuate between 80s to 110s.  No other associated cytopenias.  B12 folate HIV and hepatitis testing unremarkable.  Interval history-patient now referred back for his thrombocytopenia to reestablish follow-up.  He feels well overall.  He exercises regularly.  Denies any bleeding or bruising.  ECOG PS- 0 Pain scale- 0   Review of systems- Review of Systems  Constitutional:  Negative for chills, fever, malaise/fatigue and weight loss.  HENT:  Negative for congestion, ear discharge and nosebleeds.   Eyes:  Negative for blurred vision.  Respiratory:  Negative for cough, hemoptysis, sputum production, shortness of breath and wheezing.   Cardiovascular:  Negative for chest pain, palpitations, orthopnea and claudication.  Gastrointestinal:  Negative for abdominal pain, blood in stool, constipation, diarrhea, heartburn, melena, nausea and vomiting.  Genitourinary:  Negative for dysuria, flank pain, frequency, hematuria and urgency.  Musculoskeletal:  Negative for back pain, joint pain and myalgias.  Skin:  Negative for rash.  Neurological:  Negative for dizziness, tingling, focal weakness, seizures, weakness and headaches.  Endo/Heme/Allergies:  Does not bruise/bleed easily.  Psychiatric/Behavioral:  Negative for depression and suicidal ideas. The patient does not have insomnia.       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 Clifton Yani and Dr. Laneta Simmers   Severe aortic stenosis      Past Surgical History:  Procedure Laterality Date   ACHILLES TENDON REPAIR Left    arthroscopic shoulder  Right    BROW LIFT Bilateral 04/08/2018   Procedure: BLEPHAROPLASTY BILATERAL LOWER LID;  Surgeon: Aquilla Hacker, MD;  [facility];  Service: Plastics;  Laterality: Bilateral;   INSERTION OF MESH  04/27/2021   Procedure: INSERTION OF MESH;  Surgeon: Carolan Shiver, MD;  Location: [facility];  Service: General;;   INTRAOPERATIVE TRANSTHORACIC ECHOCARDIOGRAM N/A 02/28/2022   Procedure: INTRAOPERATIVE TRANSTHORACIC ECHOCARDIOGRAM;  Surgeon: Kathleene Hazel, MD;  Location: [facility];  Service: Open Heart Surgery;  Laterality: N/A;   RHYTIDECTOMY N/A 04/08/2018   Procedure: RHYTIDECTOMY;  Surgeon: Aquilla Hacker, MD;  Location: [facility];  Service: Plastics;  Laterality: N/A;   RIGHT/LEFT HEART CATH AND CORONARY ANGIOGRAPHY N/A 02/03/2022   Procedure: RIGHT/LEFT HEART CATH AND CORONARY ANGIOGRAPHY;  Surgeon: Corky Crafts, MD;  Location: [facility] INV[facility] LAB;  Service: Cardiovascular;  Laterality: N/A;   TONSILLECTOMY     TRANSCATHETER AORTIC VALVE REPLACEMENT, TRANSFEMORAL N/A 02/28/2022   Procedure: Transcatheter Aortic Valve  Replacement, Transfemoral using a 29 MM Edwards SAPIEN 3 Ultra.;  Surgeon: Kathleene Hazel, MD;  Location: [facility];  Service: Open Heart Surgery;  Laterality: N/A;  Transfemoral approach    Social History    Socioeconomic History   Marital status: Married    Spouse name: Not on file   Number of children: Not on file   Years of education: Not on file   Highest education level: Not on file  Occupational History   Not on file  Tobacco Use   Smoking status: Never   Smokeless tobacco: Never  Vaping Use   Vaping Use: Never used  Substance and Sexual Activity   Alcohol use: No   Drug use: No   Sexual activity: Never  Other Topics Concern   Not on file  Social History Narrative   Not on file   Social Determinants of Health   Financial Resource Strain: Not on file  Food Insecurity: Not on file  Transportation Needs: Not on file  Physical Activity: Not on file  Stress: Not on file  Social Connections: Not on file  Intimate Partner Violence: Not on file    Family History  Problem Relation Age of Onset   Colon cancer Mother    Alzheimer's disease Father    CVA Father    Diabetes Brother      Current Outpatient Medications:    buprenorphine-naloxone (SUBOXONE) 8-2 mg SUBL SL tablet, Place 2.5 tablets under the tongue daily., Disp: , Rfl:    clonazePAM (KLONOPIN) 0.5 MG tablet, Take 1 tablet by mouth 3 (three) times daily., Disp: , Rfl:    COLLAGEN PO, Take 2 Scoops by mouth 3 (three) times daily., Disp: , Rfl:    Digestive Enzyme CAPS, Take 2 capsules by mouth 2 (two) times daily., Disp: , Rfl:    ELIQUIS 5 MG TABS tablet, SMARTSIG:1 Tablet(s) By Mouth Every 12 Hours, Disp: , Rfl:    metoprolol succinate (TOPROL-XL) 25 MG 24 hr tablet, Take 25 mg by mouth daily., Disp: , Rfl:    Multiple Vitamin (MULTIVITAMIN WITH MINERALS) TABS tablet, Take 3 tablets by mouth daily. Total minerals, Disp: , Rfl:    potassium chloride SA (KLOR-CON M) 20 MEQ tablet, Take 1 tablet (20 mEq total) by mouth daily., Disp: 90 tablet, Rfl: 3   Probiotic Product (PROBIOTIC PO), Take 3 capsules by mouth 3 (three) times daily. total flora, Disp: , Rfl:    torsemide (DEMADEX) 20 MG tablet, Take 40 mg by  mouth daily., Disp: , Rfl:    Torsemide 40 MG TABS, Take 40 mg by mouth daily., Disp: 90 tablet, Rfl: 3   VITAMIN D-VITAMIN K PO, Take 1 capsule by mouth daily., Disp: , Rfl:    amoxicillin (AMOXIL) 500 MG tablet, Take 4 tablets (2,000 mg total) by mouth as directed. 1 HOUR PRIOR TO DENTAL APPOINTMENTS (Patient not taking: Reported on 10/24/2022), Disp: 12 tablet, Rfl: 6  Physical exam:  Vitals:   10/24/22 1016  BP: 112/61  Pulse: 98  Resp: 18  Temp: 97.8 F (36.6 C)  TempSrc: Tympanic  SpO2: 99%  Weight: 200 lb 11.2 oz (91 kg)  Height: 6\' 2"  (1.88 m)   Physical Exam Cardiovascular:     Rate and Rhythm: Normal rate and regular rhythm.     Heart sounds: Normal heart sounds.  Pulmonary:     Effort: Pulmonary effort is normal.     Breath sounds: Normal breath sounds.  Abdominal:     General: Bowel sounds  are normal.     Palpations: Abdomen is soft.  Skin:    General: Skin is warm and dry.     Comments: Senile purpura noted over bilateral forearms  Neurological:     Mental Status: He is alert and oriented to person, place, and time.         Latest Ref Rng & Units 08/24/2022    9:28 AM  CMP  Glucose 70 - 99 mg/dL 161   BUN 8 - 27 mg/dL 30   Creatinine 0.96 - 1.27 mg/dL 0.45   Sodium 409 - 811 mmol/L 143   Potassium 3.5 - 5.2 mmol/L 4.0   Chloride 96 - 106 mmol/L 101   CO2 20 - 29 mmol/L 31   Calcium 8.6 - 10.2 mg/dL 9.1   Total Protein 6.0 - 8.5 g/dL 5.6   Total Bilirubin 0.0 - 1.2 mg/dL 0.5   Alkaline Phos 44 - 121 IU/L 159   AST 0 - 40 IU/L 56   ALT 0 - 44 IU/L 21       Latest Ref Rng & Units 08/24/2022    9:28 AM  CBC  WBC 3.4 - 10.8 x10E3/uL 3.9   Hemoglobin 13.0 - 17.7 g/dL 91.4   Hematocrit 78.2 - 51.0 % 40.8   Platelets 150 - 450 x10E3/uL 87      Assessment and plan- Patient is a 82 y.o. male here to reestablish follow-up for chronic ITP  Patient has a normal white cell count and hemoglobin has remained stable around 13.  His platelet counts have  fluctuated over the years from 87-1 34.  His most recent platelet count from 10/10/2022 was 106 with no significant changes compared to 3 years prior.  He does not have any bleeding.  He does report bruising over his forearms likely secondary to senile purpura.  Prior testing for ITP including B12 folate HIV and hepatitis C was unremarkable.  Suspect patient has chronic ITP which does not require any treatment at this time.  Discussed what ITP is and not treatment is indicated only when platelet counts are less than 30.  Will continue to monitor his CBC every 6 months and see him back in 1 year   Visit Diagnosis 1. Thrombocytopenia (HCC)   2. Chronic ITP (idiopathic thrombocytopenia) (HCC)      Dr. Owens Shark, MD, MPH [facility] at [facility] [phone] 10/24/2022 12:30 PM

## 2022-10-25 ENCOUNTER — Telehealth: Payer: Self-pay | Admitting: Interventional Cardiology

## 2022-10-25 NOTE — Telephone Encounter (Signed)
Yes.  He can go.

## 2022-10-25 NOTE — Telephone Encounter (Signed)
Patient states he would like to go see the largest rocket blast off at the end of June and he would like to know if Dr. Eldridge Dace agrees with this due to his history. Please advise.

## 2022-10-25 NOTE — Telephone Encounter (Signed)
Per DPR it is OK to leave message.  Message left with information from Dr Eldridge Dace

## 2022-10-25 NOTE — Telephone Encounter (Signed)
Patient reports he would like to go to [place] at the end of June to see the launch of country's largest rocket.  States people will not be allowed closer than 2-2.5 miles to rocket launch site.  Due to recent TAVR patient is asking if Dr Eldridge Dace feels it would be safe for him to attend this

## 2022-10-25 NOTE — Telephone Encounter (Signed)
Called patient to get more information.  Left message to call office

## 2022-10-25 NOTE — Telephone Encounter (Signed)
Pt was returning nurse call and is requesting a callback. Please advise. 

## 2023-03-06 NOTE — Progress Notes (Unsigned)
[facility]   MULTIDISCIPLINARY HEART VALVE TEAM  Structural Heart Office Note:  .   Date:  03/06/2023  ID:  Devin Buckley, DOB 12-09-1940, MRN [id number] PCP: Danella Penton, MD  [facility] Providers Cardiologist:  Lance Muss, MD { Click to update primary MD,subspecialty MD or APP then REFRESH:1}    History of Present Illness: .    Devin Buckley is a 82 y.o. 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    Mr. Devin Buckley is followed very closely by Dr. Eldridge Dace for his aortic stenosis. Echocardiogram from 09/2021 showed an LVEF at 50-55%, normal RV function, trivial MR with a thickened and calcified aortic valve with poor leaflet excursion, mean gradient 63 mmHg, peak gradient 93.7 mmHg, AVA 0.51 cm2, DI 0.13, SVI 28, consistent with severe/critical AS. He was set up for cardiac catheterization 02/03/22 which showed mild CAD and moderate pulmonary HTN. Pre TAVR cardiac CT performed 02/08/22 demonstrated anatomy suitable for a 29 mm Edwards Sapien 3 THV.    He was then evaluated by the multidisciplinary valve team and felt to have severe, symptomatic aortic stenosis. He ultimately underwent TAVR with a 29 mm Edwards Sapien 3 THV via the TF approach on 02/28/22. Post operative echo with stable valve placement with gradients higher than immediately post op however within normal limits. Mean gradient noted to be 14.42mmHg. He was restarted on home dose Eliquis with plans for close OP follow up.    I saw him in follow up and he was doing well however had issues with significant bilateral LE edema to his thighs. He reported that the swelling began one day after discharge and progressed since that time. He was previously taking Lasix 40mg  BID. Given stable Cr we transitioned him to Torsemide 40mg  QD with K+ supplementation.    ***Today  he is here alone and continues to do very well from a valve standpoint. His edema has improved drastically. He just received his thigh high compression stockings and has been wearing them the last several days. He denies SOB, palpitations, orthopnea, bleeding in stool or urine, dizziness, or syncope.     Severe AS: s/p successful TAVR with a 29 mm Edwards Sapien 3 THV via the TF approach on 02/28/22. Post operative echo with normal LVEF at 60-65%, evidence of 29mm S3UR Sapien valvae with no PVL, mean gradient , peak 26.72mmHg, and AVA by VTI at 2.72cm2. Echo today with stable valve placement, mean gradient 15.34mmHg, no PVL, AVA at 2.5cm2 with DVI 0.38. He has NYHA class I symptoms. Continue Eliquis. Dental SBE discussed with Amoxicillin 2 grams prior to dental cleaning and procedures.    HTN: Stable. No need for additional changes today.    Permanent atrial fibrillation on Eliquis: Rates stable in the 50-60's. No on AV blocking agents. Continue Eliquis. Tolerating well.    Chronic combined systolic and diastolic CHF: Last OV patient with significant 3-4+ bilateral edema to thighs. Lasix transitioned to Torsemide 40mg  with K+ supplementation. Edema improved today. Patient has been wearing compression stockings. LE elevation at rest. Labs today with stable Cr at 0.95 and K+ at 4.1.    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 MRI/MRA assist with further characterization. Focal linear filling defect of the infrarenal abdominal aorta, differential considerations include small focal dissection or linear intraluminal plaque. Moderate right and small left pleural effusions with atelectasis. Trace abdominopelvic ascites and diffuse soft tissue anasarca. Dr. Laneta Simmers reviewed possible AV fistula with Dr. Chestine Spore and felt no  further w/u necessary    Studies Reviewed: .   Cardiac Studies & Procedures   CARDIAC CATHETERIZATION  CARDIAC CATHETERIZATION 02/03/2022  Narrative   Prox LAD to Mid LAD lesion is 25% stenosed.   Hemodynamic findings consistent with moderate pulmonary hypertension.   Aortic valve not crossed due to known severe aortic stenosis requiring valve replacement.  Nonobstructive, mild coronary artery disease.  Moderate pulmonary artery hypertension.  TAVR team contacted.  CT scans to be arranged.  Results conveyed to wife, Devin Buckley.  Findings Coronary Findings Diagnostic  Dominance: Right  Left Anterior Descending Prox LAD to Mid LAD lesion is 25% stenosed.  Left Circumflex The vessel exhibits minimal luminal irregularities.  Right Coronary Artery The vessel exhibits minimal luminal irregularities.  Intervention  No interventions have been documented.     ECHOCARDIOGRAM  ECHOCARDIOGRAM COMPLETE 04/03/2022  Narrative ECHOCARDIOGRAM REPORT    Patient Name:   Devin Buckley Date of Exam: 04/03/2022 Medical Rec #:  [id number]     Height:       74.0 in Accession #:    [id number]    Weight:       202.0 lb Date of Birth:  01/17/1941      BSA:          2.183 m Patient Age:    81 years      BP:           105/54 mmHg Patient Gender: M             HR:           79 bpm. Exam Location:  [address]  Procedure: 2D Echo, Cardiac Doppler and Color Doppler  Indications:    Z95.2 S/p TAVR  History:        Patient has prior history of Echocardiogram examinations, most recent 03/01/2022. S/p TAVR (29mm Edwards Ultra), Arrythmias:Atrial Fibrillation; Risk Factors:Hypertension and Dyslipidemia.  Sonographer:    Samule Ohm RDCS Referring Phys: [phone] Thetis Schwimmer D Yug Loria  IMPRESSIONS   1. Left ventricular ejection fraction, by estimation, is 60 to 65%. The left ventricle has normal function. The left ventricle has no regional wall motion abnormalities. Left ventricular diastolic  parameters are consistent with Grade II diastolic dysfunction (pseudonormalization). Elevated left ventricular end-diastolic pressure. 2. Right ventricular systolic function is normal. The right ventricular size is normal. 3. Left atrial size was severely dilated. 4. Right atrial size was severely dilated. 5. The mitral valve is normal in structure. Trivial mitral valve regurgitation. No evidence of mitral stenosis. 6. Post TAVR with 29 mm Sapien 3 valve No PVL mean gradient 15.6 peak 29 mmHg AVA 2.5 cm2 with DVI 0.38 Gradients have increased since immediate post implant but still in normal range Suggest f/u echo 6 months. The aortic valve has been repaired/replaced. Aortic valve regurgitation is not visualized. No aortic stenosis is present. 7. Aortic dilatation noted. There is moderate dilatation of the ascending aorta, measuring 42 mm. 8. The inferior vena cava is normal in size with greater than 50% respiratory variability, suggesting right atrial pressure of 3 mmHg.  FINDINGS Left Ventricle: Left ventricular ejection fraction, by estimation, is 60 to 65%. The left ventricle has normal  function. The left ventricle has no regional wall motion abnormalities. The left ventricular internal cavity size was normal in size. There is no left ventricular hypertrophy. Left ventricular diastolic parameters are consistent with Grade II diastolic dysfunction (pseudonormalization). Elevated left ventricular end-diastolic pressure.  Right Ventricle: The right ventricular size is normal. No increase in right ventricular wall thickness. Right ventricular systolic function is normal.  Left Atrium: Left atrial size was severely dilated.  Right Atrium: Right atrial size was severely dilated.  Pericardium: Trivial pericardial effusion is present. The pericardial effusion is localized near the right atrium.  Mitral Valve: The mitral valve is normal in structure. Trivial mitral valve regurgitation. No evidence  of mitral valve stenosis.  Tricuspid Valve: The tricuspid valve is normal in structure. Tricuspid valve regurgitation is mild . No evidence of tricuspid stenosis.  Aortic Valve: Post TAVR with 29 mm Sapien 3 valve No PVL mean gradient 15.6 peak 29 mmHg AVA 2.5 cm2 with DVI 0.38 Gradients have increased since immediate post implant but still in normal range Suggest f/u echo 6 months. The aortic valve has been repaired/replaced. Aortic valve regurgitation is not visualized. No aortic stenosis is present. Aortic valve mean gradient measures 15.6 mmHg. Aortic valve peak gradient measures 29.0 mmHg. Aortic valve area, by VTI measures 2.51 cm.  Pulmonic Valve: The pulmonic valve was normal in structure. Pulmonic valve regurgitation is not visualized. No evidence of pulmonic stenosis.  Aorta: Aortic dilatation noted. There is moderate dilatation of the ascending aorta, measuring 42 mm.  Venous: The inferior vena cava is normal in size with greater than 50% respiratory variability, suggesting right atrial pressure of 3 mmHg.  IAS/Shunts: No atrial level shunt detected by color flow Doppler.   LEFT VENTRICLE PLAX 2D LVIDd:         5.80 cm   Diastology LVIDs:         4.10 cm   LV e' medial:    8.12 cm/s LV PW:         1.20 cm   LV E/e' medial:  17.9 LV IVS:        1.10 cm   LV e' lateral:   12.30 cm/s LVOT diam:     2.90 cm   LV E/e' lateral: 11.8 LV SV:         139 LV SV Index:   64 LVOT Area:     6.61 cm   RIGHT VENTRICLE             IVC RV S prime:     15.97 cm/s  IVC diam: 2.80 cm TAPSE (M-mode): 1.8 cm RVSP:           47.7 mmHg  LEFT ATRIUM              Index        RIGHT ATRIUM           Index LA diam:        5.40 cm  2.47 cm/m   RA Pressure: 8.00 mmHg LA Vol (A2C):   107.0 ml 49.02 ml/m  RA Area:     41.50 cm LA Vol (A4C):   116.0 ml 53.15 ml/m  RA Volume:   170.00 ml 77.89 ml/m LA Biplane Vol: 116.0 ml 53.15 ml/m AORTIC VALVE AV Area (Vmax):    2.56 cm AV Area (Vmean):    2.54 cm AV Area (VTI):     2.51 cm AV Vmax:           269.20  cm/s AV Vmean:          182.600 cm/s AV VTI:            0.554 m AV Peak Grad:      29.0 mmHg AV Mean Grad:      15.6 mmHg LVOT Vmax:         104.20 cm/s LVOT Vmean:        70.280 cm/s LVOT VTI:          0.210 m LVOT/AV VTI ratio: 0.38  AORTA Ao Root diam: 4.90 cm Ao Asc diam:  4.20 cm  MITRAL VALVE                TRICUSPID VALVE MV Area (PHT): 2.85 cm     TR Peak grad:   39.7 mmHg MV Decel Time: 267 msec     TR Vmax:        315.00 cm/s MV E velocity: 145.00 cm/s  Estimated RAP:  8.00 mmHg RVSP:           47.7 mmHg  SHUNTS Systemic VTI:  0.21 m Systemic Diam: 2.90 cm  Charlton Haws MD Electronically signed by Charlton Haws MD Signature Date/Time: 04/03/2022/11:02:47 AM    Final     CT SCANS  CT CORONARY MORPH W/CTA COR W/SCORE 02/08/2022  Addendum 02/08/2022 12:58 PM ADDENDUM REPORT: 02/08/2022 12:40  FINDINGS: Extracardiac findings will be described separately under dictation for contemporaneously obtained CTA chest, abdomen and pelvis.  IMPRESSION: Please see separate dictation for contemporaneously obtained CTA chest, abdomen and pelvis dated 02/08/2022 for full description of relevant extracardiac findings.   Electronically Signed By: Allegra Lai M.D. On: 02/08/2022 12:40  Narrative CLINICAL DATA:  Aortic Stenosis  EXAM: Cardiac TAVR CT  TECHNIQUE: The patient was scanned on a Siemens Force 192 slice scanner. A 120 kV retrospective scan was triggered in the ascending thoracic aorta at 140 HU's. Gantry rotation speed was 250 msecs and collimation was .6 mm. No beta blockade or nitro were given. The 3D data set was reconstructed in 5% intervals of the R-R cycle. Systolic and diastolic phases were analyzed on a dedicated work station using MPR, MIP and VRT modes. The patient received 80 cc of contrast.  FINDINGS: Aortic Valve: Heavily calcified tri leaflet AV score 5996  Aorta:  Dilated ascending thoracic aorta. Normal arch vessels moderate calcific atherosclerosis  Sino-tubular Junction: 33 mm  Ascending Thoracic Aorta: Dilated 41 mm  Aortic Arch: 28 mm  Descending Thoracic Aorta: 29 mm  Sinus of Valsalva Measurements:  Non-coronary: 36.7 mm  Right - coronary: 35.6 mm  Left -   coronary: 33.9 mm  Coronary Artery Height above Annulus:  Left Main: 14.3 mm above annulus  Right Coronary: 14 mm above annulus  Virtual Basal Annulus Measurements:  Maximum / Minimum Diameter: 30.9 mm x 22.8 mm  Perimeter: 90.7 mm  Area: 595 mm2  Coronary Arteries: Sufficient height above annulus for deployment  Optimum Fluoroscopic Angle for Delivery: LAO 14 Caudal 14  IMPRESSION: 1. Tri leaflet AV with calcium score 5996  2.  Annular area of 595 mm2 suitable for a 29 mm Sapien 3 valve  3.  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 Optimum angiographic angle for deployment LAO 14 Caudal 14 degrees  5.  Membranous septal length 12.3 mm  Charlton Haws  Electronically Signed: By: Charlton Haws M.D. On: 02/08/2022 12:32            Risk Assessment/Calculations:   {Does this  patient have ATRIAL FIBRILLATION?:[id number]} No BP recorded.  {Refresh Note OR Click here to enter BP  :1}***         {This patient may be at risk for Amyloid.  Click HERE to open Cardiac Amyloid Screening SmartSet to order screening OR Click HERE to defer testing for 1 year or permanently :1}      Physical Exam:   VS:  There were no vitals taken for this visit.   Wt Readings from Last 3 Encounters:  10/24/22 200 lb 11.2 oz (91 kg)  08/24/22 196 lb 3.2 oz (89 kg)  04/03/22 180 lb 6.4 oz (81.8 kg)     General: Well developed, well nourished, NAD Skin: Warm, dry, intact  Head: Normocephalic, atraumatic, sclera non-icteric, no xanthomas, clear, moist mucus membranes. Neck: Negative for carotid bruits. No JVD Lungs:Clear to ausculation bilaterally. No wheezes, rales, or  rhonchi. Breathing is unlabored. Cardiovascular: RRR with S1 S2. No murmurs, rubs, gallops, or LV heave appreciated. Abdomen: Soft, non-tender, non-distended with normoactive bowel sounds. No hepatomegaly, No rebound/guarding. No obvious abdominal masses. MSK: Strength and tone appear normal for age. 5/5 in all extremities Extremities: No edema. No clubbing or cyanosis. DP/PT pulses 2+ bilaterally Neuro: Alert and oriented. No focal deficits. No facial asymmetry. MAE spontaneously. Psych: Responds to questions appropriately with normal affect.    ASSESSMENT AND PLAN: .   ***    {Are you ordering a CV Procedure (e.g. stress test, cath, DCCV, TEE, etc)?   Press F2        :[id number]}  Dispo: ***  Signed, [name], NP

## 2023-03-07 ENCOUNTER — Ambulatory Visit (INDEPENDENT_AMBULATORY_CARE_PROVIDER_SITE_OTHER): Payer: Medicare Other | Admitting: Cardiology

## 2023-03-07 ENCOUNTER — Ambulatory Visit (HOSPITAL_COMMUNITY): Payer: Medicare Other | Attending: Cardiology

## 2023-03-07 VITALS — BP 130/74 | HR 64 | Ht 74.0 in | Wt 206.0 lb

## 2023-03-07 DIAGNOSIS — I482 Chronic atrial fibrillation, unspecified: Secondary | ICD-10-CM

## 2023-03-07 DIAGNOSIS — I5042 Chronic combined systolic (congestive) and diastolic (congestive) heart failure: Secondary | ICD-10-CM | POA: Insufficient documentation

## 2023-03-07 DIAGNOSIS — R600 Localized edema: Secondary | ICD-10-CM | POA: Insufficient documentation

## 2023-03-07 DIAGNOSIS — Z952 Presence of prosthetic heart valve: Secondary | ICD-10-CM

## 2023-03-07 DIAGNOSIS — I350 Nonrheumatic aortic (valve) stenosis: Secondary | ICD-10-CM

## 2023-03-07 MED ORDER — TORSEMIDE 20 MG PO TABS: 40.0000 mg | ORAL_TABLET | Freq: Every day | ORAL | 3 refills | Status: DC

## 2023-03-07 MED ORDER — APIXABAN 5 MG PO TABS: 5.0000 mg | ORAL_TABLET | Freq: Two times a day (BID) | ORAL | 11 refills | Status: DC

## 2023-03-07 NOTE — Patient Instructions (Addendum)
Medication Instructions:  Your physician recommends that you continue on your current medications as directed. Please refer to the Current Medication list given to you today.  We have sent in refills for your Eliquis and your torsemide.  *If you need a refill on your cardiac medications before your next appointment, please call your pharmacy*  Lab Work: None ordered today.  Testing/Procedures: None ordered today.  Follow-Up: At [facility], you and your health needs are our priority.  As part of our continuing mission to provide you with exceptional heart care, we have created designated Provider Care Teams.  These Care Teams include your primary Cardiologist (physician) and Advanced Practice Providers (APPs -  Physician Assistants and Nurse Practitioners) who all work together to provide you with the care you need, when you need it.  Your next appointment:   6 month(s)  The format for your next appointment:   In Person  Provider:   Riley Lam, MD or Alverda Skeans, MD

## 2023-03-08 ENCOUNTER — Telehealth: Payer: Self-pay | Admitting: Cardiology

## 2023-03-08 NOTE — Telephone Encounter (Signed)
Error in charting.

## 2023-03-13 ENCOUNTER — Other Ambulatory Visit: Payer: Self-pay | Admitting: Cardiology

## 2023-03-13 DIAGNOSIS — T50905D Adverse effect of unspecified drugs, medicaments and biological substances, subsequent encounter: Secondary | ICD-10-CM

## 2023-03-14 ENCOUNTER — Ambulatory Visit: Payer: Medicare Other | Attending: Cardiology

## 2023-03-14 DIAGNOSIS — T50905D Adverse effect of unspecified drugs, medicaments and biological substances, subsequent encounter: Secondary | ICD-10-CM

## 2023-03-15 LAB — BASIC METABOLIC PANEL
BUN/Creatinine Ratio: 24 (ref 10–24)
BUN: 33 mg/dL — ABNORMAL HIGH (ref 8–27)
CO2: 32 mmol/L — ABNORMAL HIGH (ref 20–29)
Calcium: 9.3 mg/dL (ref 8.6–10.2)
Chloride: 95 mmol/L — ABNORMAL LOW (ref 96–106)
Creatinine, Ser: 1.4 mg/dL — ABNORMAL HIGH (ref 0.76–1.27)
Glucose: 154 mg/dL — ABNORMAL HIGH (ref 70–99)
Potassium: 4.1 mmol/L (ref 3.5–5.2)
Sodium: 141 mmol/L (ref 134–144)
eGFR: 50 mL/min/{1.73_m2} — ABNORMAL LOW (ref >59)

## 2023-04-03 ENCOUNTER — Encounter (HOSPITAL_COMMUNITY): Payer: Self-pay | Admitting: Cardiology

## 2023-04-03 ENCOUNTER — Other Ambulatory Visit (HOSPITAL_COMMUNITY): Payer: Self-pay

## 2023-04-03 ENCOUNTER — Ambulatory Visit (HOSPITAL_COMMUNITY)
Admission: RE | Admit: 2023-04-03 | Discharge: 2023-04-03 | Disposition: A | Discharge status: Home / Self Care | Payer: Medicare Other | Source: Ambulatory Visit | Attending: Cardiology | Admitting: Cardiology

## 2023-04-03 VITALS — BP 130/70 | HR 75 | Wt 193.6 lb

## 2023-04-03 DIAGNOSIS — I4821 Permanent atrial fibrillation: Secondary | ICD-10-CM | POA: Diagnosis present

## 2023-04-03 DIAGNOSIS — I350 Nonrheumatic aortic (valve) stenosis: Secondary | ICD-10-CM | POA: Diagnosis not present

## 2023-04-03 DIAGNOSIS — I5042 Chronic combined systolic (congestive) and diastolic (congestive) heart failure: Secondary | ICD-10-CM | POA: Diagnosis present

## 2023-04-03 DIAGNOSIS — Z952 Presence of prosthetic heart valve: Secondary | ICD-10-CM | POA: Diagnosis not present

## 2023-04-03 DIAGNOSIS — I110 Hypertensive heart disease with heart failure: Secondary | ICD-10-CM | POA: Insufficient documentation

## 2023-04-03 DIAGNOSIS — I361 Nonrheumatic tricuspid (valve) insufficiency: Secondary | ICD-10-CM | POA: Diagnosis not present

## 2023-04-03 DIAGNOSIS — R9431 Abnormal electrocardiogram [ECG] [EKG]: Secondary | ICD-10-CM | POA: Insufficient documentation

## 2023-04-03 DIAGNOSIS — I5032 Chronic diastolic (congestive) heart failure: Secondary | ICD-10-CM | POA: Insufficient documentation

## 2023-04-03 LAB — ECHOCARDIOGRAM COMPLETE
AR max vel: 1.07 cm2
AV Area VTI: 1.13 cm2
AV Area mean vel: 1.15 cm2
AV Mean grad: 18 mm[Hg]
AV Peak grad: 31.6 mm[Hg]
Ao pk vel: 2.81 m/s
Area-P 1/2: 3.03 cm2
S' Lateral: 3.6 cm

## 2023-04-03 MED ORDER — DAPAGLIFLOZIN PROPANEDIOL 10 MG PO TABS: 10.0000 mg | ORAL_TABLET | Freq: Every day | ORAL | 11 refills | Status: DC

## 2023-04-03 MED ORDER — APIXABAN 5 MG PO TABS: 5.0000 mg | ORAL_TABLET | Freq: Two times a day (BID) | ORAL | 11 refills | Status: DC

## 2023-04-03 NOTE — Progress Notes (Signed)
PCP: Devin Penton, MD Cardiology: Previously Devin Buckley HF Cardiology: Dr. Shirlee Buckley  82 y.o. with history of permanent atrial fibrillation, aortic stenosis s/p TAVR, and CHF was referred for evaluation of CHF by Devin Chard, NP.  In 9/23, patient had TAVR for severe AS with 29 mm Edwards Sapien 3 THV.  Prior to TAVR, he had LHC in 8/23 showing minimal CAD.  Initial post-TAVR echo in 10/23 showed EF 60-65%, normal RV, trivial MR, TAVR valve with no PVL and mean gradient 15 mmHg.  Repeat echo was done in 9/24, this showed development of RV failure and severe TR; EF 60-65%, mild LVH, D-shaped interventricular septum with severe RV enlargement, mild RV dysfunction, PASP 50 mmHg, TAVR valve with mean gradient 18 mmHg, mild MR, severe TR, dilated IVC. Of note, patient has not been taking Eliquis.  He has been concerned about bruising.  Due to development of exertional dyspnea over the last few months, he has been taking torsemide.  Initially, he was taking torsemide 40 mg bid. Currently taking torsemide 30 mg daily.  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  He walks up to a mile on his treadmill with no dyspnea.  However, he does get short of breath walking up hills/inclines or doing heavier activity.  No orthopnea/PND.  No chest pain. No lightheadedness.  Weight has gone down with torsemide.   ECG (personally reviewed): Atrial fibrillation, left axis deviation, inferior Qs  Labs (10/24): LDL 93, K 4.1, creatinine 1.1, hgb 13.7  PMH: 1. HTN 2. Hyperlipidemia 3. Aortic stenosis: Severe. S/p TAVR in 9/23 with 29 mm Edwards Sapien 3 THV.  - Echo (10/23): EF 60-65%, normal RV, trivial MR, TAVR valve with no PVL and mean gradient 15 mmHg.  - Echo (9/24): EF 60-65%, mild LVH, D-shaped interventricular septum with severe RV enlargement, mild RV dysfunction, PASP 50 mmHg, TAVR valve with mean gradient 18 mmHg, mild MR, severe TR, dilated IVC.  4. Tricuspid regurgitation: Severe.  Noted first on 9/24  echo.  5. LHC (8/23) with minimal CAD (pre-TAVR).   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   Family History  Problem Relation Age of Onset   Colon cancer Mother    Alzheimer's disease Father    CVA Father    Diabetes Brother    ROS: All systems reviewed and negative except as per HPI.   Current Outpatient Medications  Medication Sig Dispense Refill   amoxicillin (AMOXIL) 500 MG tablet Take 4 tablets (2,000 mg total) by mouth as directed. 1 HOUR PRIOR TO DENTAL APPOINTMENTS 12 tablet 6   buprenorphine-naloxone (SUBOXONE) 8-2 mg SUBL SL tablet Place 2.5 tablets under the tongue daily.     clonazePAM (KLONOPIN) 0.5 MG tablet Take 0.5 mg by mouth 2 (two) times daily.     COLLAGEN PO Take 2 Scoops by mouth 3 (three) times daily.     dapagliflozin propanediol (FARXIGA) 10 MG TABS tablet Take 1 tablet (10 mg total) by mouth daily before breakfast. 30 tablet 11   Digestive Enzyme CAPS Take 2 capsules by mouth 2 (two) times daily.     Multiple Vitamin (MULTIVITAMIN WITH MINERALS) TABS tablet Take 3 tablets by mouth daily. Total minerals     potassium chloride SA (KLOR-CON M) 20 MEQ tablet Take 1 tablet (20 mEq total) by mouth daily. 90 tablet 3   Probiotic Product (PROBIOTIC PO) Take 3 capsules by mouth 3 (three) times daily. total flora     torsemide (  DEMADEX) 20 MG tablet Take 2 tablets (40 mg total) by mouth daily. 180 tablet 3   VITAMIN D-VITAMIN K PO Take 1 capsule by mouth daily.     apixaban (ELIQUIS) 5 MG TABS tablet Take 1 tablet (5 mg total) by mouth 2 (two) times daily. (Patient not taking: Reported on 04/03/2023) 60 tablet 11   No current facility-administered medications for this encounter.   BP 130/70   Pulse 75   Wt 87.8 kg (193 lb 9.6 oz)   SpO2 98%   BMI 24.86 kg/m  General: NAD Neck: JVP 8-9 cm with HJR, no thyromegaly or thyroid nodule.  Lungs: Clear to auscultation bilaterally with normal respiratory effort. CV:  Nondisplaced PMI.  Heart regular S1/S2, no S3/S4, 3/6 HSM LLSB.  1+ edema to knees.  No carotid bruit.  Normal pedal pulses.  Abdomen: Soft, nontender, no hepatosplenomegaly, no distention.  Skin: Intact without lesions or rashes.  Neurologic: Alert and oriented x 3.  Psych: Normal affect. Extremities: No clubbing or cyanosis.  HEENT: Normal.   Assessment/Plan: 1. Atrial fibrillation: This is permanent x years with dilated atria.  CHADSVASC = 3 (age, CHF).  He does not want to take Eliquis due to history of easy bruising.  We had a long discussion about this today.  I do not think he would be an ideal candidate for Watchman alone, as I have some concern about the rising mean gradient across his bioprosthetic aortic valve and worry about the possibility of subclinical valve thrombosis (HALT/HAM) development.  Rate control is reasonable without a nodal blocker.  - I strongly encouraged him to start Eliquis 5 mg bid.  He says that he will.  2. Aortic stenosis s/p TAVR: Mean gradient across his bioprosthetic valve has increased with time, most recent echo in 9/24 with mean gradient 18 mmHg.  This is not markedly high, but if it continues to rise will need workup for subclinical valve thrombosis.  - For now, would strongly recommend that he start Eliquis 5 mg bid.  3. HTN: BP is not significantly elevated.  4. Chronic diastolic CHF: Prominent RV dysfunction with severe TR.  This is a new finding on the 9/24 echo compared to 10/23 echo.  Echo in 9/24 showed EF 60-65%, mild LVH, D-shaped interventricular septum with severe RV enlargement, mild RV dysfunction, PASP 50 mmHg, TAVR valve with mean gradient 18 mmHg, mild MR, severe TR, dilated IVC. I think his symptoms and volume overload are primarily due to RV failure.   He has been on torsemide but still appears volume overloaded today, NYHA class II. - Increase torsemide to 40 qam/20 qpm x 3 days then 40 mg daily after that. BMET/BNP in 10 days.  - Start  Farxiga 10 mg daily.  - Increase KCl to 20 bid.  - I will arrange for RHC to assess filling pressures and PA pressure.  We discussed risks/benefits and he agrees to the procedure. He is going to call back about timing when he has looked at his schedule. Hold Eliquis day before/day of.  5. Tricuspid regurgitation: This is severe on last echo in 9/24 with dilated RV and D-shaped interventricular septum.  The cause of this is uncertain.  I do not think that his aortic valve is markedly changed, mean gradient is only mildly higher.   - I will arrange for TEE for closer evaluation of the tricuspid valve.  He may be a candidate for Triclip in the future. We discussed risks/benefits and he agrees to  the procedure but will have to call back to schedule.   Followup with me in 1 month after procedures.   Devin Buckley 04/03/2023

## 2023-04-03 NOTE — H&P (View-Only) (Signed)
PCP: Danella Penton, MD Cardiology: Previously Eldridge Dace HF Cardiology: Dr. Shirlee Latch  82 y.o. with history of permanent atrial fibrillation, aortic stenosis s/p TAVR, and CHF was referred for evaluation of CHF by [name], NP.  In 9/23, patient had TAVR for severe AS with 29 mm Edwards Sapien 3 THV.  Prior to TAVR, he had LHC in 8/23 showing minimal CAD.  Initial post-TAVR echo in 10/23 showed EF 60-65%, normal RV, trivial MR, TAVR valve with no PVL and mean gradient 15 mmHg.  Repeat echo was done in 9/24, this showed development of RV failure and severe TR; EF 60-65%, mild LVH, D-shaped interventricular septum with severe RV enlargement, mild RV dysfunction, PASP 50 mmHg, TAVR valve with mean gradient 18 mmHg, mild MR, severe TR, dilated IVC. Of note, patient has not been taking Eliquis.  He has been concerned about bruising.  Due to development of exertional dyspnea over the last few months, he has been taking torsemide.  Initially, he was taking torsemide 40 mg bid. Currently taking torsemide 30 mg daily.  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  He walks up to a mile on his treadmill with no dyspnea.  However, he does get short of breath walking up hills/inclines or doing heavier activity.  No orthopnea/PND.  No chest pain. No lightheadedness.  Weight has gone down with torsemide.   ECG (personally reviewed): Atrial fibrillation, left axis deviation, inferior Qs  Labs (10/24): LDL 93, K 4.1, creatinine 1.1, hgb 13.7  PMH: 1. HTN 2. Hyperlipidemia 3. Aortic stenosis: Severe. S/p TAVR in 9/23 with 29 mm Edwards Sapien 3 THV.  - Echo (10/23): EF 60-65%, normal RV, trivial MR, TAVR valve with no PVL and mean gradient 15 mmHg.  - Echo (9/24): EF 60-65%, mild LVH, D-shaped interventricular septum with severe RV enlargement, mild RV dysfunction, PASP 50 mmHg, TAVR valve with mean gradient 18 mmHg, mild MR, severe TR, dilated IVC.  4. Tricuspid regurgitation: Severe.  Noted first on 9/24  echo.  5. LHC (8/23) with minimal CAD (pre-TAVR).   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   Family History  Problem Relation Age of Onset   Colon cancer Mother    Alzheimer's disease Father    CVA Father    Diabetes Brother    ROS: All systems reviewed and negative except as per HPI.   Current Outpatient Medications  Medication Sig Dispense Refill   amoxicillin (AMOXIL) 500 MG tablet Take 4 tablets (2,000 mg total) by mouth as directed. 1 HOUR PRIOR TO DENTAL APPOINTMENTS 12 tablet 6   buprenorphine-naloxone (SUBOXONE) 8-2 mg SUBL SL tablet Place 2.5 tablets under the tongue daily.     clonazePAM (KLONOPIN) 0.5 MG tablet Take 0.5 mg by mouth 2 (two) times daily.     COLLAGEN PO Take 2 Scoops by mouth 3 (three) times daily.     dapagliflozin propanediol (FARXIGA) 10 MG TABS tablet Take 1 tablet (10 mg total) by mouth daily before breakfast. 30 tablet 11   Digestive Enzyme CAPS Take 2 capsules by mouth 2 (two) times daily.     Multiple Vitamin (MULTIVITAMIN WITH MINERALS) TABS tablet Take 3 tablets by mouth daily. Total minerals     potassium chloride SA (KLOR-CON M) 20 MEQ tablet Take 1 tablet (20 mEq total) by mouth daily. 90 tablet 3   Probiotic Product (PROBIOTIC PO) Take 3 capsules by mouth 3 (three) times daily. total flora     torsemide (  DEMADEX) 20 MG tablet Take 2 tablets (40 mg total) by mouth daily. 180 tablet 3   VITAMIN D-VITAMIN K PO Take 1 capsule by mouth daily.     apixaban (ELIQUIS) 5 MG TABS tablet Take 1 tablet (5 mg total) by mouth 2 (two) times daily. (Patient not taking: Reported on 04/03/2023) 60 tablet 11   No current facility-administered medications for this encounter.   BP 130/70   Pulse 75   Wt 87.8 kg (193 lb 9.6 oz)   SpO2 98%   BMI 24.86 kg/m  General: NAD Neck: JVP 8-9 cm with HJR, no thyromegaly or thyroid nodule.  Lungs: Clear to auscultation bilaterally with normal respiratory effort. CV:  Nondisplaced PMI.  Heart regular S1/S2, no S3/S4, 3/6 HSM LLSB.  1+ edema to knees.  No carotid bruit.  Normal pedal pulses.  Abdomen: Soft, nontender, no hepatosplenomegaly, no distention.  Skin: Intact without lesions or rashes.  Neurologic: Alert and oriented x 3.  Psych: Normal affect. Extremities: No clubbing or cyanosis.  HEENT: Normal.   Assessment/Plan: 1. Atrial fibrillation: This is permanent x years with dilated atria.  CHADSVASC = 3 (age, CHF).  He does not want to take Eliquis due to history of easy bruising.  We had a long discussion about this today.  I do not think he would be an ideal candidate for Watchman alone, as I have some concern about the rising mean gradient across his bioprosthetic aortic valve and worry about the possibility of subclinical valve thrombosis (HALT/HAM) development.  Rate control is reasonable without a nodal blocker.  - I strongly encouraged him to start Eliquis 5 mg bid.  He says that he will.  2. Aortic stenosis s/p TAVR: Mean gradient across his bioprosthetic valve has increased with time, most recent echo in 9/24 with mean gradient 18 mmHg.  This is not markedly high, but if it continues to rise will need workup for subclinical valve thrombosis.  - For now, would strongly recommend that he start Eliquis 5 mg bid.  3. HTN: BP is not significantly elevated.  4. Chronic diastolic CHF: Prominent RV dysfunction with severe TR.  This is a new finding on the 9/24 echo compared to 10/23 echo.  Echo in 9/24 showed EF 60-65%, mild LVH, D-shaped interventricular septum with severe RV enlargement, mild RV dysfunction, PASP 50 mmHg, TAVR valve with mean gradient 18 mmHg, mild MR, severe TR, dilated IVC. I think his symptoms and volume overload are primarily due to RV failure.   He has been on torsemide but still appears volume overloaded today, NYHA class II. - Increase torsemide to 40 qam/20 qpm x 3 days then 40 mg daily after that. BMET/BNP in 10 days.  - Start  Farxiga 10 mg daily.  - Increase KCl to 20 bid.  - I will arrange for RHC to assess filling pressures and PA pressure.  We discussed risks/benefits and he agrees to the procedure. He is going to call back about timing when he has looked at his schedule. Hold Eliquis day before/day of.  5. Tricuspid regurgitation: This is severe on last echo in 9/24 with dilated RV and D-shaped interventricular septum.  The cause of this is uncertain.  I do not think that his aortic valve is markedly changed, mean gradient is only mildly higher.   - I will arrange for TEE for closer evaluation of the tricuspid valve.  He may be a candidate for Triclip in the future. We discussed risks/benefits and he agrees to  the procedure but will have to call back to schedule.   Followup with me in 1 month after procedures.   Marca Ancona 04/03/2023

## 2023-04-03 NOTE — Patient Instructions (Addendum)
CHANGE Torsemide to 40 mg in the morning and 20 mg in the evening for 3 days, then change to 40 mg daily  RESTART Eliquis 5 mg Twice daily  START Farxiga 10 mg daily.  Blood work in 7 days at [facility].  Your physician recommends that you schedule a follow-up appointment in: 1 month.  If you have any questions or concerns before your next appointment please send [place] a message through [place] or call our office at [phone].    TO LEAVE A MESSAGE FOR THE NURSE SELECT OPTION 2, PLEASE LEAVE A MESSAGE INCLUDING: YOUR NAME DATE OF BIRTH CALL BACK NUMBER REASON FOR CALL**this is important as we prioritize the call backs  YOU WILL RECEIVE A CALL BACK THE SAME DAY AS LONG AS YOU CALL BEFORE 4:00 PM  At the [facility], you and your health needs are our priority. As part of our continuing mission to provide you with exceptional heart care, we have created designated Provider Care Teams. These Care Teams include your primary Cardiologist (physician) and Advanced Practice Providers (APPs- Physician Assistants and Nurse Practitioners) who all work together to provide you with the care you need, when you need it.   You may see any of the following providers on your designated Care Team at your next follow up: Dr Arvilla Meres Dr Marca Ancona Dr. Dorthula Nettles Dr. Clearnce Hasten Amy Filbert Schilder, NP Robbie Lis, [place] [facility] [place], [place] Brynda Peon, NP [place] Lee, NP Karle Plumber, PharmD   Please be sure to bring in all your medications bottles to every appointment.    Thank you for choosing [facility]

## 2023-04-04 ENCOUNTER — Telehealth (HOSPITAL_COMMUNITY): Payer: Self-pay | Admitting: Cardiology

## 2023-04-04 NOTE — Telephone Encounter (Signed)
After NP appt 10/15 pt wanted to confirm if its ok to continue exercise via treadmill.  Reports new diagnosis of a leaky valve

## 2023-04-10 ENCOUNTER — Other Ambulatory Visit (HOSPITAL_COMMUNITY): Payer: Medicare Other

## 2023-04-11 ENCOUNTER — Ambulatory Visit (HOSPITAL_COMMUNITY)
Admission: RE | Admit: 2023-04-11 | Discharge: 2023-04-11 | Disposition: A | Discharge status: Home / Self Care | Payer: Medicare Other | Source: Ambulatory Visit | Attending: Cardiology | Admitting: Cardiology

## 2023-04-11 DIAGNOSIS — I5042 Chronic combined systolic (congestive) and diastolic (congestive) heart failure: Secondary | ICD-10-CM | POA: Diagnosis present

## 2023-04-11 LAB — BASIC METABOLIC PANEL
Anion gap: 7 (ref 5–15)
BUN: 28 mg/dL — ABNORMAL HIGH (ref 8–23)
CO2: 34 mmol/L — ABNORMAL HIGH (ref 22–32)
Calcium: 9.3 mg/dL (ref 8.9–10.3)
Chloride: 100 mmol/L (ref 98–111)
Creatinine, Ser: 1.28 mg/dL — ABNORMAL HIGH (ref 0.61–1.24)
GFR, Estimated: 56 mL/min — ABNORMAL LOW (ref >60)
Glucose, Bld: 112 mg/dL — ABNORMAL HIGH (ref 70–99)
Potassium: 4.6 mmol/L (ref 3.5–5.1)
Sodium: 141 mmol/L (ref 135–145)

## 2023-04-11 LAB — BRAIN NATRIURETIC PEPTIDE: B Natriuretic Peptide: 485.2 pg/mL — ABNORMAL HIGH (ref 0.0–100.0)

## 2023-04-11 NOTE — Patient Instructions (Signed)
  [facility] [address] [place] [address] Dept: [phone] Loc: [phone]  Devin Buckley  04/11/2023  You are scheduled for a TEE and  Cardiac Catheterization on Tuesday, November 12 with Dr. Marca Ancona.  1. Please arrive at the [facility] (Main Entrance A) at [facility]: [address] [place], [place] [address] at 8:00 AM (This time is 1.5 hour(s) before your procedure to ensure your preparation). Free valet parking service is available. You will check in at ADMITTING. The support person will be asked to wait in the waiting room.  It is OK to have someone drop you off and come back when you are ready to be discharged.    Special note: Every effort is made to have your procedure done on time. Please understand that emergencies sometimes delay scheduled procedures.  2. Diet: Do not eat solid foods after midnight.  The patient may have clear liquids until 5am upon the day of the procedure.  3. Labs: pre procedure labs done 04/11/23  4. Medication instructions in preparation for your procedure:   Contrast Allergy: No   Stop taking Eliquis (Apixiban) on Monday, November 11. HOLD Tuesday November 12  Stop taking, Torsemide [facility]) Tuesday, November 12, Stop taking, Farxiga 72 hours prior to your procedure, last dose Friday 11/8     On the morning of your procedure, you may take any morning medicines NOT listed above.  You may use sips of water.  5. Plan to go home the same day, you will only stay overnight if medically necessary. 6. Bring a current list of your medications and current insurance cards. 7. You MUST have a responsible person to drive you home. 8. Someone MUST be with you the first 24 hours after you arrive home or your discharge will be delayed. 9. Please wear clothes that are easy to get on and off and wear slip-on shoes.  Thank you for allowing [place] to care for  you!   -- [facility]

## 2023-04-11 NOTE — Progress Notes (Signed)
  [facility] [address] [place] [address] Dept: [phone] Loc: [phone]  BLAS FIKES  04/11/2023  You are scheduled for a TEE and  Cardiac Catheterization on Tuesday, November 12 with Dr. Marca Ancona.  1. Please arrive at the [facility] (Main Entrance A) at [facility]: [address] [place], [place] [address] at 8:00 AM (This time is 1.5 hour(s) before your procedure to ensure your preparation). Free valet parking service is available. You will check in at ADMITTING. The support person will be asked to wait in the waiting room.  It is OK to have someone drop you off and come back when you are ready to be discharged.    Special note: Every effort is made to have your procedure done on time. Please understand that emergencies sometimes delay scheduled procedures.  2. Diet: Do not eat solid foods after midnight.  The patient may have clear liquids until 5am upon the day of the procedure.  3. Labs: pre procedure labs done 04/11/23  4. Medication instructions in preparation for your procedure:   Contrast Allergy: No   Stop taking Eliquis (Apixiban) on Monday, November 11. HOLD Tuesday November 12  Stop taking, Torsemide [facility]) Tuesday, November 12, Stop taking, Farxiga 72 hours prior to your procedure, last dose Friday 11/8     On the morning of your procedure, you may take any morning medicines NOT listed above.  You may use sips of water.  5. Plan to go home the same day, you will only stay overnight if medically necessary. 6. Bring a current list of your medications and current insurance cards. 7. You MUST have a responsible person to drive you home. 8. Someone MUST be with you the first 24 hours after you arrive home or your discharge will be delayed. 9. Please wear clothes that are easy to get on and off and wear slip-on shoes.  Thank you for allowing [place] to care for  you!   -- [facility]

## 2023-04-20 ENCOUNTER — Encounter (HOSPITAL_COMMUNITY): Payer: Self-pay

## 2023-04-26 ENCOUNTER — Other Ambulatory Visit: Payer: Medicare Other

## 2023-04-30 NOTE — Progress Notes (Signed)
Unable to reach patient about procedure, but was able to leave a detailed message. Stated that the patient needed to arrive at the hospital at 0730 , remain NPO after 0000, needs to have a ride home and a responsible adult to stay with them for 24 hours after the procedure. Instructed the patient to call back if they had any questions.

## 2023-05-01 ENCOUNTER — Ambulatory Visit (HOSPITAL_COMMUNITY): Payer: Medicare Other

## 2023-05-01 ENCOUNTER — Other Ambulatory Visit: Payer: Self-pay

## 2023-05-01 ENCOUNTER — Ambulatory Visit (HOSPITAL_COMMUNITY)
Admission: RE | Admit: 2023-05-01 | Discharge: 2023-05-01 | Disposition: A | Discharge status: Home / Self Care | Payer: Medicare Other | Attending: Cardiology | Admitting: Cardiology

## 2023-05-01 ENCOUNTER — Encounter (HOSPITAL_COMMUNITY)
Admission: RE | Disposition: A | Discharge status: Home / Self Care | Payer: Self-pay | Source: Home / Self Care | Attending: Cardiology

## 2023-05-01 ENCOUNTER — Encounter (HOSPITAL_COMMUNITY): Payer: Self-pay | Admitting: Cardiology

## 2023-05-01 ENCOUNTER — Ambulatory Visit (HOSPITAL_BASED_OUTPATIENT_CLINIC_OR_DEPARTMENT_OTHER)
Admission: RE | Admit: 2023-05-01 | Discharge: 2023-05-01 | Disposition: A | Discharge status: Home / Self Care | Payer: Medicare Other | Source: Ambulatory Visit | Attending: Cardiology | Admitting: Cardiology

## 2023-05-01 ENCOUNTER — Ambulatory Visit (HOSPITAL_BASED_OUTPATIENT_CLINIC_OR_DEPARTMENT_OTHER): Admit: 2023-05-01 | Payer: Medicare Other | Admitting: Cardiology

## 2023-05-01 DIAGNOSIS — I361 Nonrheumatic tricuspid (valve) insufficiency: Secondary | ICD-10-CM | POA: Diagnosis not present

## 2023-05-01 DIAGNOSIS — I272 Pulmonary hypertension, unspecified: Secondary | ICD-10-CM | POA: Insufficient documentation

## 2023-05-01 DIAGNOSIS — I10 Essential (primary) hypertension: Secondary | ICD-10-CM | POA: Insufficient documentation

## 2023-05-01 DIAGNOSIS — I4891 Unspecified atrial fibrillation: Secondary | ICD-10-CM | POA: Insufficient documentation

## 2023-05-01 DIAGNOSIS — I110 Hypertensive heart disease with heart failure: Secondary | ICD-10-CM | POA: Insufficient documentation

## 2023-05-01 DIAGNOSIS — I482 Chronic atrial fibrillation, unspecified: Secondary | ICD-10-CM

## 2023-05-01 DIAGNOSIS — I509 Heart failure, unspecified: Secondary | ICD-10-CM | POA: Diagnosis not present

## 2023-05-01 DIAGNOSIS — I5032 Chronic diastolic (congestive) heart failure: Secondary | ICD-10-CM | POA: Diagnosis not present

## 2023-05-01 DIAGNOSIS — I071 Rheumatic tricuspid insufficiency: Secondary | ICD-10-CM | POA: Insufficient documentation

## 2023-05-01 DIAGNOSIS — Z953 Presence of xenogenic heart valve: Secondary | ICD-10-CM | POA: Insufficient documentation

## 2023-05-01 DIAGNOSIS — E785 Hyperlipidemia, unspecified: Secondary | ICD-10-CM | POA: Insufficient documentation

## 2023-05-01 HISTORY — PX: TRANSESOPHAGEAL ECHOCARDIOGRAM (CATH LAB): EP1270

## 2023-05-01 HISTORY — PX: RIGHT HEART CATH: CATH118263

## 2023-05-01 LAB — POCT I-STAT EG7
Acid-Base Excess: 5 mmol/L — ABNORMAL HIGH (ref 0.0–2.0)
Acid-Base Excess: 6 mmol/L — ABNORMAL HIGH (ref 0.0–2.0)
Bicarbonate: 29.6 mmol/L — ABNORMAL HIGH (ref 20.0–28.0)
Bicarbonate: 30.1 mmol/L — ABNORMAL HIGH (ref 20.0–28.0)
Calcium, Ion: 1.17 mmol/L (ref 1.15–1.40)
Calcium, Ion: 1.19 mmol/L (ref 1.15–1.40)
HCT: 37 % — ABNORMAL LOW (ref 39.0–52.0)
HCT: 37 % — ABNORMAL LOW (ref 39.0–52.0)
Hemoglobin: 12.6 g/dL — ABNORMAL LOW (ref 13.0–17.0)
Hemoglobin: 12.6 g/dL — ABNORMAL LOW (ref 13.0–17.0)
O2 Saturation: 76 %
O2 Saturation: 81 %
Potassium: 3.5 mmol/L (ref 3.5–5.1)
Potassium: 3.5 mmol/L (ref 3.5–5.1)
Sodium: 140 mmol/L (ref 135–145)
Sodium: 141 mmol/L (ref 135–145)
TCO2: 31 mmol/L (ref 22–32)
TCO2: 31 mmol/L (ref 22–32)
pCO2, Ven: 43 mm[Hg] — ABNORMAL LOW (ref 44–60)
pCO2, Ven: 44.1 mm[Hg] (ref 44–60)
pH, Ven: 7.436 — ABNORMAL HIGH (ref 7.25–7.43)
pH, Ven: 7.454 — ABNORMAL HIGH (ref 7.25–7.43)
pO2, Ven: 40 mm[Hg] (ref 32–45)
pO2, Ven: 43 mm[Hg] (ref 32–45)

## 2023-05-01 LAB — ECHO TEE
AV Mean grad: 13 mm[Hg]
AV Peak grad: 23.5 mm[Hg]
Ao pk vel: 2.43 m/s

## 2023-05-01 LAB — CBC
HCT: 40.6 % (ref 39.0–52.0)
Hemoglobin: 12.9 g/dL — ABNORMAL LOW (ref 13.0–17.0)
MCH: 30.6 pg (ref 26.0–34.0)
MCHC: 31.8 g/dL (ref 30.0–36.0)
MCV: 96.4 fL (ref 80.0–100.0)
Platelets: 81 10*3/uL — ABNORMAL LOW (ref 150–400)
RBC: 4.21 MIL/uL — ABNORMAL LOW (ref 4.22–5.81)
RDW: 16.1 % — ABNORMAL HIGH (ref 11.5–15.5)
WBC: 3.8 10*3/uL — ABNORMAL LOW (ref 4.0–10.5)
nRBC: 0 % (ref 0.0–0.2)

## 2023-05-01 SURGERY — TRANSESOPHAGEAL ECHOCARDIOGRAM (TEE) (CATHLAB)
Anesthesia: Monitor Anesthesia Care

## 2023-05-01 SURGERY — RIGHT HEART CATH
Anesthesia: LOCAL

## 2023-05-01 MED ORDER — DAPAGLIFLOZIN PROPANEDIOL 10 MG PO TABS: 10.0000 mg | ORAL_TABLET | Freq: Every day | ORAL | 11 refills | Status: DC

## 2023-05-01 MED ORDER — LIDOCAINE HCL (PF) 1 % IJ SOLN
INTRAMUSCULAR | Status: AC
  Filled 2023-05-01: qty 30

## 2023-05-01 MED ORDER — SODIUM CHLORIDE 0.9% FLUSH: 3.0000 mL | Freq: Two times a day (BID) | INTRAVENOUS | Status: DC

## 2023-05-01 MED ORDER — LIDOCAINE 2% (20 MG/ML) 5 ML SYRINGE
INTRAMUSCULAR | Status: DC | PRN
  Administered 2023-05-01: 100 mg via INTRAVENOUS

## 2023-05-01 MED ORDER — ACETAMINOPHEN 325 MG PO TABS: 650.0000 mg | ORAL_TABLET | ORAL | Status: DC | PRN

## 2023-05-01 MED ORDER — FENTANYL CITRATE (PF) 100 MCG/2ML IJ SOLN: 25.0000 ug | INTRAMUSCULAR | Status: DC | PRN

## 2023-05-01 MED ORDER — DROPERIDOL 2.5 MG/ML IJ SOLN
0.6250 mg | Freq: Once | INTRAMUSCULAR | Status: DC | PRN
  Filled 2023-05-01: qty 2

## 2023-05-01 MED ORDER — ONDANSETRON HCL 4 MG/2ML IJ SOLN: 4.0000 mg | Freq: Four times a day (QID) | INTRAMUSCULAR | Status: DC | PRN

## 2023-05-01 MED ORDER — EPHEDRINE SULFATE (PRESSORS) 50 MG/ML IJ SOLN
INTRAMUSCULAR | Status: DC | PRN
  Administered 2023-05-01 (×3): 5 mg via INTRAVENOUS

## 2023-05-01 MED ORDER — PHENYLEPHRINE HCL (PRESSORS) 10 MG/ML IV SOLN
INTRAVENOUS | Status: DC | PRN
  Administered 2023-05-01: 80 ug via INTRAVENOUS

## 2023-05-01 MED ORDER — PROPOFOL 500 MG/50ML IV EMUL
INTRAVENOUS | Status: DC | PRN
  Administered 2023-05-01: 50 ug/kg/min via INTRAVENOUS

## 2023-05-01 MED ORDER — PROPOFOL 10 MG/ML IV BOLUS
INTRAVENOUS | Status: DC | PRN
  Administered 2023-05-01: 40 mg via INTRAVENOUS
  Administered 2023-05-01: 60 mg via INTRAVENOUS

## 2023-05-01 MED ORDER — SODIUM CHLORIDE 0.9% FLUSH: 10.0000 mL | Freq: Two times a day (BID) | INTRAVENOUS | Status: DC

## 2023-05-01 MED ORDER — LIDOCAINE HCL (PF) 1 % IJ SOLN
INTRAMUSCULAR | Status: DC | PRN
  Administered 2023-05-01: 2 mL

## 2023-05-01 MED ORDER — SODIUM CHLORIDE 0.9% FLUSH: 3.0000 mL | INTRAVENOUS | Status: DC | PRN

## 2023-05-01 MED ORDER — SODIUM CHLORIDE 0.9 % IV SOLN
250.0000 mL | INTRAVENOUS | Status: DC | PRN
Start: 2023-05-01 — End: 2023-05-01

## 2023-05-01 MED ORDER — SODIUM CHLORIDE 0.9 % IV SOLN: INTRAVENOUS | Status: DC

## 2023-05-01 MED ORDER — HEPARIN (PORCINE) IN NACL 1000-0.9 UT/500ML-% IV SOLN
INTRAVENOUS | Status: DC | PRN
  Administered 2023-05-01: 500 mL

## 2023-05-01 MED ORDER — LABETALOL HCL 5 MG/ML IV SOLN: 10.0000 mg | INTRAVENOUS | Status: DC | PRN

## 2023-05-01 MED ORDER — HYDRALAZINE HCL 20 MG/ML IJ SOLN: 10.0000 mg | INTRAMUSCULAR | Status: DC | PRN

## 2023-05-01 SURGICAL SUPPLY — 5 items
CATH BALLN WEDGE 5F 110CM (CATHETERS) IMPLANT
PACK CARDIAC CATHETERIZATION (CUSTOM PROCEDURE TRAY) ×1 IMPLANT
SHEATH GLIDE SLENDER 4/5FR (SHEATH) IMPLANT
TRANSDUCER W/STOPCOCK (MISCELLANEOUS) IMPLANT
TUBING ART PRESS 72 MALE/FEM (TUBING) IMPLANT

## 2023-05-01 NOTE — Transfer of Care (Signed)
Immediate Anesthesia Transfer of Care Note  Patient: Devin Buckley  Procedure(s) Performed: TRANSESOPHAGEAL ECHOCARDIOGRAM  Patient Location: Endoscopy Unit  Anesthesia Type:MAC  Level of Consciousness: awake, alert , and oriented  Airway & Oxygen Therapy: Patient Spontanous Breathing and Patient connected to face mask oxygen  Post-op Assessment: Report given to RN and Post -op Vital signs reviewed and stable  Post vital signs: Reviewed and stable  Last Vitals:  Vitals Value Taken Time  BP    Temp    Pulse    Resp    SpO2      Last Pain:  Vitals:   05/01/23 0742  TempSrc: Temporal         Complications: No notable events documented.

## 2023-05-01 NOTE — Discharge Instructions (Addendum)
1. Continue taking torsemide 40 mg daily (2 tabs daily), can add extra tab if increased weight or swelling.  2. Take Farxiga 10 mg daily to help with congestive heart failure.   Brachial Site Care   This sheet gives you information about how to care for yourself after your procedure. Your health care provider may also give you more specific instructions. If you have problems or questions, contact your health care provider. What can I expect after the procedure? After the procedure, it is common to have: Bruising and tenderness at the catheter insertion area. Follow these instructions at home:  Insertion site care Follow instructions from your health care provider about how to take care of your insertion site. Make sure you: Wash your hands with soap and water before you change your bandage (dressing). If soap and water are not available, use hand sanitizer. Remove your dressing as told by your health care provider. In 24 hours Check your insertion site every day for signs of infection. Check for: Redness, swelling, or pain. Pus or a bad smell. Warmth. You may shower 24-48 hours after the procedure. Do not apply powder or lotion to the site.  Activity For 24 hours after the procedure, or as directed by your health care provider: Do not push or pull heavy objects with the affected arm. Do not drive yourself home from the hospital or clinic. You may drive 24 hours after the procedure unless your health care provider tells you not to. Do not lift anything that is heavier than 10 lb (4.5 kg), or the limit that you are told, until your health care provider says that it is safe.  For 24 hours

## 2023-05-01 NOTE — Progress Notes (Signed)
  Echocardiogram Echocardiogram Transesophageal has been performed.  [name] 05/01/2023, 9:30 AM

## 2023-05-01 NOTE — CV Procedure (Signed)
Procedure: TEE  Sedation: Per anesthesiology  Indication: Severe tricuspid regurgitation consider Triclip, s/p TAVR with elevated gradient across bioprosthetic aortic valve and concern for HALT/HAM.   Findings: Please see echo section for full report.  There is severe tricuspid regurgitation.  The mean gradient across the aortic valve is down to 13 mmHg and there is no obvious thrombus on the aortic valve.    Marca Ancona 05/01/2023 9:28 AM

## 2023-05-01 NOTE — Anesthesia Postprocedure Evaluation (Signed)
Anesthesia Post Note  Patient: Devin Buckley  Procedure(s) Performed: TRANSESOPHAGEAL ECHOCARDIOGRAM     Patient location during evaluation: PACU Anesthesia Type: MAC Level of consciousness: awake and alert Pain management: pain level controlled Vital Signs Assessment: post-procedure vital signs reviewed and stable Respiratory status: spontaneous breathing, nonlabored ventilation, respiratory function stable and patient connected to nasal cannula oxygen Cardiovascular status: stable and blood pressure returned to baseline Postop Assessment: no apparent nausea or vomiting Anesthetic complications: no   No notable events documented.  Last Vitals:  Vitals:   05/01/23 0742  BP: 126/77  Pulse: (!) 58  Resp: 14  Temp: 36.8 C  SpO2: 98%    Last Pain:  Vitals:   05/01/23 0742  TempSrc: Temporal                 Bartlett Nation

## 2023-05-01 NOTE — Anesthesia Preprocedure Evaluation (Signed)
Anesthesia Evaluation  Patient identified by MRN, date of birth, ID band Patient awake    Reviewed: Allergy & Precautions, H&P , NPO status , Patient's Chart, lab work & pertinent test results  Airway Mallampati: II   Neck ROM: full    Dental   Pulmonary neg pulmonary ROS   breath sounds clear to auscultation       Cardiovascular hypertension, pulmonary hypertension+ dysrhythmias Atrial Fibrillation + Valvular Problems/Murmurs AS  Rhythm:regular Rate:Normal  S/p tavr   1. Left ventricular ejection fraction, by estimation, is 60 to 65%. The  left ventricle has normal function. The left ventricle has no regional  wall motion abnormalities. There is mild left ventricular hypertrophy. .  There is the interventricular septum  is flattened in diastole ('D' shaped left ventricle), consistent with  right ventricular volume overload.   2. Right ventricular systolic function is mildly reduced. The right  ventricular size is severely enlarged. There is moderately elevated  pulmonary artery systolic pressure. The estimated right ventricular  systolic pressure is 50.0 mmHg.   3. Right atrial size was severely dilated.   4. The mitral valve is normal in structure. Trivial mitral valve  regurgitation. No evidence of mitral stenosis.   5. The aortic valve has been repaired/replaced. Aortic valve  regurgitation is not visualized. No aortic stenosis is present. There is a  29 mm Sapien prosthetic (TAVR) valve present in the aortic position.  Procedure Date: 02/28/2022. Aortic valve mean  gradient measures 18.0 mmHg. Aortic valve Vmax measures 2.81 m/s.   6. The inferior vena cava is dilated in size with <50% respiratory  variability, suggesting right atrial pressure of 15 mmHg.   7. Tricuspid valve regurgitation is severe.     Neuro/Psych  PSYCHIATRIC DISORDERS Anxiety        GI/Hepatic   Endo/Other    Renal/GU       Musculoskeletal  (+) Arthritis ,    Abdominal   Peds  Hematology   Anesthesia Other Findings   Reproductive/Obstetrics                              Anesthesia Physical Anesthesia Plan  ASA: 4  Anesthesia Plan: MAC   Post-op Pain Management:    Induction: Intravenous  PONV Risk Score and Plan: 1 and Propofol infusion, Treatment may vary due to age or medical condition and Ondansetron  Airway Management Planned: Simple Face Mask  Additional Equipment:   Intra-op Plan:   Post-operative Plan:   Informed Consent: I have reviewed the patients History and Physical, chart, labs and discussed the procedure including the risks, benefits and alternatives for the proposed anesthesia with the patient or authorized representative who has indicated his/her understanding and acceptance.     Dental advisory given  Plan Discussed with: CRNA, Anesthesiologist and Surgeon  Anesthesia Plan Comments: ( 82 y.o. with history of permanent atrial fibrillation, aortic stenosis s/p TAVR, and CHF was referred for evaluation of CHF by [name], NP.  In 9/23, patient had TAVR for severe AS with 29 mm Edwards Sapien 3 THV.  Prior to TAVR, he had LHC in 8/23 showing minimal CAD.  Initial post-TAVR echo in 10/23 showed EF 60-65%, normal RV, trivial MR, TAVR valve with no PVL and mean gradient 15 mmHg.  Repeat echo was done in 9/24, this showed development of RV failure and severe TR; EF 60-65%, mild LVH, D-shaped interventricular septum with severe RV enlargement, mild RV dysfunction,  PASP 50 mmHg, TAVR valve with mean gradient 18 mmHg, mild MR, severe TR, dilated IVC. Of note, patient has not been taking Eliquis.  He has been concerned about bruising.  Due to development of exertional dyspnea over the last few months, he has been taking torsemide.  Initially, he was taking torsemide 40 mg bid. Currently taking torsemide 30 mg daily.  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  He walks up to a mile on his treadmill with no dyspnea.  However, he does get short of breath walking up hills/inclines or doing heavier activity.  No orthopnea/PND.  No chest pain. No lightheadedness.  Weight has gone down with torsemide.    )         Anesthesia Quick Evaluation

## 2023-05-01 NOTE — Interval H&P Note (Signed)
History and Physical Interval Note:  05/01/2023 8:45 AM  Devin Buckley  has presented today for surgery, with the diagnosis of EVALUATE TRICUSPID VALVE.  The various methods of treatment have been discussed with the patient and family. After consideration of risks, benefits and other options for treatment, the patient has consented to  Procedure(s): TRANSESOPHAGEAL ECHOCARDIOGRAM (N/A) as a surgical intervention.  The patient's history has been reviewed, patient examined, no change in status, stable for surgery.  I have reviewed the patient's chart and labs.  Questions were answered to the patient's satisfaction.     Arian Murley [employer]

## 2023-05-02 ENCOUNTER — Encounter (HOSPITAL_COMMUNITY): Payer: Self-pay | Admitting: Cardiology

## 2023-05-03 ENCOUNTER — Telehealth (HOSPITAL_COMMUNITY): Payer: Self-pay

## 2023-05-03 NOTE — Telephone Encounter (Signed)
Spoke with patient- he requested these records for personal use.   Advised patient to reach out to medical records for [place]. Provided number to patient. Patient aware and verbalized understanding.

## 2023-05-03 NOTE — Telephone Encounter (Signed)
Received call from patient who left message stating that he  needs his records from procedure done on Tuesday this week (cath/TEE) faxed to him email.   Attempted to call patient- unable to reach. Left message to call back.

## 2023-05-04 ENCOUNTER — Ambulatory Visit (HOSPITAL_COMMUNITY)
Admission: RE | Admit: 2023-05-04 | Discharge: 2023-05-04 | Disposition: A | Discharge status: Home / Self Care | Payer: Medicare Other | Source: Ambulatory Visit | Attending: Cardiology | Admitting: Cardiology

## 2023-05-04 ENCOUNTER — Encounter (HOSPITAL_COMMUNITY): Payer: Self-pay | Admitting: Cardiology

## 2023-05-04 VITALS — BP 110/60 | HR 66 | Wt 196.2 lb

## 2023-05-04 DIAGNOSIS — I5032 Chronic diastolic (congestive) heart failure: Secondary | ICD-10-CM | POA: Diagnosis not present

## 2023-05-04 DIAGNOSIS — I110 Hypertensive heart disease with heart failure: Secondary | ICD-10-CM | POA: Insufficient documentation

## 2023-05-04 DIAGNOSIS — R9431 Abnormal electrocardiogram [ECG] [EKG]: Secondary | ICD-10-CM | POA: Insufficient documentation

## 2023-05-04 DIAGNOSIS — I5042 Chronic combined systolic (congestive) and diastolic (congestive) heart failure: Secondary | ICD-10-CM

## 2023-05-04 DIAGNOSIS — I4821 Permanent atrial fibrillation: Secondary | ICD-10-CM | POA: Diagnosis not present

## 2023-05-04 DIAGNOSIS — Z7901 Long term (current) use of anticoagulants: Secondary | ICD-10-CM | POA: Insufficient documentation

## 2023-05-04 DIAGNOSIS — Z952 Presence of prosthetic heart valve: Secondary | ICD-10-CM | POA: Insufficient documentation

## 2023-05-04 DIAGNOSIS — I361 Nonrheumatic tricuspid (valve) insufficiency: Secondary | ICD-10-CM | POA: Insufficient documentation

## 2023-05-04 LAB — BASIC METABOLIC PANEL
Anion gap: 8 (ref 5–15)
BUN: 30 mg/dL — ABNORMAL HIGH (ref 8–23)
CO2: 31 mmol/L (ref 22–32)
Calcium: 9 mg/dL (ref 8.9–10.3)
Chloride: 101 mmol/L (ref 98–111)
Creatinine, Ser: 1.39 mg/dL — ABNORMAL HIGH (ref 0.61–1.24)
GFR, Estimated: 51 mL/min — ABNORMAL LOW (ref >60)
Glucose, Bld: 112 mg/dL — ABNORMAL HIGH (ref 70–99)
Potassium: 4.3 mmol/L (ref 3.5–5.1)
Sodium: 140 mmol/L (ref 135–145)

## 2023-05-04 LAB — BRAIN NATRIURETIC PEPTIDE: B Natriuretic Peptide: 493.8 pg/mL — ABNORMAL HIGH (ref 0.0–100.0)

## 2023-05-04 NOTE — Progress Notes (Signed)
PCP: Danella Penton, MD Cardiology: Previously Eldridge Dace HF Cardiology: Dr. Shirlee Latch  82 y.o. with history of permanent atrial fibrillation, aortic stenosis s/p TAVR, and CHF was referred for evaluation of CHF by Devin Chard, NP.  In 9/23, patient had TAVR for severe AS with 29 mm Edwards Sapien 3 THV.  Prior to TAVR, he had LHC in 8/23 showing minimal CAD.  Initial post-TAVR echo in 10/23 showed EF 60-65%, normal RV, trivial MR, TAVR valve with no PVL and mean gradient 15 mmHg.  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 Of note, patient was not taking Eliquis at at time of 9/24 echo.  He had been concerned about bruising.  He started taking apixaban in 10/24.   TEE was done in 11/24, showing EF 55-60%, mild LVH, mildly D-shaped septum with severe RV enlargement but normal RV systolic function, severe biatrial enlargement, severe TR (suspect atrial functional TR), bioprosthetic aortic valve s/p TAVR with no evidence for thrombus/pannus, trivial perivalvular leakage, and mean gradient down to 13 mmHg.  RHC in 11/24 showed elevated right-sided filling pressures with mild pulmonary hypertension and preserved PAPi. Torsemide was increased to 40 qam/20 qpm.   Patient returns for followup of CHF/RV failure.  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  He walks up to a mile on his treadmill with no dyspnea three times/week.  However, he does get short of breath walking up hills/inclines or doing heavier activity.  No orthopnea/PND.  No lightheadedness.  No chest pain.  He has not started [name] yet.   ECG (personally reviewed): Atrial fibrillation, rate 78  Labs (10/24): LDL 93, K 4.1, creatinine 1.1 => 1.28, hgb 13.7, BNP 485  PMH: 1. HTN 2.  Hyperlipidemia 3. Aortic stenosis: Severe. S/p TAVR in 9/23 with 29 mm Edwards Sapien 3 THV.  - Echo (10/23): EF 60-65%, normal RV, trivial MR, TAVR valve with no PVL and mean gradient 15 mmHg.  - Echo (9/24): EF 60-65%, mild LVH, D-shaped interventricular septum with severe RV enlargement, mild RV dysfunction, PASP 50 mmHg, TAVR valve with mean gradient 18 mmHg, mild MR, severe TR, dilated IVC.  - TEE (11/24): EF 55-60%, mild LVH, mildly D-shaped septum with severe RV enlargement but normal RV systolic function, severe biatrial enlargement, severe TR (suspect atrial functional TR), bioprosthetic aortic valve s/p TAVR with no evidence for thrombus/pannus, trivial perivalvular leakage, and mean gradient down to 13 mmHg.  4. Tricuspid regurgitation: Severe.  Noted first on 9/24 echo.  TEE (11/24): EF 55-60%, mild LVH, mildly D-shaped septum with severe RV enlargement but normal RV systolic function, severe biatrial enlargement, severe TR (suspect atrial functional TR), bioprosthetic aortic valve s/p TAVR with no evidence for thrombus/pannus, trivial perivalvular leakage, and mean gradient down to 13 mmHg.  - RHC (11/24): Mean RA 10 with v-waves to 19; PA 42/14, mean 28; mean PCWP 10, CI 4.74, PVR 1.7 WU, PAPi 2.8.  5. LHC (8/23) with minimal CAD (pre-TAVR).  6. 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   Family History  Problem Relation Age of Onset   Colon cancer Mother    Alzheimer's disease Father    CVA Father    Diabetes Brother    ROS:  All systems reviewed and negative except as per HPI.   Current Outpatient Medications  Medication Sig Dispense Refill   amoxicillin (AMOXIL) 500 MG tablet Take 4 tablets (2,000 mg total) by mouth as directed. 1 HOUR PRIOR TO DENTAL APPOINTMENTS 12 tablet 6   apixaban (ELIQUIS) 5 MG TABS tablet Take 1 tablet (5 mg total) by mouth 2 (two) times daily. 60 tablet 11    buprenorphine-naloxone (SUBOXONE) 8-2 mg SUBL SL tablet Place 2 tablets under the tongue daily.     clonazePAM (KLONOPIN) 0.5 MG tablet Take 0.5 mg by mouth 3 (three) times daily as needed for anxiety.     Collagen Hydrolysate POWD Take 3 applicators by mouth 2 (two) times daily.     dapagliflozin propanediol (FARXIGA) 10 MG TABS tablet Take 1 tablet (10 mg total) by mouth daily before breakfast. Please take this 30 tablet 11   Digestive Enzyme CAPS Take 1 capsule by mouth 2 (two) times daily.     Multiple Vitamin (MULTIVITAMIN WITH MINERALS) TABS tablet Take 3 tablets by mouth daily. Total minerals     potassium chloride (MICRO-K) 10 MEQ CR capsule Take 30 mEq by mouth in the morning, at noon, and at bedtime.     POTASSIUM PO Take 1 tablet by mouth 2 (two) times daily.     Probiotic Product (PROBIOTIC PO) Take 1 capsule by mouth 2 (two) times daily. total flora     testosterone cypionate (DEPOTESTOSTERONE CYPIONATE) 200 MG/ML injection Inject 300 mg into the muscle every 28 (twenty-eight) days.     torsemide (DEMADEX) 20 MG tablet Take 2 tablets (40 mg total) by mouth daily. 180 tablet 3   No current facility-administered medications for this encounter.   BP 110/60   Pulse 66   Wt 89 kg (196 lb 3.2 oz)   SpO2 97%   BMI 25.19 kg/m  General: NAD Neck: JVP 8-9 cm with prominent CV waves, no thyromegaly or thyroid nodule.  Lungs: Clear to auscultation bilaterally with normal respiratory effort. CV: Nondisplaced PMI.  Heart irregular S1/S2, no S3/S4, 3/6 HSM LLSB.  1+ ankle edema.  No carotid bruit.  Normal pedal pulses.  Abdomen: Soft, nontender, no hepatosplenomegaly, no distention.  Skin: Intact without lesions or rashes.  Neurologic: Alert and oriented x 3.  Psych: Normal affect. Extremities: No clubbing or cyanosis.  HEENT: Normal.   Assessment/Plan: 1. Atrial fibrillation: This is permanent x years with dilated atria.  CHADSVASC = 3 (age, CHF).  He has not wanted to take Eliquis due  to history of easy bruising.  We had a long discussion about this at last appointment.  I do not think he would be an ideal candidate for Watchman alone, as I have some concern about the rising mean gradient across his bioprosthetic aortic valve on 9/24 echo and worry about the possibility of subclinical valve thrombosis (HALT/HAM) development.  He started apixaban in 10/24, and TEE in 11/24 showed no evidence for thrombus/pannus on the aortic valve and mean gradient was down to 13 mmHg.  Rate control is reasonable without a nodal blocker.  - He should continue apixaban.  2. Aortic stenosis s/p TAVR: Mean gradient across his bioprosthetic valve increased over time, echo in 9/24 with mean gradient 18 mmHg. He was started back on apixaban, and as above, TEE in 11/24 showed mean gradient back down to 13 mmHg (suggesting that he may have had some degree of subacute valve thrombosis).   - Continue apixaban.   3. HTN: BP is  not significantly elevated.  4. Chronic diastolic CHF: Prominent RV dysfunction with severe TR.  This is a new finding on the 9/24 echo compared to 10/23 echo.  Echo in 9/24 showed EF 60-65%, mild LVH, D-shaped interventricular septum with severe RV enlargement, mild RV dysfunction, PASP 50 mmHg, TAVR valve with mean gradient 18 mmHg, mild MR, severe TR, dilated IVC. TEE in 11/24 showed EF 55-60%, mild LVH, mildly D-shaped septum with severe RV enlargement but normal RV systolic function, severe biatrial enlargement, severe TR (suspect atrial functional TR), bioprosthetic aortic valve s/p TAVR with no evidence for thrombus/pannus, trivial perivalvular leakage, and mean gradient down to 13 mmHg.   RHC in 11/24 showed signs of RV failure with elevated RA pressure though PAPi was preserved at 2.8.   I think his symptoms and volume overload are primarily due to RV failure.  Torsemide recently increased, still appears to have at least mild volume overload.  - Continue torsemide 40 qam/20 qpm, BMET/BNP  today.  - I still want him to start Farxiga 10 mg daily. We discussed this again today.  BMET 10 days after starting [name].  - Continue KCl 20 bid.  5. Tricuspid regurgitation: This is severe on last echo in 9/24 with dilated RV and D-shaped interventricular septum.  TEE in 11/24 showed severe TR, possibly atrial functional TR with dilated right atrium. The RV on 11/24 TEE is severely dilated though overall systolic function appears normal.  - I would like him to be evaluated for percutaneous tricuspid valve repair, will refer to Dr. Art Buff at [facility] for evaluation.   Followup with me in 6 wks.   Marca Ancona 05/04/2023

## 2023-05-04 NOTE — Patient Instructions (Addendum)
There has been no changes to your medications.  Labs done today, your results will be available in MyChart, we will contact you for abnormal readings.  Repeat blood work in 10 days.  Your physician recommends that you schedule a follow-up appointment in: 6 weeks  If you have any questions or concerns before your next appointment please send [place] a message through [place] or call our office at [phone].    TO LEAVE A MESSAGE FOR THE NURSE SELECT OPTION 2, PLEASE LEAVE A MESSAGE INCLUDING: YOUR NAME DATE OF BIRTH CALL BACK NUMBER REASON FOR CALL**this is important as we prioritize the call backs  YOU WILL RECEIVE A CALL BACK THE SAME DAY AS LONG AS YOU CALL BEFORE 4:00 PM  At the [facility], you and your health needs are our priority. As part of our continuing mission to provide you with exceptional heart care, we have created designated Provider Care Teams. These Care Teams include your primary Cardiologist (physician) and Advanced Practice Providers (APPs- Physician Assistants and Nurse Practitioners) who all work together to provide you with the care you need, when you need it.   You may see any of the following providers on your designated Care Team at your next follow up: Dr Arvilla Meres Dr Marca Ancona Dr. Dorthula Nettles Dr. Clearnce Hasten Amy Filbert Schilder, NP Robbie Lis, [place] [facility] [place], [place] Brynda Peon, NP [place] Lee, NP Karle Plumber, PharmD   Please be sure to bring in all your medications bottles to every appointment.    Thank you for choosing [facility]

## 2023-05-09 ENCOUNTER — Telehealth (HOSPITAL_COMMUNITY): Payer: Self-pay

## 2023-05-09 NOTE — Telephone Encounter (Signed)
Referral faxed to Dr. Art Buff at [phone] on 05/09/23

## 2023-05-14 ENCOUNTER — Other Ambulatory Visit (HOSPITAL_COMMUNITY): Payer: Medicare Other

## 2023-05-22 ENCOUNTER — Other Ambulatory Visit (HOSPITAL_COMMUNITY): Payer: Medicare Other

## 2023-06-07 ENCOUNTER — Ambulatory Visit (HOSPITAL_COMMUNITY)
Admission: RE | Admit: 2023-06-07 | Discharge: 2023-06-07 | Disposition: A | Discharge status: Home / Self Care | Payer: Medicare Other | Source: Ambulatory Visit | Attending: Cardiology | Admitting: Cardiology

## 2023-06-07 DIAGNOSIS — I5042 Chronic combined systolic (congestive) and diastolic (congestive) heart failure: Secondary | ICD-10-CM | POA: Diagnosis present

## 2023-06-07 LAB — BASIC METABOLIC PANEL
Anion gap: 6 (ref 5–15)
BUN: 37 mg/dL — ABNORMAL HIGH (ref 8–23)
CO2: 30 mmol/L (ref 22–32)
Calcium: 8.7 mg/dL — ABNORMAL LOW (ref 8.9–10.3)
Chloride: 103 mmol/L (ref 98–111)
Creatinine, Ser: 1.72 mg/dL — ABNORMAL HIGH (ref 0.61–1.24)
GFR, Estimated: 39 mL/min — ABNORMAL LOW (ref >60)
Glucose, Bld: 130 mg/dL — ABNORMAL HIGH (ref 70–99)
Potassium: 4.3 mmol/L (ref 3.5–5.1)
Sodium: 139 mmol/L (ref 135–145)

## 2023-06-14 ENCOUNTER — Other Ambulatory Visit (HOSPITAL_COMMUNITY): Payer: Medicare Other

## 2023-06-15 ENCOUNTER — Encounter (HOSPITAL_COMMUNITY): Payer: Medicare Other

## 2023-06-19 ENCOUNTER — Telehealth (HOSPITAL_COMMUNITY): Payer: Self-pay | Admitting: Cardiology

## 2023-06-19 NOTE — Telephone Encounter (Signed)
 Patient called.  Patient aware.

## 2023-06-19 NOTE — Telephone Encounter (Signed)
-----   Message from Marca Ancona sent at 06/07/2023  1:43 PM EST ----- Creatinine higher.  Would decrease torsemide to 40 mg once daily if he has been taking 40 qam/20 qpm.  BMET in 10 days.

## 2023-06-29 NOTE — Progress Notes (Addendum)
 PCP: Sari Cunning, MD Cardiology: Previously Jacquelynn Matter HF Cardiology: Dr. Mitzie Anda  CC: CHF/RV failure follow up  83 y.o. with history of permanent atrial fibrillation, aortic stenosis s/p TAVR, and CHF was referred for evaluation of CHF by Drema Genta, NP.  In 9/23, patient had TAVR for severe AS with 29 mm Edwards Sapien 3 THV.  Prior to TAVR, he had LHC in 8/23 showing minimal CAD.  Initial post-TAVR echo in 10/23 showed EF 60-65%, normal RV, trivial MR, TAVR valve with no PVL and mean gradient 15 mmHg.  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 Of note, patient was not taking Eliquis  at at time of 9/24 echo.  He had been concerned about bruising.  He started taking apixaban  in 10/24.   TEE was done in 11/24, showing EF 55-60%, mild LVH, mildly D-shaped septum with severe RV enlargement but normal RV systolic function, severe biatrial enlargement, severe TR (suspect atrial functional TR), bioprosthetic aortic valve s/p TAVR with no evidence for thrombus/pannus, trivial perivalvular leakage, and mean gradient down to 13 mmHg.  RHC in 11/24 showed elevated right-sided filling pressures with mild pulmonary hypertension and preserved PAPi. Torsemide  was increased to 40 qam/20 qpm.   Follow up 11/24, mildly volume overloaded. Farxiga  started, referred to [facility] for percutaneous tricuspid repair consultation.  Today he returns for CHF/RF failure follow up with his wife. Overall feeling fine, main complaint is LEE. No dyspnea with daily work outs (does treadmill and weights). Has not started Farxiga  yet. Denies palpitations, abnormal bleeding, CP, dizziness,, or PND/Orthopnea. Appetite ok. No fever or chills. 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  ReDs: 36%  ECG (personally reviewed): none ordered today.  Labs (10/24): LDL 93, K 4.1, creatinine 1.1 => 1.28, hgb 13.7, BNP 485 Labs (12/24): K 4.3, creatinine 1.72  PMH: 1. HTN 2. Hyperlipidemia 3. Aortic stenosis: Severe. S/p TAVR in 9/23 with 29 mm Edwards Sapien 3 THV.  - Echo (10/23): EF 60-65%, normal RV, trivial MR, TAVR valve with no PVL and mean gradient 15 mmHg.  - Echo (9/24): EF 60-65%, mild LVH, D-shaped interventricular septum with severe RV enlargement, mild RV dysfunction, PASP 50 mmHg, TAVR valve with mean gradient 18 mmHg, mild MR, severe TR, dilated IVC.  - TEE (11/24): EF 55-60%, mild LVH, mildly D-shaped septum with severe RV enlargement but normal RV systolic function, severe biatrial enlargement, severe TR (suspect atrial functional TR), bioprosthetic aortic valve s/p TAVR with no evidence for thrombus/pannus, trivial perivalvular leakage, and mean gradient down to 13 mmHg.  4. Tricuspid regurgitation: Severe.  Noted first on 9/24 echo.  TEE (11/24): EF 55-60%, mild LVH, mildly D-shaped septum with severe RV enlargement but normal RV systolic function, severe biatrial enlargement, severe TR (suspect atrial functional TR), bioprosthetic aortic valve s/p TAVR with no evidence for thrombus/pannus, trivial perivalvular leakage, and mean gradient down to 13 mmHg.  - RHC (11/24): Mean RA 10 with v-waves to 19; PA 42/14, mean 28; mean PCWP 10, CI 4.74, PVR 1.7 WU, PAPi 2.8.  5. LHC (8/23) with minimal CAD (pre-TAVR).  6. 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   Family History  Problem Relation Age of Onset   Colon cancer  Mother    Alzheimer's disease Father    CVA Father    Diabetes Brother    ROS: All systems reviewed and negative except as per HPI.   Current Outpatient Medications  Medication Sig Dispense Refill   amoxicillin  (AMOXIL ) 500 MG tablet Take 4 tablets (2,000 mg  total) by mouth as directed. 1 HOUR PRIOR TO DENTAL APPOINTMENTS 12 tablet 6   apixaban  (ELIQUIS ) 5 MG TABS tablet Take 1 tablet (5 mg total) by mouth 2 (two) times daily. 60 tablet 11   buprenorphine -naloxone  (SUBOXONE ) 8-2 mg SUBL SL tablet Place 2 tablets under the tongue daily.     clonazePAM  (KLONOPIN ) 0.5 MG tablet Take 0.5 mg by mouth 3 (three) times daily as needed for anxiety.     Collagen Hydrolysate POWD Take 3 applicators by mouth 2 (two) times daily.     dapagliflozin  propanediol (FARXIGA ) 10 MG TABS tablet Take 1 tablet (10 mg total) by mouth daily before breakfast. Please take this 30 tablet 11   Digestive Enzyme CAPS Take 1 capsule by mouth 2 (two) times daily.     Multiple Vitamin (MULTIVITAMIN WITH MINERALS) TABS tablet Take 3 tablets by mouth daily. Total minerals     potassium chloride  (MICRO-K ) 10 MEQ CR capsule Take 20 mEq by mouth in the morning, at noon, and at bedtime.     Probiotic Product (PROBIOTIC PO) Take 1 capsule by mouth 2 (two) times daily. total flora     testosterone  cypionate (DEPOTESTOSTERONE CYPIONATE) 200 MG/ML injection Inject 300 mg into the muscle every 28 (twenty-eight) days.     torsemide  (DEMADEX ) 20 MG tablet Take 2 tablets (40 mg total) by mouth daily. 180 tablet 3   No current facility-administered medications for this encounter.   Wt Readings from Last 3 Encounters:  07/04/23 92.6 kg (204 lb 3.2 oz)  05/04/23 89 kg (196 lb 3.2 oz)  05/01/23 90.7 kg (200 lb)   BP 124/66   Pulse 76   Wt 92.6 kg (204 lb 3.2 oz)   SpO2 97%   BMI 26.22 kg/m  Physical Exam General:  NAD. No resp difficulty, appears younger than stated ge. HEENT: Normal Neck: Supple. No JVD, + CV waves. Carotids 2+ bilat; no bruits. No lymphadenopathy or thryomegaly appreciated. Cor: PMI nondisplaced. Irregular rate & rhythm. No rubs, gallops, 3/6 HSM LLSB Lungs: Clear Abdomen: Soft, nontender, nondistended. No hepatosplenomegaly. No bruits or masses. Good bowel  sounds. Extremities: No cyanosis, clubbing, rash, 2-3+ BLE edema to knees Neuro: Alert & oriented x 3, cranial nerves grossly intact. Moves all 4 extremities w/o difficulty. Affect pleasant.  Assessment/Plan: 1. Atrial fibrillation: This is permanent x years with dilated atria.  CHADSVASC = 3 (age, CHF).  He has not wanted to take Eliquis  due to history of easy bruising.  We had a long discussion about this at last appointment.  I do not think he would be an ideal candidate for Watchman alone, as I have some concern about the rising mean gradient across his bioprosthetic aortic valve on 9/24 echo and worry about the possibility of subclinical valve thrombosis (HALT/HAM) development.  He started apixaban  in 10/24, and TEE in 11/24 showed no evidence for thrombus/pannus on the aortic valve and mean gradient was down to 13 mmHg.  Rate control is reasonable without a nodal blocker.  - He should continue apixaban .  2. Aortic stenosis s/p TAVR: Mean gradient across his bioprosthetic valve increased over time, echo in 9/24 with mean gradient 18 mmHg. He was started  back on apixaban , and as above, TEE in 11/24 showed mean gradient back down to 13 mmHg (suggesting that he may have had some degree of subacute valve thrombosis).   - Continue apixaban .  No bleeding issues 3. HTN: BP well-controlled. 4. Chronic diastolic CHF: Prominent RV dysfunction with severe TR.  This is a new finding on the 9/24 echo compared to 10/23 echo.  Echo in 9/24 showed EF 60-65%, mild LVH, D-shaped interventricular septum with severe RV enlargement, mild RV dysfunction, PASP 50 mmHg, TAVR valve with mean gradient 18 mmHg, mild MR, severe TR, dilated IVC. TEE in 11/24 showed EF 55-60%, mild LVH, mildly D-shaped septum with severe RV enlargement but normal RV systolic function, severe biatrial enlargement, severe TR (suspect atrial functional TR), bioprosthetic aortic valve s/p TAVR with no evidence for thrombus/pannus, trivial perivalvular  leakage, and mean gradient down to 13 mmHg.   RHC in 11/24 showed signs of RV failure with elevated RA pressure though PAPi was preserved at 2.8. NYHA I-II and he is volume overloaded today. ReDs 36%, so R>L HF.  I think his symptoms and volume overload are primarily due to RV failure. Unclear why he has not started Farxiga . - Restart Farxiga  10 mg daily. He and wife are agreeable to start. - Continue torsemide  40 mg daily. BMET and BNP today. - Continue Farxiga  10 mg daily. - Continue KCl 20 bid.  - Place compression hose. 5. Tricuspid regurgitation: This is severe on last echo in 9/24 with dilated RV and D-shaped interventricular septum.  TEE in 11/24 showed severe TR, possibly atrial functional TR with dilated right atrium. The RV on 11/24 TEE is severely dilated though overall systolic function appears normal.  - He has been referred to Dr. Konrad Persia at [facility] for evaluation for percutaneous tricuspid valve repair. He has an appt next week.  Follow up in 6 weeks with Dr. America Bake [facility] FNP-BC 07/04/2023

## 2023-07-04 ENCOUNTER — Encounter (HOSPITAL_COMMUNITY): Payer: Self-pay

## 2023-07-04 ENCOUNTER — Ambulatory Visit (HOSPITAL_COMMUNITY)
Admission: RE | Admit: 2023-07-04 | Discharge: 2023-07-04 | Disposition: A | Discharge status: Home / Self Care | Payer: Medicare Other | Source: Ambulatory Visit | Attending: Family Medicine

## 2023-07-04 VITALS — BP 124/66 | HR 76 | Wt 204.2 lb

## 2023-07-04 DIAGNOSIS — I110 Hypertensive heart disease with heart failure: Secondary | ICD-10-CM | POA: Diagnosis present

## 2023-07-04 DIAGNOSIS — I5032 Chronic diastolic (congestive) heart failure: Secondary | ICD-10-CM

## 2023-07-04 DIAGNOSIS — I4821 Permanent atrial fibrillation: Secondary | ICD-10-CM

## 2023-07-04 DIAGNOSIS — Z952 Presence of prosthetic heart valve: Secondary | ICD-10-CM

## 2023-07-04 DIAGNOSIS — I2510 Atherosclerotic heart disease of native coronary artery without angina pectoris: Secondary | ICD-10-CM | POA: Insufficient documentation

## 2023-07-04 DIAGNOSIS — I071 Rheumatic tricuspid insufficiency: Secondary | ICD-10-CM

## 2023-07-04 DIAGNOSIS — I10 Essential (primary) hypertension: Secondary | ICD-10-CM | POA: Diagnosis not present

## 2023-07-04 DIAGNOSIS — I082 Rheumatic disorders of both aortic and tricuspid valves: Secondary | ICD-10-CM | POA: Insufficient documentation

## 2023-07-04 DIAGNOSIS — Z7901 Long term (current) use of anticoagulants: Secondary | ICD-10-CM | POA: Diagnosis not present

## 2023-07-04 LAB — BASIC METABOLIC PANEL
Anion gap: 6 (ref 5–15)
BUN: 37 mg/dL — ABNORMAL HIGH (ref 8–23)
CO2: 32 mmol/L (ref 22–32)
Calcium: 8.4 mg/dL — ABNORMAL LOW (ref 8.9–10.3)
Chloride: 98 mmol/L (ref 98–111)
Creatinine, Ser: 1.33 mg/dL — ABNORMAL HIGH (ref 0.61–1.24)
GFR, Estimated: 54 mL/min — ABNORMAL LOW (ref >60)
Glucose, Bld: 128 mg/dL — ABNORMAL HIGH (ref 70–99)
Potassium: 4.1 mmol/L (ref 3.5–5.1)
Sodium: 136 mmol/L (ref 135–145)

## 2023-07-04 LAB — BRAIN NATRIURETIC PEPTIDE: B Natriuretic Peptide: 425.2 pg/mL — ABNORMAL HIGH (ref 0.0–100.0)

## 2023-07-04 NOTE — Progress Notes (Signed)
 ReDS Vest / Clip - 07/04/23 1500       ReDS Vest / Clip   Station Marker C    Ruler Value 30    ReDS Value Range Moderate volume overload    ReDS Actual Value 36

## 2023-07-04 NOTE — Patient Instructions (Addendum)
 Thank you for coming in today  If you had labs drawn today, any labs that are abnormal the clinic will call you No news is good news  Medications: Farxiga  10 mg 1 tablet daily   Follow up appointments:  Your physician recommends that you schedule a follow-up appointment in:  6 weeks With Dr. Mitzie Anda    Do the following things EVERYDAY: Weigh yourself in the morning before breakfast. Write it down and keep it in a log. Take your medicines as prescribed Eat low salt foods--Limit salt (sodium) to 2000 mg per day.  Stay as active as you can everyday Limit all fluids for the day to less than 2 liters   At the [facility], you and your health needs are our priority. As part of our continuing mission to provide you with exceptional heart care, we have created designated Provider Care Teams. These Care Teams include your primary Cardiologist (physician) and Advanced Practice Providers (APPs- Physician Assistants and Nurse Practitioners) who all work together to provide you with the care you need, when you need it.   You may see any of the following providers on your designated Care Team at your next follow up: Dr Jules Oar Dr Peder Bourdon Dr. Mimi Alt, NP Ruddy Corral, [place] [facility] [place], [place] Dennise Fitz, NP Luster Salters, PharmD   Please be sure to bring in all your medications bottles to every appointment.    Thank you for choosing [facility]  If you have any questions or concerns before your next appointment please send us  a message through [place] or call our office at [phone].    TO LEAVE A MESSAGE FOR THE NURSE SELECT OPTION 2, PLEASE LEAVE A MESSAGE INCLUDING: YOUR NAME DATE OF BIRTH CALL BACK NUMBER REASON FOR CALL**this is important as we prioritize the call backs  YOU WILL RECEIVE A CALL BACK THE SAME DAY AS LONG AS YOU CALL BEFORE 4:00 PM

## 2023-08-13 ENCOUNTER — Telehealth (HOSPITAL_COMMUNITY): Payer: Self-pay | Admitting: Cardiology

## 2023-08-13 NOTE — Progress Notes (Signed)
 Advanced Heart Failure Clinic Note     PCP: Danella Penton, MD Cardiology: Previously [facility] HF Cardiology: Dr. Shirlee Latch  CC: Worsening LEE and SOB   83 y.o. with history of permanent atrial fibrillation, aortic stenosis s/p TAVR, and CHF. In 9/23, patient had TAVR for severe AS with 29 mm Edwards Sapien 3 THV.  Prior to TAVR, he had LHC in 8/23 showing minimal CAD.  Initial post-TAVR echo in 10/23 showed EF 60-65%, normal RV, trivial MR, TAVR valve with no PVL and mean gradient 15 mmHg.  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 Of note, patient was not taking Eliquis at at time of 9/24 echo.  He had been concerned about bruising.  He started taking apixaban in 10/24.   TEE was done in 11/24, showing EF 55-60%, mild LVH, mildly D-shaped septum with severe RV enlargement but normal RV systolic function, severe biatrial enlargement, severe TR (suspect atrial functional TR), bioprosthetic aortic valve s/p TAVR with no evidence for thrombus/pannus, trivial perivalvular leakage, and mean gradient down to 13 mmHg.  RHC in 11/24 showed elevated right-sided filling pressures with mild pulmonary hypertension and preserved PAPi. Torsemide was increased to 40 qam/20 qpm.   Follow up 11/24, mildly volume overloaded. Marcelline Deist started, referred to [facility] for percutaneous tricuspid repair consultation.  He presents to clinic today for evaluation of worsening LEE and SOB. Here w/ his wife. Reports that he stopped taking [name] shortly after starting it due to development of joint pain/ muscle aches. He also reports that while taking the [name], he was not taking torsemide. He did restart torsemide after stopping the [name].   He has b/l pitting edema up knees. He  denies resting dyspnea but symptomatic w/ exertion, NYHA Class II-early III. ReDs 43%. Wt is up 8 lb since Nov.  He has been monitoring fluid/sodium intake.   He missed his appt at [facility] due to the recent winter weather but is working to get rescheduled.     ECG: none ordered today.  Labs (10/24): LDL 93, K 4.1, creatinine 1.1 => 1.28, hgb 13.7, BNP 485 Labs (12/24): K 4.3, creatinine 1.72 Labs (1/25): K 4.1, creatinine 1.33  PMH: 1. HTN 2. Hyperlipidemia 3. Aortic stenosis: Severe. S/p TAVR in 9/23 with 29 mm Edwards Sapien 3 THV.  - Echo (10/23): EF 60-65%, normal RV, trivial MR, TAVR valve with no PVL and mean gradient 15 mmHg.  - Echo (9/24): EF 60-65%, mild LVH, D-shaped interventricular septum with severe RV enlargement, mild RV dysfunction, PASP 50 mmHg, TAVR valve with mean gradient 18 mmHg, mild MR, severe TR, dilated IVC.  - TEE (11/24): EF 55-60%, mild LVH, mildly D-shaped septum with severe RV enlargement but normal RV systolic function, severe biatrial enlargement, severe TR (suspect atrial functional TR), bioprosthetic aortic valve s/p TAVR with no evidence for thrombus/pannus, trivial perivalvular leakage, and mean gradient down to 13 mmHg.  4. Tricuspid regurgitation: Severe.  Noted first on 9/24 echo.  TEE (11/24): EF 55-60%, mild LVH, mildly D-shaped septum with severe RV enlargement but normal RV systolic function, severe biatrial enlargement, severe TR (suspect atrial functional TR), bioprosthetic aortic valve s/p TAVR with no evidence for thrombus/pannus, trivial perivalvular leakage, and mean gradient down to 13 mmHg.  - RHC (11/24): Mean  RA 10 with v-waves to 19; PA 42/14, mean 28; mean PCWP 10, CI 4.74, PVR 1.7 WU, PAPi 2.8.  5. LHC (8/23) with minimal CAD (pre-TAVR).  6. 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   Family History  Problem Relation Age of Onset   Colon cancer Mother     Alzheimer's disease Father    CVA Father    Diabetes Brother    ROS: All systems reviewed and negative except as per HPI.   Current Outpatient Medications  Medication Sig Dispense Refill   amoxicillin (AMOXIL) 500 MG tablet Take 4 tablets (2,000 mg total) by mouth as directed. 1 HOUR PRIOR TO DENTAL APPOINTMENTS 12 tablet 6   apixaban (ELIQUIS) 5 MG TABS tablet Take 1 tablet (5 mg total) by mouth 2 (two) times daily. 60 tablet 11   buprenorphine-naloxone (SUBOXONE) 8-2 mg SUBL SL tablet Place 2 tablets under the tongue daily.     clonazePAM (KLONOPIN) 0.5 MG tablet Take 0.5 mg by mouth 3 (three) times daily as needed for anxiety.     Collagen Hydrolysate POWD Take 3 applicators by mouth 2 (two) times daily.     dapagliflozin propanediol (FARXIGA) 10 MG TABS tablet Take 1 tablet (10 mg total) by mouth daily before breakfast. Please take this 30 tablet 11   Digestive Enzyme CAPS Take 1 capsule by mouth 2 (two) times daily.     metolazone (ZAROXOLYN) 2.5 MG tablet Take 1 tablet (2.5 mg total) by mouth daily as needed. 5 tablet 0   Multiple Vitamin (MULTIVITAMIN WITH MINERALS) TABS tablet Take 3 tablets by mouth daily. Total minerals     potassium chloride (MICRO-K) 10 MEQ CR capsule Take 20 mEq by mouth in the morning, at noon, and at bedtime.     Probiotic Product (PROBIOTIC PO) Take 1 capsule by mouth 2 (two) times daily. total flora     testosterone cypionate (DEPOTESTOSTERONE CYPIONATE) 200 MG/ML injection Inject 300 mg into the muscle every 28 (twenty-eight) days.     torsemide (DEMADEX) 20 MG tablet Take 3 tablets (60 mg total) by mouth daily. 90 tablet 3   No current facility-administered medications for this encounter.   Wt Readings from Last 3 Encounters:  08/14/23 94.6 kg (208 lb 8 oz)  07/04/23 92.6 kg (204 lb 3.2 oz)  05/04/23 89 kg (196 lb 3.2 oz)   BP 118/60   Pulse 79   Wt 94.6 kg (208 lb 8 oz)   SpO2 95%   BMI 26.77 kg/m    PHYSICAL EXAM: General:  looks much  younger than age. No respiratory difficulty HEENT: normal Neck: supple. Neck veins distended JVD 10 cm. Carotids 2+ bilat; no bruits. No lymphadenopathy or thyromegaly appreciated. Cor: PMI nondisplaced. Irregularly irregular rhythm and rate. 3/6 TR murmur  Lungs: decreased BS at the bases bilaterally  Abdomen: soft, nontender, mildly distended. No hepatosplenomegaly. No bruits or masses. Good bowel sounds. Extremities: no cyanosis, clubbing, rash, 2+ b/l LE pitting edema up to distal thighs, chronic venous stasis dermatitis  Neuro: alert & oriented x 3, cranial nerves grossly intact. moves all 4 extremities w/o difficulty. Affect pleasant.   Assessment/Plan:  1. 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  This is a new finding on the 9/24 echo compared to 10/23 echo.  Echo in 9/24 showed EF 60-65%, mild LVH, D-shaped interventricular septum with severe RV enlargement, mild  RV dysfunction, PASP 50 mmHg, TAVR valve with mean gradient 18 mmHg, mild MR, severe TR, dilated IVC. TEE in 11/24 showed EF 55-60%, mild LVH, mildly D-shaped septum with severe RV enlargement but normal RV systolic function, severe biatrial enlargement, severe TR (suspect atrial functional TR), bioprosthetic aortic valve s/p TAVR with no evidence for thrombus/pannus, trivial perivalvular leakage, and mean gradient down to 13 mmHg.   RHC in 11/24 showed signs of RV failure with elevated RA pressure though PAPi was preserved at 2.8. - Volume overloaded, NYHA Class II- early III. ReDs 43%, Wt up 8 lb since previous visit, in the setting of recent diuretic reduction (pt self discontinued Marcelline Deist due to SEs and was also not taking his Torsemide w/ the [name]). He has since restarted Torsemide at 40 mg daily  - Will increase Torsemide to 60 mg daily  - Add Metolazone 2.5 mg daily x 2 days to augment diuresis. Instructed to take an extra 40 mEq of KCl  -  Encouraged to use compression stockings to help w/ edema - Check BMP/BNP today  - may benefit from spiro addition next visit, pending SCr/K  - f/u in 1 wk to reassess volume status. May need trial of Furoscix if not improved    2. Permanent Atrial Fibrillation:  - rate controlled, HR 70s - on Eliquis 5 mg bid, reports compliance   2. Aortic Stenosis s/p TAVR:  stable on last TEE 11/24 mean gradient 13 mmHg   4. Hypertension  - controlled. Continue current regimen  - BMP today   5. Tricuspid regurgitation: This is severe on last echo in 9/24 with dilated RV and D-shaped interventricular septum.  TEE in 11/24 showed severe TR, possibly atrial functional TR with dilated right atrium. The RV on 11/24 TEE is severely dilated though overall systolic function appears normal.  - He has been referred to Dr. Art Buff at [facility] for evaluation for percutaneous tricuspid valve repair. Unfortunately he missed his recent appt due to winter weather. He is working to get rescheduled.    F/u in 1 wk. If still volume up next visit, consider Furoscix.    Robbie Lis PA-C  08/14/2023

## 2023-08-13 NOTE — Telephone Encounter (Signed)
 Patient called to report he missed appt with Dr Art Buff due to inclement weather last week, has not been able to reschedule.  Request an appt to discuss increased SOB with exertion (treadmill) and BLE. Reports symptoms have increased over the past 4 weeks.  Would also like to discuss the possibility of changing referral    Add on 2/25

## 2023-08-14 ENCOUNTER — Encounter (HOSPITAL_COMMUNITY): Payer: Self-pay

## 2023-08-14 ENCOUNTER — Ambulatory Visit (HOSPITAL_COMMUNITY)
Admission: RE | Admit: 2023-08-14 | Discharge: 2023-08-14 | Disposition: A | Discharge status: Home / Self Care | Payer: Medicare Other | Source: Ambulatory Visit | Attending: Cardiology | Admitting: Cardiology

## 2023-08-14 VITALS — BP 118/60 | HR 79 | Wt 208.5 lb

## 2023-08-14 DIAGNOSIS — Z7901 Long term (current) use of anticoagulants: Secondary | ICD-10-CM | POA: Insufficient documentation

## 2023-08-14 DIAGNOSIS — I110 Hypertensive heart disease with heart failure: Secondary | ICD-10-CM | POA: Diagnosis not present

## 2023-08-14 DIAGNOSIS — Z952 Presence of prosthetic heart valve: Secondary | ICD-10-CM | POA: Diagnosis not present

## 2023-08-14 DIAGNOSIS — I4821 Permanent atrial fibrillation: Secondary | ICD-10-CM | POA: Diagnosis not present

## 2023-08-14 DIAGNOSIS — I082 Rheumatic disorders of both aortic and tricuspid valves: Secondary | ICD-10-CM | POA: Diagnosis not present

## 2023-08-14 DIAGNOSIS — Z79899 Other long term (current) drug therapy: Secondary | ICD-10-CM | POA: Insufficient documentation

## 2023-08-14 DIAGNOSIS — I272 Pulmonary hypertension, unspecified: Secondary | ICD-10-CM | POA: Diagnosis not present

## 2023-08-14 DIAGNOSIS — Z91148 Patient's other noncompliance with medication regimen for other reason: Secondary | ICD-10-CM | POA: Diagnosis not present

## 2023-08-14 DIAGNOSIS — Z7984 Long term (current) use of oral hypoglycemic drugs: Secondary | ICD-10-CM | POA: Diagnosis not present

## 2023-08-14 DIAGNOSIS — I2510 Atherosclerotic heart disease of native coronary artery without angina pectoris: Secondary | ICD-10-CM | POA: Diagnosis not present

## 2023-08-14 DIAGNOSIS — I5042 Chronic combined systolic (congestive) and diastolic (congestive) heart failure: Secondary | ICD-10-CM | POA: Insufficient documentation

## 2023-08-14 LAB — BASIC METABOLIC PANEL
Anion gap: 5 (ref 5–15)
BUN: 24 mg/dL — ABNORMAL HIGH (ref 8–23)
CO2: 32 mmol/L (ref 22–32)
Calcium: 8.1 mg/dL — ABNORMAL LOW (ref 8.9–10.3)
Chloride: 98 mmol/L (ref 98–111)
Creatinine, Ser: 1.48 mg/dL — ABNORMAL HIGH (ref 0.61–1.24)
GFR, Estimated: 47 mL/min — ABNORMAL LOW (ref >60)
Glucose, Bld: 133 mg/dL — ABNORMAL HIGH (ref 70–99)
Potassium: 3.9 mmol/L (ref 3.5–5.1)
Sodium: 135 mmol/L (ref 135–145)

## 2023-08-14 LAB — BRAIN NATRIURETIC PEPTIDE: B Natriuretic Peptide: 508.9 pg/mL — ABNORMAL HIGH (ref 0.0–100.0)

## 2023-08-14 MED ORDER — METOLAZONE 2.5 MG PO TABS: 2.5000 mg | ORAL_TABLET | Freq: Every day | ORAL | 0 refills | Status: DC | PRN

## 2023-08-14 MED ORDER — TORSEMIDE 20 MG PO TABS: 60.0000 mg | ORAL_TABLET | Freq: Every day | ORAL | 3 refills | Status: DC

## 2023-08-14 NOTE — Progress Notes (Signed)
 ReDS Vest / Clip - 08/14/23 1000       ReDS Vest / Clip   Station Marker C    Ruler Value 26    ReDS Value Range High volume overload    ReDS Actual Value 43

## 2023-08-14 NOTE — Patient Instructions (Addendum)
 Medication Changes:  INCREASE Torsemide to 60mg  (3 tabs) daily  FOR TWO DAYS ONLY: Take Metolazone 2.5mg  (1 tab) TODAY and TOMORROW ONLY.  FOR TWO DAYS ONLY: Take an EXTRA (4 tabs) TODAY and TOMORROW.  Lab Work:  Labs done today, your results will be available in MyChart, we will contact you for abnormal readings.   Special Instructions // Education:  Do the following things EVERYDAY: Weigh yourself in the morning before breakfast. Write it down and keep it in a log. Take your medicines as prescribed Eat low salt foods--Limit salt (sodium) to 2000 mg per day.  Stay as active as you can everyday Limit all fluids for the day to less than 2 liters  PLEASE WEAR YOUR TED HOSE DAILY   Follow-Up in: Please follow up with the Advanced [facility] Failure Clinic in 1 week.  At the Advanced [facility] Failure Clinic, you and your health needs are our priority. We have a designated team specialized in the treatment of Heart Failure. This Care Team includes your primary Heart Failure Specialized Cardiologist (physician), Advanced Practice Providers (APPs- Physician Assistants and Nurse Practitioners), and Pharmacist who all work together to provide you with the care you need, when you need it.   You may see any of the following providers on your designated Care Team at your next follow up:  Dr. Arvilla Meres Dr. Marca Ancona Dr. Dorthula Nettles Dr. Theresia Bough Tonye Becket, NP Robbie Lis, [place] [facility] [place], [place] Brynda Peon, NP [place] Lee, NP Karle Plumber, PharmD   Please be sure to bring in all your medications bottles to every appointment.   Need to Contact us:  If you have any questions or concerns before your next appointment please send [place] a message through [place] or call our office at [phone].    TO LEAVE A MESSAGE FOR THE NURSE SELECT OPTION 2, PLEASE LEAVE A MESSAGE INCLUDING: YOUR NAME DATE OF BIRTH CALL BACK NUMBER REASON FOR CALL**this is  important as we prioritize the call backs  YOU WILL RECEIVE A CALL BACK THE SAME DAY AS LONG AS YOU CALL BEFORE 4:00 PM

## 2023-08-20 NOTE — Progress Notes (Signed)
 Advanced Heart Failure Clinic Note     PCP: Danella Penton, MD Cardiology: [facility] Cardiology: Dr. Shirlee Latch  CC: Follow Up for LEE 2/2 acute on chronic RV failure   83 y.o. with history of permanent atrial fibrillation, aortic stenosis s/p TAVR, and CHF. In 9/23, patient had TAVR for severe AS with 29 mm Edwards Sapien 3 THV.  Prior to TAVR, he had LHC in 8/23 showing minimal CAD.  Initial post-TAVR echo in 10/23 showed EF 60-65%, normal RV, trivial MR, TAVR valve with no PVL and mean gradient 15 mmHg.  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 Of note, patient was not taking Eliquis at at time of 9/24 echo.  He had been concerned about bruising.  He started taking apixaban in 10/24.   TEE was done in 11/24, showing EF 55-60%, mild LVH, mildly D-shaped septum with severe RV enlargement but normal RV systolic function, severe biatrial enlargement, severe TR (suspect atrial functional TR), bioprosthetic aortic valve s/p TAVR with no evidence for thrombus/pannus, trivial perivalvular leakage, and mean gradient down to 13 mmHg.  RHC in 11/24 showed elevated right-sided filling pressures with mild pulmonary hypertension and preserved PAPi. Torsemide was increased to 40 qam/20 qpm.   Follow up 11/24, mildly volume overloaded. Marcelline Deist started, referred to [facility] for percutaneous tricuspid repair consultation.  He was seen in clinic last week for worsening LEE and SOB. At visit, he reported that he had self discontinued [name] given development of joint pain/ muscle aches. He also reported that while taking the [name], he was not taking torsemide. He did restart torsemide after stopping the [name] but was still volume up when evaluated in  clinic and his ReDs was also elevated at 43% and wt was up 8 lb from previous baseline.  He was instructed to increase Torsemide to 60 mg daily and add Metolazone 2.5 mg daily x 2 days to augment diuresis.  He presents back today for f/u.   He missed his appt at [facility] due to the recent winter weather but is working to get rescheduled.     ECG: ***  Labs (10/24): LDL 93, K 4.1, creatinine 1.1 => 1.28, hgb 13.7, BNP 485 Labs (12/24): K 4.3, creatinine 1.72 Labs (1/25): K 4.1, creatinine 1.33 Labs (2/25): K 3.9, creatinine 1.5   PMH: 1. HTN 2. Hyperlipidemia 3. Aortic stenosis: Severe. S/p TAVR in 9/23 with 29 mm Edwards Sapien 3 THV.  - Echo (10/23): EF 60-65%, normal RV, trivial MR, TAVR valve with no PVL and mean gradient 15 mmHg.  - Echo (9/24): EF 60-65%, mild LVH, D-shaped interventricular septum with severe RV enlargement, mild RV dysfunction, PASP 50 mmHg, TAVR valve with mean gradient 18 mmHg, mild MR, severe TR, dilated IVC.  - TEE (11/24): EF 55-60%, mild LVH, mildly D-shaped septum with severe RV enlargement but normal RV systolic function, severe biatrial enlargement, severe TR (suspect atrial functional TR), bioprosthetic aortic valve s/p TAVR with no evidence for thrombus/pannus, trivial perivalvular leakage, and mean gradient down to 13 mmHg.  4. Tricuspid regurgitation: Severe.  Noted first on 9/24 echo.  TEE (11/24): EF 55-60%, mild LVH, mildly D-shaped septum with severe RV enlargement but normal RV systolic function, severe biatrial enlargement, severe TR (suspect atrial functional TR),  bioprosthetic aortic valve s/p TAVR with no evidence for thrombus/pannus, trivial perivalvular leakage, and mean gradient down to 13 mmHg.  - RHC (11/24): Mean RA 10 with v-waves to 19; PA 42/14, mean 28; mean PCWP 10, CI 4.74, PVR 1.7 WU, PAPi 2.8.  5. LHC (8/23) with minimal CAD (pre-TAVR).  6. 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   Family History  Problem Relation Age of Onset   Colon cancer Mother    Alzheimer's disease Father    CVA Father    Diabetes Brother    ROS: All systems reviewed and negative except as per HPI.   Current Outpatient Medications  Medication Sig Dispense Refill   amoxicillin (AMOXIL) 500 MG tablet Take 4 tablets (2,000 mg total) by mouth as directed. 1 HOUR PRIOR TO DENTAL APPOINTMENTS 12 tablet 6   apixaban (ELIQUIS) 5 MG TABS tablet Take 1 tablet (5 mg total) by mouth 2 (two) times daily. 60 tablet 11   buprenorphine-naloxone (SUBOXONE) 8-2 mg SUBL SL tablet Place 2 tablets under the tongue daily.     clonazePAM (KLONOPIN) 0.5 MG tablet Take 0.5 mg by mouth 3 (three) times daily as needed for anxiety.     Collagen Hydrolysate POWD Take 3 applicators by mouth 2 (two) times daily.     dapagliflozin propanediol (FARXIGA) 10 MG TABS tablet Take 1 tablet (10 mg total) by mouth daily before breakfast. Please take this 30 tablet 11   Digestive Enzyme CAPS Take 1 capsule by mouth 2 (two) times daily.     metolazone (ZAROXOLYN) 2.5 MG tablet Take 1 tablet (2.5 mg total) by mouth daily as needed. 5 tablet 0   Multiple Vitamin (MULTIVITAMIN WITH MINERALS) TABS tablet Take 3 tablets by mouth daily. Total minerals     potassium chloride (MICRO-K) 10 MEQ CR capsule Take 20 mEq by mouth in the morning, at noon, and at bedtime.     Probiotic Product (PROBIOTIC PO) Take 1 capsule by mouth 2 (two) times daily. total flora     testosterone cypionate (DEPOTESTOSTERONE CYPIONATE) 200 MG/ML injection Inject 300 mg into the muscle every 28 (twenty-eight) days.     torsemide (DEMADEX) 20 MG tablet Take 3 tablets (60 mg total) by mouth daily. 90 tablet 3   No current facility-administered medications for this visit.   Wt Readings from Last 3 Encounters:  08/14/23 94.6 kg (208 lb 8 oz)  07/04/23 92.6 kg (204 lb 3.2 oz)  05/04/23 89 kg (196 lb 3.2 oz)   There were no vitals taken for  this visit.   PHYSICAL EXAM: General:  Well appearing. No respiratory difficulty HEENT: normal Neck: supple. no JVD. Carotids 2+ bilat; no bruits. No lymphadenopathy or thyromegaly appreciated. Cor: PMI nondisplaced. Regular rate & rhythm. No rubs, gallops or murmurs. Lungs: clear Abdomen: soft, nontender, nondistended. No hepatosplenomegaly. No bruits or masses. Good bowel sounds. Extremities: no cyanosis, clubbing, rash, edema Neuro: alert & oriented x 3, cranial nerves grossly intact. moves all 4 extremities w/o difficulty. Affect pleasant.   Assessment/Plan:  1. 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  This is a new finding on the 9/24 echo compared to 10/23 echo.  Echo in 9/24 showed EF 60-65%, mild LVH, D-shaped interventricular septum with severe RV enlargement, mild RV dysfunction, PASP 50 mmHg, TAVR valve with mean gradient 18 mmHg, mild MR, severe TR, dilated IVC. TEE  in 11/24 showed EF 55-60%, mild LVH, mildly D-shaped septum with severe RV enlargement but normal RV systolic function, severe biatrial enlargement, severe TR (suspect atrial functional TR), bioprosthetic aortic valve s/p TAVR with no evidence for thrombus/pannus, trivial perivalvular leakage, and mean gradient down to 13 mmHg.   RHC in 11/24 showed signs of RV failure with elevated RA pressure though PAPi was preserved at 2.8. - Volume overloaded, NYHA Class II- early III. ReDs 43%, Wt up 8 lb since previous visit, in the setting of recent diuretic reduction (pt self discontinued Marcelline Deist due to SEs and was also not taking his Torsemide w/ the [name]). He has since restarted Torsemide at 40 mg daily  - Will increase Torsemide to 60 mg daily  - Add Metolazone 2.5 mg daily x 2 days to augment diuresis. Instructed to take an extra 40 mEq of KCl  - Encouraged to use compression stockings to help w/ edema - Check BMP/BNP today  - may benefit  from spiro addition next visit, pending SCr/K  - f/u in 1 wk to reassess volume status. May need trial of Furoscix if not improved    2. Permanent Atrial Fibrillation:  - rate controlled, HR 70s - on Eliquis 5 mg bid, reports compliance   2. Aortic Stenosis s/p TAVR:  stable on last TEE 11/24 mean gradient 13 mmHg   4. Hypertension  - controlled. Continue current regimen  - BMP today   5. Tricuspid regurgitation: This is severe on last echo in 9/24 with dilated RV and D-shaped interventricular septum.  TEE in 11/24 showed severe TR, possibly atrial functional TR with dilated right atrium. The RV on 11/24 TEE is severely dilated though overall systolic function appears normal.  - He has been referred to Dr. Art Buff at [facility] for evaluation for percutaneous tricuspid valve repair. Unfortunately he missed his recent appt due to winter weather. He is working to get rescheduled.    F/u in 1 wk. If still volume up next visit, consider Furoscix.    Robbie Lis PA-C  08/20/2023

## 2023-08-21 ENCOUNTER — Encounter (HOSPITAL_COMMUNITY): Payer: Self-pay | Admitting: Cardiology

## 2023-08-21 ENCOUNTER — Ambulatory Visit (HOSPITAL_COMMUNITY)
Admission: RE | Admit: 2023-08-21 | Discharge: 2023-08-21 | Disposition: A | Discharge status: Home / Self Care | Payer: Medicare Other | Source: Ambulatory Visit | Attending: Cardiology | Admitting: Cardiology

## 2023-08-21 ENCOUNTER — Encounter (HOSPITAL_COMMUNITY): Payer: Self-pay

## 2023-08-21 ENCOUNTER — Telehealth (HOSPITAL_COMMUNITY): Payer: Self-pay | Admitting: Cardiology

## 2023-08-21 VITALS — BP 128/68 | HR 77 | Ht 74.0 in | Wt 191.6 lb

## 2023-08-21 DIAGNOSIS — Z952 Presence of prosthetic heart valve: Secondary | ICD-10-CM | POA: Diagnosis not present

## 2023-08-21 DIAGNOSIS — Z7901 Long term (current) use of anticoagulants: Secondary | ICD-10-CM | POA: Insufficient documentation

## 2023-08-21 DIAGNOSIS — I110 Hypertensive heart disease with heart failure: Secondary | ICD-10-CM | POA: Diagnosis present

## 2023-08-21 DIAGNOSIS — I082 Rheumatic disorders of both aortic and tricuspid valves: Secondary | ICD-10-CM | POA: Insufficient documentation

## 2023-08-21 DIAGNOSIS — I4821 Permanent atrial fibrillation: Secondary | ICD-10-CM | POA: Diagnosis not present

## 2023-08-21 DIAGNOSIS — I5032 Chronic diastolic (congestive) heart failure: Secondary | ICD-10-CM | POA: Insufficient documentation

## 2023-08-21 DIAGNOSIS — Z7984 Long term (current) use of oral hypoglycemic drugs: Secondary | ICD-10-CM | POA: Diagnosis not present

## 2023-08-21 DIAGNOSIS — Z79899 Other long term (current) drug therapy: Secondary | ICD-10-CM | POA: Diagnosis not present

## 2023-08-21 LAB — BASIC METABOLIC PANEL
Anion gap: 6 (ref 5–15)
BUN: 29 mg/dL — ABNORMAL HIGH (ref 8–23)
CO2: 37 mmol/L — ABNORMAL HIGH (ref 22–32)
Calcium: 8.8 mg/dL — ABNORMAL LOW (ref 8.9–10.3)
Chloride: 98 mmol/L (ref 98–111)
Creatinine, Ser: 1.39 mg/dL — ABNORMAL HIGH (ref 0.61–1.24)
GFR, Estimated: 51 mL/min — ABNORMAL LOW (ref >60)
Glucose, Bld: 124 mg/dL — ABNORMAL HIGH (ref 70–99)
Potassium: 4.1 mmol/L (ref 3.5–5.1)
Sodium: 141 mmol/L (ref 135–145)

## 2023-08-21 LAB — BRAIN NATRIURETIC PEPTIDE: B Natriuretic Peptide: 564.9 pg/mL — ABNORMAL HIGH (ref 0.0–100.0)

## 2023-08-21 MED ORDER — SPIRONOLACTONE 25 MG PO TABS: 12.5000 mg | ORAL_TABLET | Freq: Every day | ORAL | 3 refills | Status: DC

## 2023-08-21 NOTE — Telephone Encounter (Signed)
 Patient called.  Patient aware.

## 2023-08-21 NOTE — Patient Instructions (Signed)
 Medication Changes:  NO CHANGES  Lab Work:  Labs done today, your results will be available in MyChart, we will contact you for abnormal readings.    Follow-Up in: 4 WEEKS AS SCHEDULED  At the Advanced [facility] Clinic, you and your health needs are our priority. We have a designated team specialized in the treatment of Heart Failure. This Care Team includes your primary Heart Failure Specialized Cardiologist (physician), Advanced Practice Providers (APPs- Physician Assistants and Nurse Practitioners), and Pharmacist who all work together to provide you with the care you need, when you need it.   You may see any of the following providers on your designated Care Team at your next follow up:  Dr. Arvilla Meres Dr. Marca Ancona Dr. Dorthula Nettles Dr. Theresia Bough Tonye Becket, NP Robbie Lis, [place] [facility] [place], [place] Brynda Peon, NP [place] Lee, NP Karle Plumber, PharmD   Please be sure to bring in all your medications bottles to every appointment.   Need to Contact us:  If you have any questions or concerns before your next appointment please send [place] a message through [place] or call our office at [phone].    TO LEAVE A MESSAGE FOR THE NURSE SELECT OPTION 2, PLEASE LEAVE A MESSAGE INCLUDING: YOUR NAME DATE OF BIRTH CALL BACK NUMBER REASON FOR CALL**this is important as we prioritize the call backs  YOU WILL RECEIVE A CALL BACK THE SAME DAY AS LONG AS YOU CALL BEFORE 4:00 PM

## 2023-08-21 NOTE — Telephone Encounter (Signed)
-----   Message from [place] sent at 08/21/2023 11:32 AM EST ----- SCr and K stable. Start Spiro 12.5 mg daily (this will help w/ HF and fluid), continue Torsemide 60 mg daily.  Needs repeat BMP and BNP in 1 wk. Keep f/u w/ Dr. Shirlee Latch later this month    Take Metolazone 2.5 mg PRN for > 5 lb wt gain in 1 wk (take extra 40 mEq of KCl w/ Metolazone).

## 2023-08-31 ENCOUNTER — Ambulatory Visit (HOSPITAL_COMMUNITY)
Admission: RE | Admit: 2023-08-31 | Discharge: 2023-08-31 | Disposition: A | Discharge status: Home / Self Care | Source: Ambulatory Visit | Attending: Cardiology | Admitting: Cardiology

## 2023-08-31 DIAGNOSIS — I5032 Chronic diastolic (congestive) heart failure: Secondary | ICD-10-CM

## 2023-08-31 LAB — BASIC METABOLIC PANEL
Anion gap: 6 (ref 5–15)
BUN: 31 mg/dL — ABNORMAL HIGH (ref 8–23)
CO2: 33 mmol/L — ABNORMAL HIGH (ref 22–32)
Calcium: 8.6 mg/dL — ABNORMAL LOW (ref 8.9–10.3)
Chloride: 100 mmol/L (ref 98–111)
Creatinine, Ser: 1.27 mg/dL — ABNORMAL HIGH (ref 0.61–1.24)
GFR, Estimated: 57 mL/min — ABNORMAL LOW (ref >60)
Glucose, Bld: 146 mg/dL — ABNORMAL HIGH (ref 70–99)
Potassium: 4.2 mmol/L (ref 3.5–5.1)
Sodium: 139 mmol/L (ref 135–145)

## 2023-08-31 LAB — BRAIN NATRIURETIC PEPTIDE: B Natriuretic Peptide: 510.9 pg/mL — ABNORMAL HIGH (ref 0.0–100.0)

## 2023-09-10 ENCOUNTER — Telehealth (HOSPITAL_COMMUNITY): Payer: Self-pay | Admitting: Cardiology

## 2023-09-10 IMAGING — CR DG CHEST 2V
2 series · 2 of 2 positions shown · non-contrast
Comparison: 12/29/2006

CLINICAL DATA: 80-year-old male with dyspnea on exertion

EXAM:
CHEST - 2 VIEW

[w chest pa]
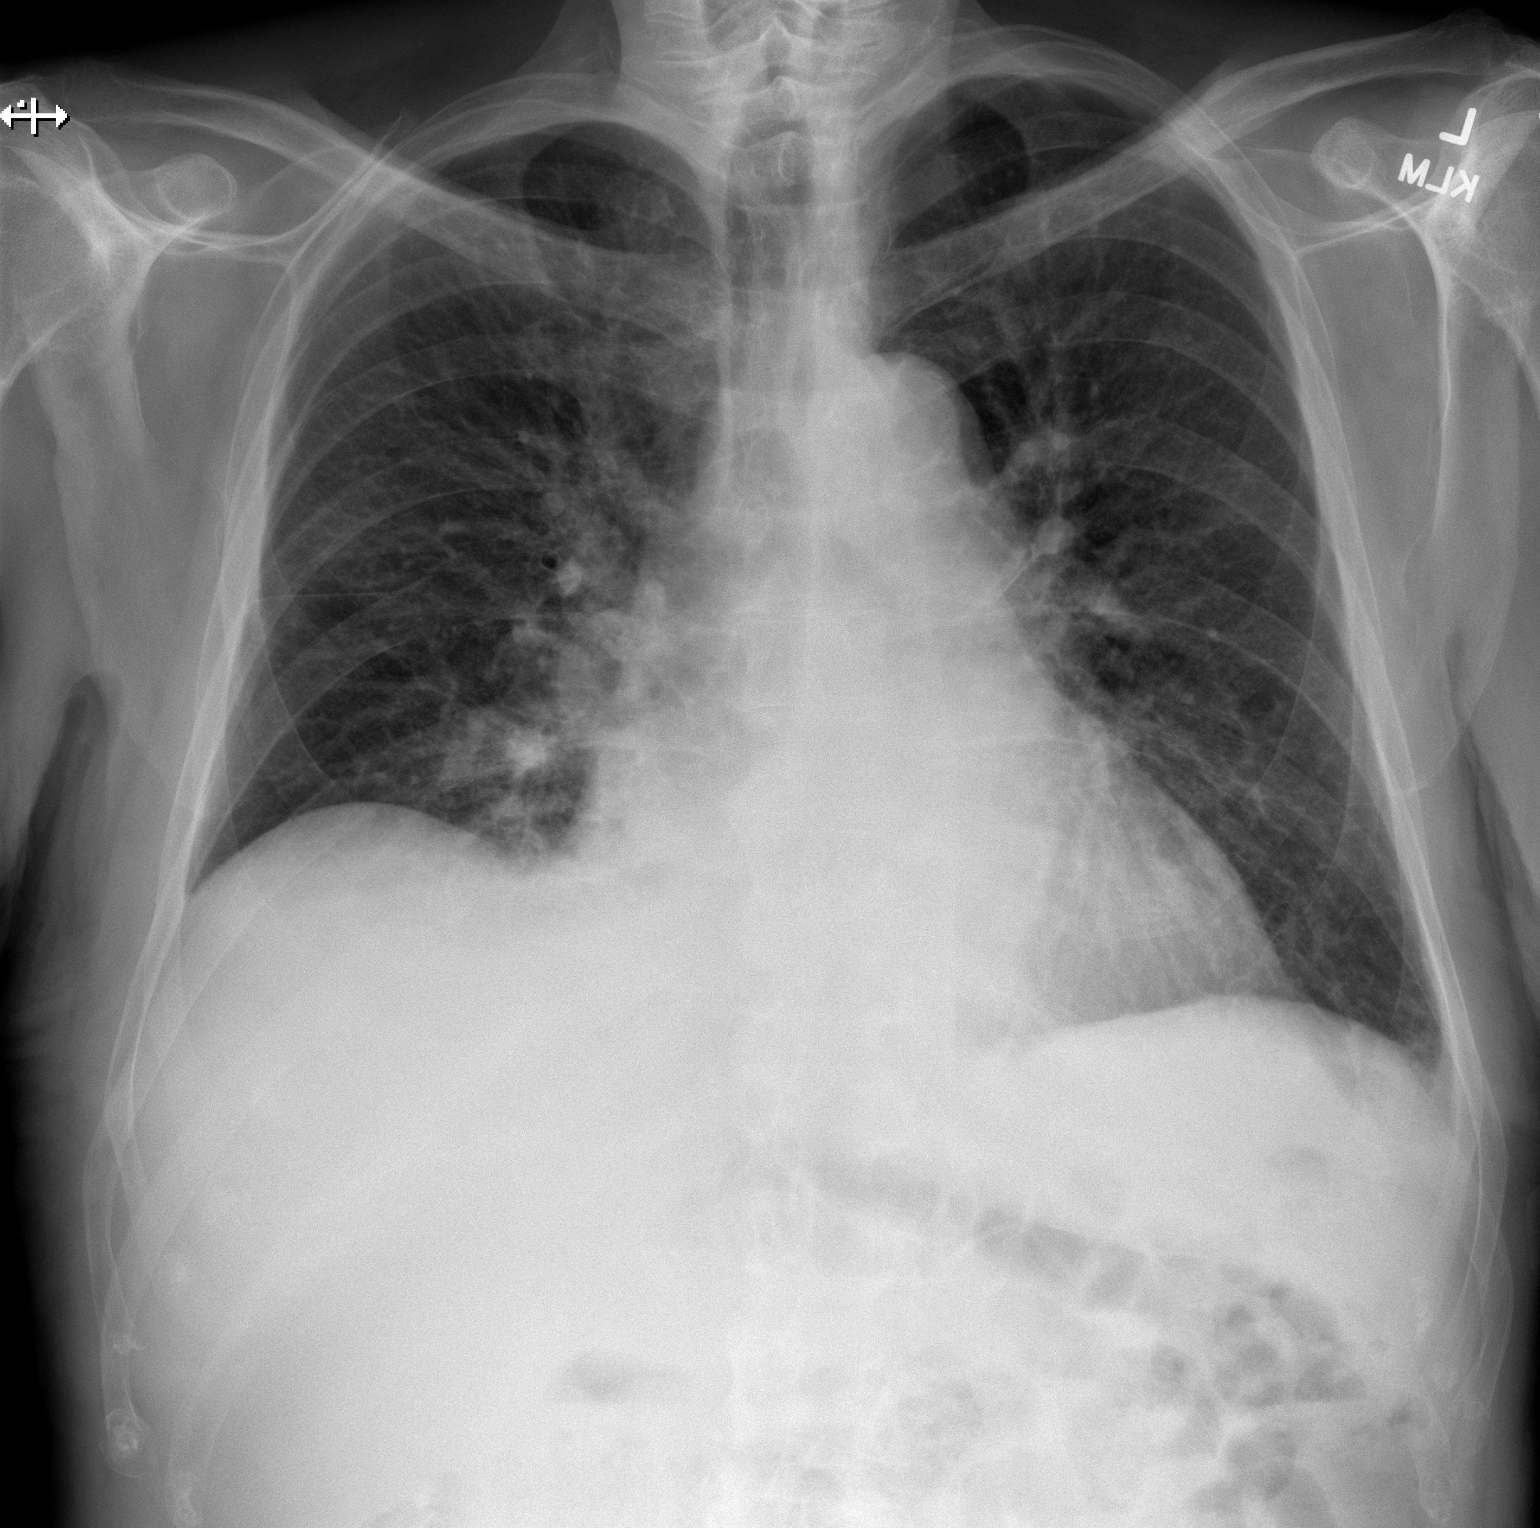

[w chest lat]
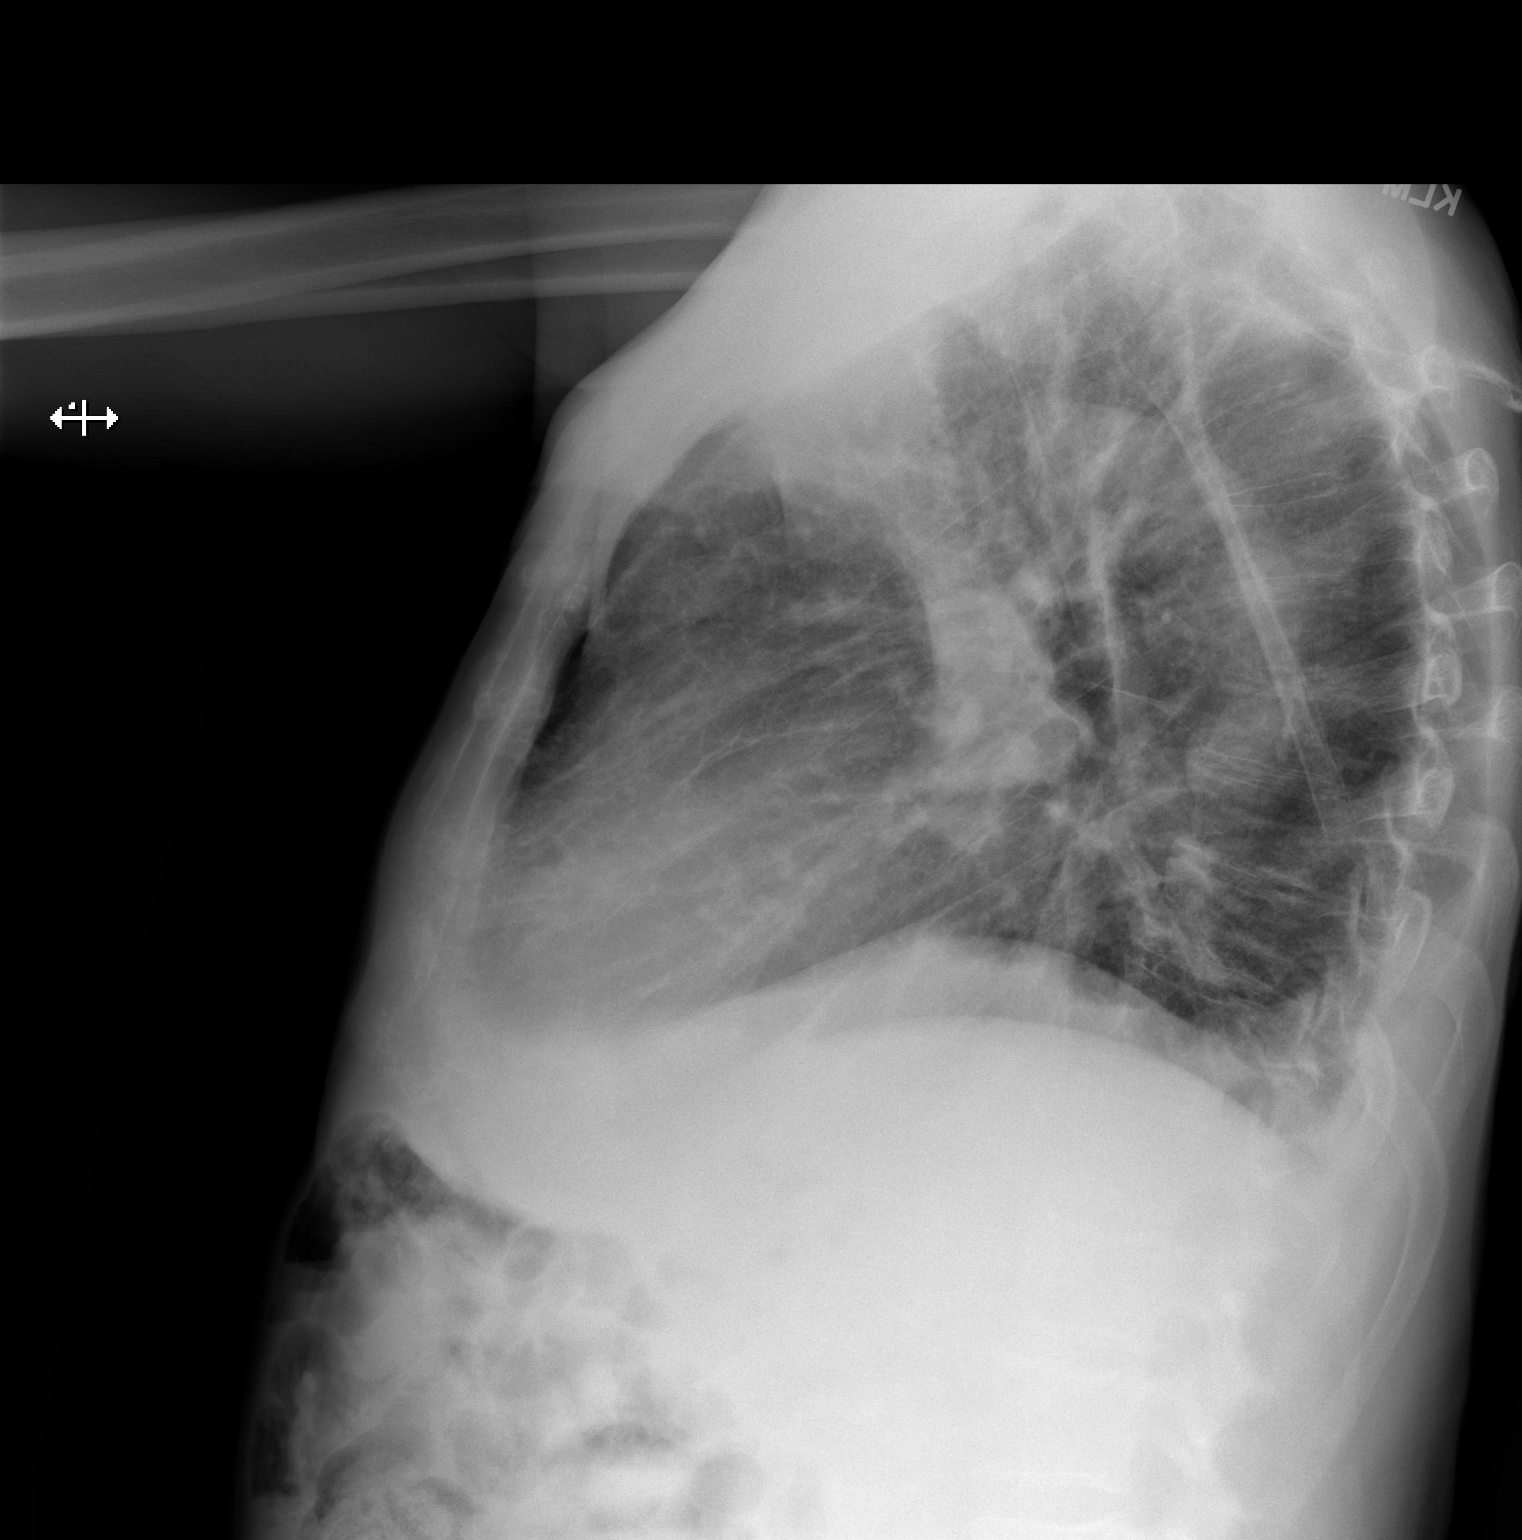

[2 of 2 positions shown; findings below may reference images not displayed]

FINDINGS: Cardiomediastinal silhouette appears enlarged compared to the prior.
Obscuration of the right heart border at the cardiophrenic angle.

Meniscus at the posterior on the lateral view with blunting of the
costophrenic sulcus.

No interlobular septal thickening.  No pneumothorax.

Degenerative changes of the spine.  No displaced fracture.
IMPRESSION: Opacity at the right lung base obscuring the right heart border,
potentially pneumonia or volume loss of the middle lobe. Small
pleural effusion present on the lateral view. Either a follow-up
chest x-ray at a short 2-3 week interval or a chest CT may be
considered.

## 2023-09-11 ENCOUNTER — Ambulatory Visit (HOSPITAL_COMMUNITY)
Admission: RE | Admit: 2023-09-11 | Discharge: 2023-09-11 | Disposition: A | Discharge status: Home / Self Care | Payer: Medicare Other | Source: Ambulatory Visit | Attending: Cardiology | Admitting: Cardiology

## 2023-09-11 ENCOUNTER — Encounter (HOSPITAL_COMMUNITY): Payer: Self-pay | Admitting: Cardiology

## 2023-09-11 VITALS — BP 108/60 | HR 72 | Wt 195.8 lb

## 2023-09-11 DIAGNOSIS — Z952 Presence of prosthetic heart valve: Secondary | ICD-10-CM | POA: Insufficient documentation

## 2023-09-11 DIAGNOSIS — I2510 Atherosclerotic heart disease of native coronary artery without angina pectoris: Secondary | ICD-10-CM | POA: Diagnosis not present

## 2023-09-11 DIAGNOSIS — Z7901 Long term (current) use of anticoagulants: Secondary | ICD-10-CM | POA: Diagnosis not present

## 2023-09-11 DIAGNOSIS — I4821 Permanent atrial fibrillation: Secondary | ICD-10-CM | POA: Insufficient documentation

## 2023-09-11 DIAGNOSIS — I5042 Chronic combined systolic (congestive) and diastolic (congestive) heart failure: Secondary | ICD-10-CM

## 2023-09-11 DIAGNOSIS — I444 Left anterior fascicular block: Secondary | ICD-10-CM | POA: Diagnosis not present

## 2023-09-11 DIAGNOSIS — I082 Rheumatic disorders of both aortic and tricuspid valves: Secondary | ICD-10-CM | POA: Insufficient documentation

## 2023-09-11 DIAGNOSIS — Z79899 Other long term (current) drug therapy: Secondary | ICD-10-CM | POA: Diagnosis not present

## 2023-09-11 DIAGNOSIS — I5032 Chronic diastolic (congestive) heart failure: Secondary | ICD-10-CM | POA: Insufficient documentation

## 2023-09-11 DIAGNOSIS — I110 Hypertensive heart disease with heart failure: Secondary | ICD-10-CM | POA: Diagnosis present

## 2023-09-11 MED ORDER — TORSEMIDE 20 MG PO TABS: 60.0000 mg | ORAL_TABLET | Freq: Every day | ORAL | 3 refills | Status: DC

## 2023-09-11 MED ORDER — METOLAZONE 2.5 MG PO TABS: 2.5000 mg | ORAL_TABLET | ORAL | 3 refills | Status: DC

## 2023-09-11 MED ORDER — SPIRONOLACTONE 25 MG PO TABS: 12.5000 mg | ORAL_TABLET | Freq: Every day | ORAL | Status: DC

## 2023-09-11 NOTE — Patient Instructions (Addendum)
 INCREASE Torsemide to 60 mg daily.  CHANGE Metolazone to weekly every Wednesday with morning dose of Torsemide and an extra 20 mEq of potassium.  Blood work in a week.  Dr. Shirlee Latch has ordered a CT coronary calcium score.   Test locations:  [facility]  [facility]  Imaging at [facility]  This is $99 out of pocket.  Coronary CalciumScan A coronary calcium scan is an imaging test used to look for deposits of calcium and other fatty materials (plaques) in the inner lining of the blood vessels of the heart (coronary arteries). These deposits of calcium and plaques can partly clog and narrow the coronary arteries without producing any symptoms or warning signs. This puts a person at risk for a heart attack. This test can detect these deposits before symptoms develop. Tell a health care provider about: Any allergies you have. All medicines you are taking, including vitamins, herbs, eye drops, creams, and over-the-counter medicines. Any problems you or family members have had with anesthetic medicines. Any blood disorders you have. Any surgeries you have had. Any medical conditions you have. Whether you are pregnant or may be pregnant. What are the risks? Generally, this is a safe procedure. However, problems may occur, including: Harm to a pregnant woman and her unborn baby. This test involves the use of radiation. Radiation exposure can be dangerous to a pregnant woman and her unborn baby. If you are pregnant, you generally should not have this procedure done. Slight increase in the risk of cancer. This is because of the radiation involved in the test. What happens before the procedure? No preparation is needed for this procedure. What happens during the procedure? You will undress and remove any jewelry around your neck or chest. You will put on a hospital gown. Sticky electrodes will be placed on your chest. The  electrodes will be connected to an electrocardiogram (ECG) machine to record a tracing of the electrical activity of your heart. A CT scanner will take pictures of your heart. During this time, you will be asked to lie still and hold your breath for 2-3 seconds while a picture of your heart is being taken. The procedure may vary among health care providers and hospitals. What happens after the procedure? You can get dressed. You can return to your normal activities. It is up to you to get the results of your test. Ask your health care provider, or the department that is doing the test, when your results will be ready. Summary A coronary calcium scan is an imaging test used to look for deposits of calcium and other fatty materials (plaques) in the inner lining of the blood vessels of the heart (coronary arteries). Generally, this is a safe procedure. Tell your health care provider if you are pregnant or may be pregnant. No preparation is needed for this procedure. A CT scanner will take pictures of your heart. You can return to your normal activities after the scan is done. This information is not intended to replace advice given to you by your health care provider. Make sure you discuss any questions you have with your health care provider. Document Released: 12/02/2007 Document Revised: 04/24/2016 Document Reviewed: 04/24/2016 Elsevier Interactive Patient Education  2017 [employer].    Your physician recommends that you schedule a follow-up appointment in: as scheduled.  If you have any questions or concerns before your next appointment please send [place] a message through [place] or call our office  at [phone].    TO LEAVE A MESSAGE FOR THE NURSE SELECT OPTION 2, PLEASE LEAVE A MESSAGE INCLUDING: YOUR NAME DATE OF BIRTH CALL BACK NUMBER REASON FOR CALL**this is important as we prioritize the call backs  YOU WILL RECEIVE A CALL BACK THE SAME DAY AS LONG AS YOU CALL BEFORE 4:00  PM  At the [facility], you and your health needs are our priority. As part of our continuing mission to provide you with exceptional heart care, we have created designated Provider Care Teams. These Care Teams include your primary Cardiologist (physician) and Advanced Practice Providers (APPs- Physician Assistants and Nurse Practitioners) who all work together to provide you with the care you need, when you need it.   You may see any of the following providers on your designated Care Team at your next follow up: Dr Arvilla Meres Dr Marca Ancona Dr. Dorthula Nettles Dr. Clearnce Hasten Amy Filbert Schilder, NP Robbie Lis, [place] [facility] Reasnor, [place] Brynda Peon, NP [place] Lee, NP Clarisa Kindred, NP Karle Plumber, PharmD Enos Fling, PharmD   Please be sure to bring in all your medications bottles to every appointment.    Thank you for choosing [facility]

## 2023-09-12 ENCOUNTER — Other Ambulatory Visit (HOSPITAL_COMMUNITY): Payer: Self-pay | Admitting: Family Medicine

## 2023-09-12 NOTE — Progress Notes (Signed)
 [facility] Note     PCP: Danella Penton, MD HF Cardiology: Dr. Shirlee Latch  CC: CHF  83 y.o. with history of permanent atrial fibrillation, aortic stenosis s/p TAVR, and CHF. In 9/23, patient had TAVR for severe AS with 29 mm Edwards Sapien 3 THV.  Prior to TAVR, he had LHC in 8/23 showing minimal CAD.  Initial post-TAVR echo in 10/23 showed EF 60-65%, normal RV, trivial MR, TAVR valve with no PVL and mean gradient 15 mmHg.  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 Of note, patient was not taking Eliquis at at time of 9/24 echo.  He had been concerned about bruising.  He started taking apixaban in 10/24.   TEE was done in 11/24, showing EF 55-60%, mild LVH, mildly D-shaped septum with severe RV enlargement but normal RV systolic function, severe biatrial enlargement, severe TR (suspect atrial functional TR), bioprosthetic aortic valve s/p TAVR with no evidence for thrombus/pannus, trivial perivalvular leakage, and mean gradient down to 13 mmHg.  RHC in 11/24 showed elevated right-sided filling pressures with mild pulmonary hypertension and preserved PAPi. Torsemide was increased to 40 qam/20 qpm.   Patient self-discontinued Marcelline Deist given development of joint pain/ muscle aches.   Patient returns for followup of CHF.  He never started the spironolactone prescribed at last appointment.  He is taking torsemide 40 mg daily.  Weight is up 4 lbs.  He can walk about 3/4 of a mile on his treadmill then gets short of breath and stops, this is worse than in the past.  He finds that he tires more easily. No orthopnea/PND.  No chest pain.  He has significant peripheral edema.  He missed his initial appointment at [facility] in [place] to discuss  percutaneous tricuspid repair due to snow.   ECG: Atrial fibrillation at 75, LAFB.   Labs (10/24): LDL 93, K 4.1, creatinine 1.1 => 1.28, hgb 13.7, BNP 485 Labs (12/24): K 4.3, creatinine 1.72 Labs (1/25): K 4.1, creatinine 1.33 Labs (2/25): K 3.9, creatinine 1.5  Labs (3/25): K 4.2, creatinine 1.27, BNP 511  PMH: 1. HTN 2. Hyperlipidemia 3. Aortic stenosis: Severe. S/p TAVR in 9/23 with 29 mm Edwards Sapien 3 THV.  - Echo (10/23): EF 60-65%, normal RV, trivial MR, TAVR valve with no PVL and mean gradient 15 mmHg.  - Echo (9/24): EF 60-65%, mild LVH, D-shaped interventricular septum with severe RV enlargement, mild RV dysfunction, PASP 50 mmHg, TAVR valve with mean gradient 18 mmHg, mild MR, severe TR, dilated IVC.  - TEE (11/24): EF 55-60%, mild LVH, mildly D-shaped septum with severe RV enlargement but normal RV systolic function, severe biatrial enlargement, severe TR (suspect atrial functional TR), bioprosthetic aortic valve s/p TAVR with no evidence for thrombus/pannus, trivial perivalvular leakage, and mean gradient down to 13 mmHg.  4. Tricuspid regurgitation: Severe.  Noted first on 9/24 echo.  TEE (11/24): EF 55-60%, mild LVH, mildly D-shaped septum with severe RV enlargement but normal RV systolic function, severe biatrial enlargement, severe TR (suspect atrial functional TR), bioprosthetic aortic valve s/p TAVR with no evidence for thrombus/pannus, trivial perivalvular leakage, and mean gradient down to 13 mmHg.  - RHC (11/24): Mean RA 10 with v-waves to 19; PA 42/14, mean 28; mean PCWP 10,  CI 4.74, PVR 1.7 WU, PAPi 2.8.  5. LHC (8/23) with minimal CAD (pre-TAVR).  6. 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   Family History  Problem Relation Age of Onset   Colon cancer Mother    Alzheimer's disease Father    CVA Father    Diabetes Brother    ROS: All systems reviewed and negative except as per HPI.    Current Outpatient Medications  Medication Sig Dispense Refill   amoxicillin (AMOXIL) 500 MG tablet Take 4 tablets (2,000 mg total) by mouth as directed. 1 HOUR PRIOR TO DENTAL APPOINTMENTS 12 tablet 6   apixaban (ELIQUIS) 5 MG TABS tablet Take 1 tablet (5 mg total) by mouth 2 (two) times daily. 60 tablet 11   buprenorphine-naloxone (SUBOXONE) 8-2 mg SUBL SL tablet Place 2 tablets under the tongue daily.     clonazePAM (KLONOPIN) 0.5 MG tablet Take 0.5 mg by mouth 3 (three) times daily as needed for anxiety.     Collagen Hydrolysate POWD Take 3 applicators by mouth 2 (two) times daily.     Digestive Enzyme CAPS Take 1 capsule by mouth 2 (two) times daily.     Multiple Vitamin (MULTIVITAMIN WITH MINERALS) TABS tablet Take 3 tablets by mouth daily. Total minerals     potassium citrate (UROCIT-K) 10 MEQ (1080 MG) SR tablet Take 20 mEq by mouth 3 (three) times daily with meals.     Probiotic Product (PROBIOTIC PO) Take 1 capsule by mouth 2 (two) times daily. total flora     testosterone cypionate (DEPOTESTOSTERONE CYPIONATE) 200 MG/ML injection Inject 300 mg into the muscle every 28 (twenty-eight) days.     metolazone (ZAROXOLYN) 2.5 MG tablet Take 1 tablet (2.5 mg total) by mouth once a week. Every Wednesday with an extra 20 mEq of Potassium 15 tablet 3   spironolactone (ALDACTONE) 25 MG tablet Take 0.5 tablets (12.5 mg total) by mouth daily.     torsemide (DEMADEX) 20 MG tablet Take 3 tablets (60 mg total) by mouth daily. 200 tablet 3   No current facility-administered medications for this encounter.   Wt Readings from Last 3 Encounters:  09/11/23 88.8 kg (195 lb 12.8 oz)  08/21/23 86.9 kg (191 lb 9.6 oz)  08/14/23 94.6 kg (208 lb 8 oz)   BP 108/60   Pulse 72   Wt 88.8 kg (195 lb 12.8 oz)   SpO2 99%   BMI 25.14 kg/m    PHYSICAL EXAM: General: NAD Neck: JVP 10-12 cm, no thyromegaly or thyroid nodule.  Lungs: Clear to auscultation bilaterally with normal respiratory effort. CV:  Nondisplaced PMI.  Heart irregular S1/S2, no S3/S4, 3/6 HSM LLSB.  2+ edema 3/4 to knees bilaterally.  No carotid bruit.  Normal pedal pulses.  Abdomen: Soft, nontender, no hepatosplenomegaly, no distention.  Skin: Intact without lesions or rashes.  Neurologic: Alert and oriented x 3.  Psych: Normal affect. Extremities: No clubbing or cyanosis.  HEENT: Normal.   Assessment/Plan:  1. 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  This is a new finding on the 9/24 echo compared to 10/23 echo.  Echo in 9/24 showed EF 60-65%, mild LVH, D-shaped interventricular septum with severe RV enlargement, mild RV dysfunction, PASP 50 mmHg, TAVR valve with mean gradient 18 mmHg, mild MR, severe TR, dilated IVC. TEE in 11/24 showed EF 55-60%, mild LVH, mildly D-shaped septum with severe RV enlargement  but normal RV systolic function, severe biatrial enlargement, severe TR (suspect atrial functional TR), bioprosthetic aortic valve s/p TAVR with no evidence for thrombus/pannus, trivial perivalvular leakage, and mean gradient down to 13 mmHg.   RHC in 11/24 showed signs of RV failure with elevated RA pressure though PAPi was preserved at 2.8.  He is volume overloaded on exam today with worsened symptoms, NYHA class III.  - Increase torsemide to 60 mg daily.  He will also take metolazone 2.5 mg once weekly on Wednesdays with the am torsemide that day.  Take and extra 20 mEq KCl on metolazone days.  BMET/BNP today, BMET 10 days.  - I will have him start spironolactone 12.5 daily.   - He was unable to tolerate [name] due to side effects.   - Continue daily compression stockings to help w/ LEE  2. Permanent Atrial Fibrillation: rate controlled, HR 70s.  - on Eliquis, check CBC today.  3. Aortic Stenosis s/p TAVR:  stable on last TEE 11/24, mean gradient 13 mmHg.  4. Hypertension: BP controlled.  5. Tricuspid regurgitation: This was severe on echo in  9/24 with dilated RV and D-shaped interventricular septum.  TEE in 11/24 showed severe TR, possibly atrial functional TR with dilated right atrium. The RV on 11/24 TEE is severely dilated though overall systolic function appears normal.  - He has been referred to Dr. Art Buff at [facility] for evaluation for percutaneous tricuspid valve repair. Unfortunately he missed his recent appt due to winter weather. This has now been rescheduled to 09/29/23.  6. Coronary risk: Patient would like a coronary calcium score.  I will arrange.   Followup 2 wks with APP.   I spent 31 minutes reviewing records, interviewing/examining patient, and managing orders.  Marca Ancona  09/12/2023

## 2023-09-18 ENCOUNTER — Ambulatory Visit (HOSPITAL_COMMUNITY)
Admission: RE | Admit: 2023-09-18 | Discharge: 2023-09-18 | Disposition: A | Discharge status: Home / Self Care | Source: Ambulatory Visit | Attending: Internal Medicine | Admitting: Internal Medicine

## 2023-09-18 DIAGNOSIS — I5042 Chronic combined systolic (congestive) and diastolic (congestive) heart failure: Secondary | ICD-10-CM | POA: Insufficient documentation

## 2023-09-18 LAB — CBC
HCT: 33.6 % — ABNORMAL LOW (ref 39.0–52.0)
Hemoglobin: 9.7 g/dL — ABNORMAL LOW (ref 13.0–17.0)
MCH: 24 pg — ABNORMAL LOW (ref 26.0–34.0)
MCHC: 28.9 g/dL — ABNORMAL LOW (ref 30.0–36.0)
MCV: 83.2 fL (ref 80.0–100.0)
Platelets: 145 10*3/uL — ABNORMAL LOW (ref 150–400)
RBC: 4.04 MIL/uL — ABNORMAL LOW (ref 4.22–5.81)
RDW: 18.7 % — ABNORMAL HIGH (ref 11.5–15.5)
WBC: 5.6 10*3/uL (ref 4.0–10.5)
nRBC: 0 % (ref 0.0–0.2)

## 2023-09-18 LAB — BASIC METABOLIC PANEL WITH GFR
Anion gap: 7 (ref 5–15)
BUN: 32 mg/dL — ABNORMAL HIGH (ref 8–23)
CO2: 33 mmol/L — ABNORMAL HIGH (ref 22–32)
Calcium: 8.8 mg/dL — ABNORMAL LOW (ref 8.9–10.3)
Chloride: 99 mmol/L (ref 98–111)
Creatinine, Ser: 1.34 mg/dL — ABNORMAL HIGH (ref 0.61–1.24)
GFR, Estimated: 53 mL/min — ABNORMAL LOW (ref >60)
Glucose, Bld: 135 mg/dL — ABNORMAL HIGH (ref 70–99)
Potassium: 4.1 mmol/L (ref 3.5–5.1)
Sodium: 139 mmol/L (ref 135–145)

## 2023-09-18 LAB — BRAIN NATRIURETIC PEPTIDE: B Natriuretic Peptide: 535.8 pg/mL — ABNORMAL HIGH (ref 0.0–100.0)

## 2023-09-19 ENCOUNTER — Other Ambulatory Visit

## 2023-09-20 ENCOUNTER — Ambulatory Visit (HOSPITAL_COMMUNITY)
Admission: RE | Admit: 2023-09-20 | Discharge: 2023-09-20 | Disposition: A | Discharge status: Home / Self Care | Payer: Self-pay | Source: Ambulatory Visit | Attending: Cardiology | Admitting: Cardiology

## 2023-09-20 DIAGNOSIS — I5042 Chronic combined systolic (congestive) and diastolic (congestive) heart failure: Secondary | ICD-10-CM | POA: Insufficient documentation

## 2023-09-24 ENCOUNTER — Telehealth (HOSPITAL_COMMUNITY): Payer: Self-pay | Admitting: Cardiology

## 2023-09-24 MED ORDER — ROSUVASTATIN CALCIUM 10 MG PO TABS: 10.0000 mg | ORAL_TABLET | Freq: Every day | ORAL | 11 refills | Status: DC

## 2023-09-24 NOTE — Telephone Encounter (Signed)
 Devin Morale, MD to [facility] Triage Pool      09/21/23  9:04 AM Result Note Calcium score elevated at 675.  Suggests a moderate amount of cholesterol plaque in the coronaries with moderately elevated risk. Would aim for LDL < 55, his last was 85.  Suggest starting Crestor 10 mg daily with lipids/LFTs in 2 months   -pt aware

## 2023-10-01 ENCOUNTER — Encounter (HOSPITAL_COMMUNITY)

## 2023-10-09 ENCOUNTER — Ambulatory Visit (HOSPITAL_COMMUNITY)
Admission: RE | Admit: 2023-10-09 | Discharge: 2023-10-09 | Disposition: A | Discharge status: Home / Self Care | Source: Ambulatory Visit | Attending: Family Medicine | Admitting: Family Medicine

## 2023-10-09 ENCOUNTER — Encounter (HOSPITAL_COMMUNITY): Payer: Self-pay

## 2023-10-09 VITALS — BP 118/62 | HR 60 | Wt 207.6 lb

## 2023-10-09 DIAGNOSIS — Z7901 Long term (current) use of anticoagulants: Secondary | ICD-10-CM | POA: Diagnosis not present

## 2023-10-09 DIAGNOSIS — I071 Rheumatic tricuspid insufficiency: Secondary | ICD-10-CM

## 2023-10-09 DIAGNOSIS — I5032 Chronic diastolic (congestive) heart failure: Secondary | ICD-10-CM | POA: Diagnosis not present

## 2023-10-09 DIAGNOSIS — I10 Essential (primary) hypertension: Secondary | ICD-10-CM | POA: Diagnosis not present

## 2023-10-09 DIAGNOSIS — Z952 Presence of prosthetic heart valve: Secondary | ICD-10-CM | POA: Diagnosis not present

## 2023-10-09 DIAGNOSIS — I4821 Permanent atrial fibrillation: Secondary | ICD-10-CM | POA: Diagnosis not present

## 2023-10-09 DIAGNOSIS — I2510 Atherosclerotic heart disease of native coronary artery without angina pectoris: Secondary | ICD-10-CM | POA: Diagnosis not present

## 2023-10-09 DIAGNOSIS — I110 Hypertensive heart disease with heart failure: Secondary | ICD-10-CM | POA: Diagnosis not present

## 2023-10-09 DIAGNOSIS — I5033 Acute on chronic diastolic (congestive) heart failure: Secondary | ICD-10-CM | POA: Diagnosis present

## 2023-10-09 DIAGNOSIS — Z79899 Other long term (current) drug therapy: Secondary | ICD-10-CM | POA: Diagnosis not present

## 2023-10-09 DIAGNOSIS — I082 Rheumatic disorders of both aortic and tricuspid valves: Secondary | ICD-10-CM | POA: Diagnosis not present

## 2023-10-09 DIAGNOSIS — R931 Abnormal findings on diagnostic imaging of heart and coronary circulation: Secondary | ICD-10-CM

## 2023-10-09 LAB — BASIC METABOLIC PANEL WITH GFR
Anion gap: 12 (ref 5–15)
BUN: 35 mg/dL — ABNORMAL HIGH (ref 8–23)
CO2: 33 mmol/L — ABNORMAL HIGH (ref 22–32)
Calcium: 8.8 mg/dL — ABNORMAL LOW (ref 8.9–10.3)
Chloride: 94 mmol/L — ABNORMAL LOW (ref 98–111)
Creatinine, Ser: 1.56 mg/dL — ABNORMAL HIGH (ref 0.61–1.24)
GFR, Estimated: 44 mL/min — ABNORMAL LOW (ref >60)
Glucose, Bld: 108 mg/dL — ABNORMAL HIGH (ref 70–99)
Potassium: 4.3 mmol/L (ref 3.5–5.1)
Sodium: 139 mmol/L (ref 135–145)

## 2023-10-09 LAB — BRAIN NATRIURETIC PEPTIDE: B Natriuretic Peptide: 531.4 pg/mL — ABNORMAL HIGH (ref 0.0–100.0)

## 2023-10-09 LAB — MAGNESIUM: Magnesium: 2.3 mg/dL (ref 1.7–2.4)

## 2023-10-09 MED ORDER — SPIRONOLACTONE 25 MG PO TABS: 12.5000 mg | ORAL_TABLET | Freq: Every day | ORAL | 3 refills | Status: DC

## 2023-10-09 MED ORDER — POTASSIUM CITRATE ER 10 MEQ (1080 MG) PO TBCR: 20.0000 meq | EXTENDED_RELEASE_TABLET | Freq: Two times a day (BID) | ORAL | 11 refills | Status: DC

## 2023-10-09 NOTE — Addendum Note (Signed)
 Encounter addended by: Edman Gory, RN on: 10/09/2023 4:05 PM  Actions taken: Clinical Note Signed

## 2023-10-09 NOTE — Progress Notes (Signed)
 Provided patient with Furoscix  sample:  80 mg - one dose  LN [id number]  Exp 4/30

## 2023-10-09 NOTE — Progress Notes (Signed)
 ReDS Vest / Clip - 10/09/23 1000       ReDS Vest / Clip   Station Marker C    Ruler Value 29    ReDS Value Range High volume overload    ReDS Actual Value 48

## 2023-10-09 NOTE — Patient Instructions (Addendum)
 Start Spiro 12.5 mg daily - Rx sent. Furoscix  80 mg daily for three days plus potassium 40 meq on those days. DO NOT TAKE TORSEMIDE  WHILE USING THIS MEDICATION After completing Furoscix , return to taking torsemide  60 mg daily and DECREASE POTASSIUM TO 40 MEQ DAILY. Labs today - will call you if abnormal. Wear your TED hose daily. Return to Heart Failure APP Clinic in one week - see below. Please call us  at [phone] if any questions or concerns prior to your next appointment.    Your provider has order Furoscix  for you. This is an on-body infuser that gives you a dose of Furosemide .   It will be shipped to your home   [employer]  [employer] will call you to discuss before shipping so, PLEASE answer unknown calls  For questions regarding the device call [employer]  [facility] at [phone]  Ensure you write down the time you start your infusion so that if there is a problem you will know how long the infusion lasted  Use Furoscix  only AS DIRECTED by our office  Dosing Directions:   Day 1= 80 mg  Day 2= 80 mg  Day 3= 80 mg   Provided patient education on Furoscix  using demo kits and Furoscix  video, QR code provided on AVS for further viewing. Furoscix  order submitted online, ov note and ins card uploaded to [employer]  [facility].

## 2023-10-09 NOTE — Progress Notes (Addendum)
 [facility]    PCP: Sari Cunning, MD HF Cardiology: Dr. Mitzie Anda  83 y.o. with history of permanent atrial fibrillation, aortic stenosis s/p TAVR, and CHF. In 9/23, patient had TAVR for severe AS with 29 mm Edwards Sapien 3 THV.  Prior to TAVR, he had LHC in 8/23 showing minimal CAD.  Initial post-TAVR echo in 10/23 showed EF 60-65%, normal RV, trivial MR, TAVR valve with no PVL and mean gradient 15 mmHg.  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 Of note, patient was not taking Eliquis  at at time of 9/24 echo.  He had been concerned about bruising.  He started taking apixaban  in 10/24.   TEE was done in 11/24, showing EF 55-60%, mild LVH, mildly D-shaped septum with severe RV enlargement but normal RV systolic function, severe biatrial enlargement, severe TR (suspect atrial functional TR), bioprosthetic aortic valve s/p TAVR with no evidence for thrombus/pannus, trivial perivalvular leakage, and mean gradient down to 13 mmHg.  RHC in 11/24 showed elevated right-sided filling pressures with mild pulmonary hypertension and preserved PAPi. Torsemide  was increased to 40 qam/20 qpm.   Patient self-discontinued Farxiga  given development of joint pain/ muscle aches.   Consult with Dr. Konrad Persia at [facility] 09/2023, potential candidate for [facility]. Planning CTA, TEE and RHC for further work up.  Today he returns for HF follow up with his wife. Overall feeling fair. Breathing is worse. Swelling up to abdomen. Has occasional chest discomfort. Working out doing upper body exercise and weights every other day. Denies palpitations, abnormal bleeding, CP, dizziness, or PND/Orthopnea. Appetite ok. No fever or chills. Weight at home 204 pounds. Takes  nutritional supplements. Never started spiro, not taking metolazone . Unclear why he never started meds.  ReDs reading: 48%, abnormal  ECG (personally reviewed): none ordered today.  Labs (10/24): LDL 93, K 4.1, creatinine 1.1 => 1.28, hgb 13.7, BNP 485 Labs (12/24): K 4.3, creatinine 1.72 Labs (1/25): K 4.1, creatinine 1.33 Labs (2/25): K 3.9, creatinine 1.5  Labs (3/25): K 4.2, creatinine 1.27, BNP 511 Labs (4/25): K 4.1, creatinine 1.34  PMH: 1. HTN 2. Hyperlipidemia 3. Aortic stenosis: Severe. S/p TAVR in 9/23 with 29 mm Edwards Sapien 3 THV.  - Echo (10/23): EF 60-65%, normal RV, trivial MR, TAVR valve with no PVL and mean gradient 15 mmHg.  - Echo (9/24): EF 60-65%, mild LVH, D-shaped interventricular septum with severe RV enlargement, mild RV dysfunction, PASP 50 mmHg, TAVR valve with mean gradient 18 mmHg, mild MR, severe TR, dilated IVC.  - TEE (11/24): EF 55-60%, mild LVH, mildly D-shaped septum with severe RV enlargement but normal RV systolic function, severe biatrial enlargement, severe TR (suspect atrial functional TR), bioprosthetic aortic valve s/p TAVR with no evidence for thrombus/pannus, trivial perivalvular leakage, and mean gradient down to 13 mmHg.  4. Tricuspid regurgitation: Severe.  Noted first on 9/24 echo.  TEE (11/24): EF 55-60%, mild LVH, mildly D-shaped septum with severe RV enlargement but normal RV systolic function, severe biatrial enlargement, severe TR (suspect atrial functional TR), bioprosthetic aortic valve s/p TAVR with no evidence for thrombus/pannus, trivial perivalvular leakage, and mean gradient down to 13 mmHg.  - RHC (11/24): Mean RA 10 with v-waves to 19; PA 42/14, mean 28; mean PCWP 10,  CI 4.74, PVR 1.7 WU, PAPi 2.8.  5. LHC (8/23) with minimal CAD (pre-TAVR).  6. 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   Family History  Problem Relation Age of Onset   Colon cancer  Mother    Alzheimer's disease Father    CVA Father    Diabetes Brother    ROS: All systems reviewed and negative except as per HPI.   Current Outpatient Medications  Medication Sig Dispense Refill   amoxicillin  (AMOXIL ) 500 MG tablet Take 4 tablets (2,000 mg total) by mouth as directed. 1 HOUR PRIOR TO DENTAL APPOINTMENTS 12 tablet 6   apixaban  (ELIQUIS ) 5 MG TABS tablet Take 1 tablet (5 mg total) by mouth 2 (two) times daily. 60 tablet 11   buprenorphine -naloxone  (SUBOXONE ) 8-2 mg SUBL SL tablet Place 2 tablets under the tongue daily.     clonazePAM  (KLONOPIN ) 0.5 MG tablet Take 0.5 mg by mouth 3 (three) times daily as needed for anxiety.     Collagen Hydrolysate POWD Take 3 applicators by mouth 2 (two) times daily.     Digestive Enzyme CAPS Take 1 capsule by mouth 2 (two) times daily.     Multiple Vitamin (MULTIVITAMIN WITH MINERALS) TABS tablet Take 3 tablets by mouth daily. Total minerals     potassium citrate  (UROCIT-K ) 10 MEQ (1080 MG) SR tablet Take 20 mEq by mouth 3 (three) times daily with meals.     Probiotic Product (PROBIOTIC PO) Take 1 capsule by mouth 2 (two) times daily. total flora     testosterone  cypionate (DEPOTESTOSTERONE CYPIONATE) 200 MG/ML injection Inject 300 mg into the muscle every 28 (twenty-eight) days.     torsemide  (DEMADEX ) 20 MG tablet Take 3 tablets (60 mg total) by mouth daily. 200 tablet 3   No current facility-administered medications for this encounter.   Wt Readings from Last 3 Encounters:  10/09/23 94.2 kg (207 lb 9.6 oz)  09/11/23 88.8 kg (195 lb 12.8 oz)  08/21/23 86.9 kg (191 lb 9.6 oz)   BP 118/62   Pulse 60   Wt 94.2 kg (207 lb 9.6 oz)   SpO2 99%   BMI 26.65 kg/m   Physical Exam: General:  NAD. No resp difficulty, walked into clinic HEENT: Normal Neck: Supple. JVP to jaw Cor: Regular rate & rhythm. No rubs, gallops, 2/6 SEM LLSB Lungs: Clear, diminished in bases Abdomen: Soft, nontender, + distended.  Extremities: No cyanosis,  clubbing, rash, 3+ BLE edema to thighs Neuro: Alert & oriented x 3, moves all 4 extremities w/o difficulty. Affect pleasant.  Assessment/Plan: 1. 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  This is a new finding on the 9/24 echo compared to 10/23 echo.  Echo in 9/24 showed EF 60-65%, mild LVH, D-shaped interventricular septum with severe RV enlargement, mild RV dysfunction, PASP 50 mmHg, TAVR valve with mean gradient 18 mmHg, mild MR, severe TR, dilated IVC. TEE in 11/24 showed EF 55-60%, mild LVH, mildly D-shaped septum with severe RV enlargement but normal RV systolic function, severe biatrial enlargement, severe TR (suspect atrial functional TR), bioprosthetic aortic valve s/p TAVR with no evidence for thrombus/pannus, trivial perivalvular leakage, and mean gradient down to 13 mmHg.   RHC in 11/24 showed signs of RV failure with elevated RA pressure though PAPi was preserved at 2.8.  He is markedly volume overloaded on exam today, ReDs 48% and weight up 16  lbs with worsened symptoms, NYHA class IIIb.  - I will have him start spironolactone  12.5 mg daily.   - He will use Furoscix  + 40 KCL daily x 3 days (hold torsemide  while using Furoscix ). BMET/BNP & Mag today, repeat labs at follow up next week. - After 3 days, restart torsemide  60 mg daily + 40 KCL daily.  - Will likely need weekly metolazone  going forward, but will hold off until next visit to re-assess. - He was unable to tolerate Farxiga  due to side effects.   - Continue daily compression stockings to help w/ LEE.  2. Permanent Atrial Fibrillation: rate controlled, HR 60s.  - On Eliquis , no bleeding issues. 3. Aortic Stenosis s/p TAVR:  stable on last TEE 11/24, mean gradient 13 mmHg.  4. Hypertension: BP controlled.  - Meds as above. 5. Tricuspid regurgitation: This was severe on echo in 9/24 with dilated RV and D-shaped interventricular septum.  TEE in 11/24  showed severe TR, possibly atrial functional TR with dilated right atrium. The RV on 11/24 TEE is severely dilated though overall systolic function appears normal.  - He has been Dr. Konrad Persia at [facility] for evaluation for percutaneous tricuspid valve repair. He has follow up next week. 6. 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 LDL goal < 55. - Continue Crestor  10 mg daily. Lipids/LFTs in 2 months.  Follow up next week with APP for fluid check (will need BMET and ECG).  Arlice Bene [facility] FNP-BC 10/09/2023

## 2023-10-10 ENCOUNTER — Telehealth (HOSPITAL_COMMUNITY): Payer: Self-pay | Admitting: Cardiology

## 2023-10-10 NOTE — Telephone Encounter (Signed)
 Pt agreeable to pick up sample kit to hold him over until he receives delivery, sample left at front desk  Medication Samples have been provided to the patient.  Drug name: Furoscix        Strength: 80mg         Qty: 1  LOT: [id number]  Exp.Date: 10/17/14  Dosing instructions: use 1 kit today as directed  The patient has been instructed regarding the correct time, dose, and frequency of taking this medication, including desired effects and most common side effects.   Juana Montini 10:10 AM 10/10/2023

## 2023-10-10 NOTE — Telephone Encounter (Signed)
 Patient left VM on triage line stating furoscix  delivery will be delayed Reports he used one 4/22 Delivery is set for #1 unit 4/24    Pt will need diuretic plan while shipments are ironed out.    Confirmed with Furoscix  Direct pharmacy-application is in process and in que for pharmD review. This means a pharmacist will reach out to the patient and review meds and arrange delivery. Potential delivery date 4/25

## 2023-10-16 ENCOUNTER — Telehealth (HOSPITAL_COMMUNITY): Payer: Self-pay

## 2023-10-16 NOTE — Progress Notes (Signed)
 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 In 9/23, patient had TAVR for severe AS with 29 mm Edwards Sapien 3 THV.  Prior to TAVR, he had LHC in 8/23 showing minimal CAD.  Initial post-TAVR echo in 10/23 showed EF 60-65%, normal RV, trivial MR, TAVR valve with no PVL and mean gradient 15 mmHg.  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 Of note, patient was not taking Eliquis  at at time of 9/24 echo.  He had been concerned about bruising.  He started taking apixaban  in 10/24.   TEE 11/24: EF 55-60%, mild LVH, mildly D-shaped septum with severe RV enlargement but normal RV systolic function, severe biatrial enlargement, severe TR (suspect atrial functional TR), bioprosthetic aortic valve s/p TAVR with no evidence for thrombus/pannus, trivial perivalvular leakage, and mean gradient down to 13 mmHg.  RHC 11/24 showed elevated right-sided filling pressures with mild pulmonary hypertension and preserved PAPi. Torsemide  was increased to 40 qam/20 qpm.   Patient self-discontinued Farxiga  given development of joint pain/ muscle aches.   Consult with Dr. Konrad Persia at [facility] 09/2023, potential candidate for [facility]. Planning CTA, TEE and RHC for further work up.  Today he returns for HF follow up with his wife. Overall feeling fair. Breathing is worse. Swelling up to abdomen. Has occasional chest discomfort. Working out doing upper body exercise and weights every other day. Denies palpitations, abnormal bleeding, CP, dizziness, or PND/Orthopnea. Appetite ok. No fever or chills. Weight at home 204 pounds. Takes nutritional supplements.  Never started spiro, not taking metolazone . Unclear why he never started meds.  ReDs reading: *** %, {Norm/abn:16337}  ECG (personally reviewed): ***   PMH: 1. HTN 2. Hyperlipidemia 3. Aortic stenosis: Severe. S/p TAVR in 9/23 with 29 mm Edwards Sapien 3 THV.  - Echo (10/23): EF 60-65%, normal RV, trivial MR, TAVR valve with no PVL and mean gradient 15 mmHg.  - Echo (9/24): EF 60-65%, mild LVH, D-shaped interventricular septum with severe RV enlargement, mild RV dysfunction, PASP 50 mmHg, TAVR valve with mean gradient 18 mmHg, mild MR, severe TR, dilated IVC.  - TEE (11/24): EF 55-60%, mild LVH, mildly D-shaped septum with severe RV enlargement but normal RV systolic function, severe biatrial enlargement, severe TR (suspect atrial functional TR), bioprosthetic aortic valve s/p TAVR with no evidence for thrombus/pannus, trivial perivalvular leakage, and mean gradient down to 13 mmHg.  4. Tricuspid regurgitation: Severe.  Noted first on 9/24 echo.  TEE (11/24): EF 55-60%, mild LVH, mildly D-shaped septum with severe RV enlargement but normal RV systolic function, severe biatrial enlargement, severe TR (suspect atrial functional TR), bioprosthetic aortic valve s/p TAVR with no evidence for thrombus/pannus, trivial perivalvular leakage, and mean gradient down to 13 mmHg.  - RHC (11/24): Mean RA 10 with v-waves to 19; PA 42/14, mean 28; mean PCWP 10, CI 4.74, PVR 1.7 WU, PAPi 2.8.  5. LHC (8/23) with minimal CAD (pre-TAVR).  6. 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   Family History  Problem Relation Age of Onset   Colon cancer Mother    Alzheimer's  disease Father    CVA Father    Diabetes Brother    ROS: All systems reviewed and negative except as per HPI.   Current Outpatient Medications  Medication Sig Dispense Refill   amoxicillin  (AMOXIL ) 500 MG tablet Take 4 tablets (2,000 mg total) by mouth as directed. 1  HOUR PRIOR TO DENTAL APPOINTMENTS 12 tablet 6   apixaban  (ELIQUIS ) 5 MG TABS tablet Take 1 tablet (5 mg total) by mouth 2 (two) times daily. 60 tablet 11   buprenorphine -naloxone  (SUBOXONE ) 8-2 mg SUBL SL tablet Place 2 tablets under the tongue daily.     clonazePAM  (KLONOPIN ) 0.5 MG tablet Take 0.5 mg by mouth 3 (three) times daily as needed for anxiety.     Collagen Hydrolysate POWD Take 3 applicators by mouth 2 (two) times daily.     Digestive Enzyme CAPS Take 1 capsule by mouth 2 (two) times daily.     Multiple Vitamin (MULTIVITAMIN WITH MINERALS) TABS tablet Take 3 tablets by mouth daily. Total minerals     potassium citrate  (UROCIT-K ) 10 MEQ (1080 MG) SR tablet Take 2 tablets (20 mEq total) by mouth in the morning and at bedtime. 120 tablet 11   Probiotic Product (PROBIOTIC PO) Take 1 capsule by mouth 2 (two) times daily. total flora     spironolactone  (ALDACTONE ) 25 MG tablet Take 0.5 tablets (12.5 mg total) by mouth daily. 45 tablet 3   testosterone  cypionate (DEPOTESTOSTERONE CYPIONATE) 200 MG/ML injection Inject 300 mg into the muscle every 28 (twenty-eight) days.     torsemide  (DEMADEX ) 20 MG tablet Take 3 tablets (60 mg total) by mouth daily. 200 tablet 3   No current facility-administered medications for this visit.   Wt Readings from Last 3 Encounters:  10/09/23 94.2 kg (207 lb 9.6 oz)  09/11/23 88.8 kg (195 lb 12.8 oz)  08/21/23 86.9 kg (191 lb 9.6 oz)   There were no vitals taken for this visit.  Physical Exam: General:  *** appearing.  No respiratory difficulty HEENT: normal Neck: supple. JVD *** cm.  Cor: PMI nondisplaced. Regular rate & rhythm. No rubs, gallops or murmurs. Lungs: clear Abdomen: soft, nontender, nondistended. Good bowel sounds. Extremities: no cyanosis, clubbing, rash, edema  Neuro: alert & oriented x 3. Moves all 4 extremities w/o difficulty. Affect pleasant.   Assessment/Plan: 1. 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  This is a new finding on the 9/24 echo compared to 10/23 echo.  Echo in 9/24 showed EF 60-65%, mild LVH, D-shaped interventricular septum with severe RV enlargement, mild RV dysfunction, PASP 50 mmHg, TAVR valve with mean gradient 18 mmHg, mild MR, severe TR, dilated IVC. TEE in 11/24 showed EF 55-60%, mild LVH, mildly D-shaped septum with severe RV enlargement but normal RV systolic function, severe biatrial enlargement, severe TR (suspect atrial functional TR), bioprosthetic aortic valve s/p TAVR with no evidence for thrombus/pannus, trivial perivalvular leakage, and mean gradient down to 13 mmHg.   RHC in 11/24 showed signs of RV failure with elevated RA pressure though PAPi was preserved at 2.8.   - He is markedly volume overloaded on exam today, ReDs 48% and weight up 16 lbs with worsened symptoms, NYHA class IIIb. *** - I will have him start spironolactone  12.5 mg daily.   - He will use Furoscix  + 40 KCL daily x 3 days (hold torsemide  while using Furoscix ). BMET/BNP & Mag today, repeat labs at follow up  next week. - After 3 days, restart torsemide  60 mg daily + 40 KCL daily.  - Will likely need weekly metolazone  going forward, but will hold off until next visit to re-assess. - He was unable to tolerate Farxiga  due to side effects.   - Continue daily compression stockings to help w/ LEE.  2. Permanent Atrial Fibrillation: rate controlled, HR 60s.  - On Eliquis , no bleeding issues. 3. Aortic Stenosis s/p TAVR:  stable on last TEE 11/24, mean gradient 13 mmHg.  4. Hypertension: BP controlled.  - Meds as above. 5. Tricuspid regurgitation: This was severe on echo in 9/24 with dilated RV and D-shaped interventricular septum.  TEE in 11/24 showed severe TR, possibly atrial functional TR with dilated right atrium. The RV on 11/24 TEE is severely dilated though overall systolic function appears normal.  - He has been Dr. Konrad Persia at [facility] for evaluation for percutaneous tricuspid valve repair. He has follow up next week. 6. 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 LDL goal < 55. - Continue Crestor  10 mg daily. Lipids/LFTs in 2 months.  Follow up next week with APP for fluid check (will need BMET and ECG).  Sheryl Donna *** 10/16/2023

## 2023-10-16 NOTE — Telephone Encounter (Signed)
 Called to confirm/remind patient of their appointment at the Advanced Heart Failure Clinic on 10/17/2023 10:30.   Appointment:   [x] Confirmed  [] Left mess   [] No answer/No voice mail  [] VM Full/unable to leave message  [] Phone not in service  Patient reminded to bring all medications and/or complete list.  Confirmed patient has transportation. Gave directions, instructed to utilize valet parking.

## 2023-10-17 ENCOUNTER — Ambulatory Visit (HOSPITAL_COMMUNITY)
Admission: RE | Admit: 2023-10-17 | Discharge: 2023-10-17 | Disposition: A | Discharge status: Home / Self Care | Source: Ambulatory Visit | Attending: Internal Medicine | Admitting: Internal Medicine

## 2023-10-17 ENCOUNTER — Encounter (HOSPITAL_COMMUNITY): Payer: Self-pay

## 2023-10-17 VITALS — BP 135/61 | HR 62 | Ht 74.0 in | Wt 198.0 lb

## 2023-10-17 DIAGNOSIS — I5033 Acute on chronic diastolic (congestive) heart failure: Secondary | ICD-10-CM | POA: Insufficient documentation

## 2023-10-17 DIAGNOSIS — Z79899 Other long term (current) drug therapy: Secondary | ICD-10-CM | POA: Insufficient documentation

## 2023-10-17 DIAGNOSIS — I4821 Permanent atrial fibrillation: Secondary | ICD-10-CM | POA: Insufficient documentation

## 2023-10-17 DIAGNOSIS — I10 Essential (primary) hypertension: Secondary | ICD-10-CM | POA: Diagnosis not present

## 2023-10-17 DIAGNOSIS — Z952 Presence of prosthetic heart valve: Secondary | ICD-10-CM | POA: Diagnosis not present

## 2023-10-17 DIAGNOSIS — Z953 Presence of xenogenic heart valve: Secondary | ICD-10-CM | POA: Insufficient documentation

## 2023-10-17 DIAGNOSIS — Z7901 Long term (current) use of anticoagulants: Secondary | ICD-10-CM | POA: Insufficient documentation

## 2023-10-17 DIAGNOSIS — I081 Rheumatic disorders of both mitral and tricuspid valves: Secondary | ICD-10-CM | POA: Diagnosis not present

## 2023-10-17 DIAGNOSIS — I2510 Atherosclerotic heart disease of native coronary artery without angina pectoris: Secondary | ICD-10-CM | POA: Diagnosis not present

## 2023-10-17 DIAGNOSIS — I110 Hypertensive heart disease with heart failure: Secondary | ICD-10-CM | POA: Diagnosis not present

## 2023-10-17 DIAGNOSIS — I071 Rheumatic tricuspid insufficiency: Secondary | ICD-10-CM

## 2023-10-17 DIAGNOSIS — R931 Abnormal findings on diagnostic imaging of heart and coronary circulation: Secondary | ICD-10-CM

## 2023-10-17 DIAGNOSIS — I5023 Acute on chronic systolic (congestive) heart failure: Secondary | ICD-10-CM

## 2023-10-17 LAB — BASIC METABOLIC PANEL WITH GFR
Anion gap: 9 (ref 5–15)
BUN: 38 mg/dL — ABNORMAL HIGH (ref 8–23)
CO2: 32 mmol/L (ref 22–32)
Calcium: 9.7 mg/dL (ref 8.9–10.3)
Chloride: 99 mmol/L (ref 98–111)
Creatinine, Ser: 1.33 mg/dL — ABNORMAL HIGH (ref 0.61–1.24)
GFR, Estimated: 54 mL/min — ABNORMAL LOW (ref >60)
Glucose, Bld: 114 mg/dL — ABNORMAL HIGH (ref 70–99)
Potassium: 4.3 mmol/L (ref 3.5–5.1)
Sodium: 140 mmol/L (ref 135–145)

## 2023-10-17 LAB — BRAIN NATRIURETIC PEPTIDE: B Natriuretic Peptide: 598.9 pg/mL — ABNORMAL HIGH (ref 0.0–100.0)

## 2023-10-17 MED ORDER — ROSUVASTATIN CALCIUM 10 MG PO TABS: 10.0000 mg | ORAL_TABLET | Freq: Every day | ORAL | 6 refills | Status: DC

## 2023-10-17 NOTE — Patient Instructions (Addendum)
 Start rosuvastatin  10 mg daily - Rx sent. Labs today - will call you if abnormal. Take Furoscix  80 mg for three days. Take an extra 40 meq potassium each day. Hold your torsemide  these days. Re-start torsemide  60 mg daily after you complete your Furoscix .  Return to Heart Failure APP Clinic in one week - see below.  Please call us  at [phone] if any questions or concerns prior to your next visit.

## 2023-10-17 NOTE — Progress Notes (Signed)
 ReDS Vest / Clip - 10/17/23 1000       ReDS Vest / Clip   Station Marker C    Ruler Value 26    ReDS Value Range Moderate volume overload    ReDS Actual Value 41

## 2023-10-22 NOTE — Progress Notes (Incomplete)
 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 In 9/23, patient had TAVR for severe AS with 29 mm Edwards Sapien 3 THV.  Prior to TAVR, he had LHC in 8/23 showing minimal CAD.  Initial post-TAVR echo in 10/23 showed EF 60-65%, normal RV, trivial MR, TAVR valve with no PVL and mean gradient 15 mmHg.  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 Of note, patient was not taking Eliquis  at at time of 9/24 echo.  He had been concerned about bruising.  He started taking apixaban  in 10/24.   TEE 11/24: EF 55-60%, mild LVH, mildly D-shaped septum with severe RV enlargement but normal RV systolic function, severe biatrial enlargement, severe TR (suspect atrial functional TR), bioprosthetic aortic valve s/p TAVR with no evidence for thrombus/pannus, trivial perivalvular leakage, and mean gradient down to 13 mmHg.  RHC 11/24 showed elevated right-sided filling pressures with mild pulmonary hypertension and preserved PAPi. Torsemide  was increased to 40 qam/20 qpm.   Patient self-discontinued Farxiga  given development of joint pain/ muscle aches.   Consult with Dr. Konrad Persia at [facility] 09/2023, potential candidate for [facility]. Planning CTA, TEE and RHC for further work up.  AHF clinic 4/22: Volume overloaded. Given 3 days of furoscix . Seen again in AHF clinic 4/30 with persistent volume overload but good response to furoscix . Given 3 more days.   Today he returns for AHF follow up. Overall feeling ***. Denies palpitations, CP, dizziness, edema, or PND/Orthopnea. *** SOB. Appetite ok. No fever or chills. Weight at home *** pounds. Taking all  medications. Denies ETOH, tobacco or drug use.   ReDs reading: *** %, {Norm/abn:16337}   ECG (personally reviewed from 10/17/23): a fib with PVCs 62 bpm   PMH: 1. HTN 2. Hyperlipidemia 3. Aortic stenosis: Severe. S/p TAVR in 9/23 with 29 mm Edwards Sapien 3 THV.  - Echo (10/23): EF 60-65%, normal RV, trivial MR, TAVR valve with no PVL and mean gradient 15 mmHg.  - Echo (9/24): EF 60-65%, mild LVH, D-shaped interventricular septum with severe RV enlargement, mild RV dysfunction, PASP 50 mmHg, TAVR valve with mean gradient 18 mmHg, mild MR, severe TR, dilated IVC.  - TEE (11/24): EF 55-60%, mild LVH, mildly D-shaped septum with severe RV enlargement but normal RV systolic function, severe biatrial enlargement, severe TR (suspect atrial functional TR), bioprosthetic aortic valve s/p TAVR with no evidence for thrombus/pannus, trivial perivalvular leakage, and mean gradient down to 13 mmHg.  4. Tricuspid regurgitation: Severe.  Noted first on 9/24 echo.  TEE (11/24): EF 55-60%, mild LVH, mildly D-shaped septum with severe RV enlargement but normal RV systolic function, severe biatrial enlargement, severe TR (suspect atrial functional TR), bioprosthetic aortic valve s/p TAVR with no evidence for thrombus/pannus, trivial perivalvular leakage, and mean gradient down to 13 mmHg.  - RHC (11/24): Mean RA 10 with v-waves to 19; PA 42/14, mean 28; mean PCWP 10, CI 4.74, PVR 1.7 WU, PAPi 2.8.  5. LHC (8/23) with minimal CAD (pre-TAVR).  6. 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   Family History  Problem Relation Age of  Onset   Colon cancer Mother    Alzheimer's disease Father    CVA Father    Diabetes Brother    ROS: All systems reviewed and negative except as per HPI.   Current Outpatient Medications  Medication Sig Dispense Refill   amoxicillin  (AMOXIL ) 500 MG tablet Take 4 tablets (2,000 mg total) by mouth as directed. 1  HOUR PRIOR TO DENTAL APPOINTMENTS 12 tablet 6   apixaban  (ELIQUIS ) 5 MG TABS tablet Take 1 tablet (5 mg total) by mouth 2 (two) times daily. 60 tablet 11   buprenorphine -naloxone  (SUBOXONE ) 8-2 mg SUBL SL tablet Place 2 tablets under the tongue daily.     clonazePAM  (KLONOPIN ) 0.5 MG tablet Take 0.5 mg by mouth 3 (three) times daily as needed for anxiety.     Collagen Hydrolysate POWD Take 3 applicators by mouth 2 (two) times daily.     Digestive Enzyme CAPS Take 1 capsule by mouth 2 (two) times daily.     Multiple Vitamin (MULTIVITAMIN WITH MINERALS) TABS tablet Take 3 tablets by mouth daily. Total minerals     potassium citrate  (UROCIT-K ) 10 MEQ (1080 MG) SR tablet Take 2 tablets (20 mEq total) by mouth in the morning and at bedtime. 120 tablet 11   Probiotic Product (PROBIOTIC PO) Take 1 capsule by mouth 2 (two) times daily. total flora     rosuvastatin  (CRESTOR ) 10 MG tablet Take 1 tablet (10 mg total) by mouth daily. 30 tablet 6   spironolactone  (ALDACTONE ) 25 MG tablet Take 0.5 tablets (12.5 mg total) by mouth daily. 45 tablet 3   testosterone  cypionate (DEPOTESTOSTERONE CYPIONATE) 200 MG/ML injection Inject 300 mg into the muscle every 28 (twenty-eight) days.     torsemide  (DEMADEX ) 20 MG tablet Take 3 tablets (60 mg total) by mouth daily. 200 tablet 3   No current facility-administered medications for this visit.   Wt Readings from Last 3 Encounters:  10/17/23 89.8 kg (198 lb)  10/09/23 94.2 kg (207 lb 9.6 oz)  09/11/23 88.8 kg (195 lb 12.8 oz)   There were no vitals taken for this visit.  Physical Exam: General:  *** appearing.  No respiratory difficulty Neck: supple. JVD *** cm.  Cor: PMI nondisplaced. Regular rate & rhythm. No rubs, gallops or murmurs. Lungs: clear Extremities: no cyanosis, clubbing, rash, edema  Neuro: alert & oriented x 3. Moves all 4 extremities w/o difficulty. Affect pleasant.   Assessment/Plan: 1. Acute on Chronic Diastolic Heart Failure w/ Prominent  RV Dysfunction w/ Severe TR: Prominent RV dysfunction with severe TR.  This is a new finding on the 9/24 echo compared to 10/23 echo.  Echo in 9/24 showed EF 60-65%, mild LVH, D-shaped interventricular septum with severe RV enlargement, mild RV dysfunction, PASP 50 mmHg, TAVR valve with mean gradient 18 mmHg, mild MR, severe TR, dilated IVC. TEE in 11/24 showed EF 55-60%, mild LVH, mildly D-shaped septum with severe RV enlargement but normal RV systolic function, severe biatrial enlargement, severe TR (suspect atrial functional TR), bioprosthetic aortic valve s/p TAVR with no evidence for thrombus/pannus, trivial perivalvular leakage, and mean gradient down to 13 mmHg.   RHC 11/24: signs of RV failure with elevated RA pressure though PAPi was preserved at 2.8.   - He is still markedly volume overloaded on exam today, NYHA IIIa, ReDs 41%. He responded very well to furoscix , down 9 lbs. Will repeat Furoscix  again for another 3 days + 40 mEq KDUR. Knows to hold Torsemide  while using Torsemide . *** - Want to  avoid over diuresis with RV failure but still markedly volume overloaded. Instructed to avoid salt.  - After 3 days, restart torsemide  60 mg daily + 40 KCL daily.  Weekly metolazone  *** ?  - Continue spironolactone  12.5 mg daily.   - Will likely need weekly metolazone  going forward, but will hold off until next visit to re-assess. - He was unable to tolerate Farxiga  due to side effects.   - Continue daily compression stockings to help w/ LEE.   2. Permanent Atrial Fibrillation: rate controlled, HR 60s.  - On Eliquis , no bleeding issues. - EKG today stable  3. Aortic Stenosis s/p TAVR:  stable on last TEE 11/24, mean gradient 13 mmHg.   4. Hypertension: BP controlled.  - Meds as above.  5. Tricuspid regurgitation: This was severe on echo in 9/24 with dilated RV and D-shaped interventricular septum.  TEE in 11/24 showed severe TR, possibly atrial functional TR with dilated right atrium. The RV on  11/24 TEE is severely dilated though overall systolic function appears normal.  - He has been Dr. Konrad Persia at [facility] for evaluation for percutaneous tricuspid valve repair. Has TEE coming up 5/8. Ideally would like to see him prior to procedure but he's unable to come in before then.   6. 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 LDL goal < 55. - Start Crestor  10 mg daily. Lipids/LFTs in 2 months.  Follow up ***  Sheryl Donna AGACNP-BC  10/22/2023

## 2023-10-24 ENCOUNTER — Ambulatory Visit: Payer: Medicare Other | Admitting: Oncology

## 2023-10-24 ENCOUNTER — Other Ambulatory Visit: Payer: Medicare Other

## 2023-10-26 ENCOUNTER — Telehealth (HOSPITAL_COMMUNITY): Payer: Self-pay | Admitting: Cardiology

## 2023-10-26 DIAGNOSIS — I5033 Acute on chronic diastolic (congestive) heart failure: Secondary | ICD-10-CM

## 2023-10-26 NOTE — Telephone Encounter (Signed)
 Just recently used furoscix . Now should be back on 60 mg Torsemide  daily.   He can increase torsemide  to 60 BID over the weekend and take 2.5 mg metolazone  X 1 tomorrow am. Increase K supp from 20 mEq BID to 40 mEq BID and take an extra 40 mEq with metolazone .  Please see if he can get sooner follow-up, Monday?

## 2023-10-26 NOTE — Telephone Encounter (Signed)
 Patient called to request "super pill". Was told med could be given to help with fluid Report BLE no SOB and increase in weight  Reports BLE never goes away, despite wearing compression socks. Reports when he does wear them the fluid will get stuck in abdominal area  Reports his weight is up however has not weighed in a few days   Please advise

## 2023-10-29 ENCOUNTER — Telehealth (HOSPITAL_COMMUNITY): Payer: Self-pay

## 2023-10-29 MED ORDER — METOLAZONE 2.5 MG PO TABS: 2.5000 mg | ORAL_TABLET | Freq: Every day | ORAL | 0 refills | Status: DC

## 2023-10-29 NOTE — Telephone Encounter (Signed)
 Pt aware  Would like to try med adjustment and stil keep fu as scheduled 5/13

## 2023-10-29 NOTE — Telephone Encounter (Signed)
 Called to confirm/remind patient of their appointment at the Advanced Heart Failure Clinic on 10/30/2023 9:30.   Appointment:   [] Confirmed  [x] Left mess   [] No answer/No voice mail  [] VM Full/unable to leave message  [] Phone not in service  Patient reminded to bring all medications and/or complete list.  Confirmed patient has transportation. Gave directions, instructed to utilize valet parking.

## 2023-10-30 ENCOUNTER — Ambulatory Visit (HOSPITAL_COMMUNITY)
Admission: RE | Admit: 2023-10-30 | Discharge: 2023-10-30 | Disposition: A | Discharge status: Home / Self Care | Source: Ambulatory Visit | Attending: Internal Medicine | Admitting: Internal Medicine

## 2023-10-30 ENCOUNTER — Encounter (HOSPITAL_COMMUNITY): Payer: Self-pay

## 2023-10-30 ENCOUNTER — Ambulatory Visit (HOSPITAL_COMMUNITY): Payer: Self-pay | Admitting: Internal Medicine

## 2023-10-30 ENCOUNTER — Telehealth (HOSPITAL_COMMUNITY): Payer: Self-pay

## 2023-10-30 ENCOUNTER — Other Ambulatory Visit (HOSPITAL_COMMUNITY): Payer: Self-pay | Admitting: *Deleted

## 2023-10-30 ENCOUNTER — Encounter (HOSPITAL_COMMUNITY)
Admission: RE | Admit: 2023-10-30 | Discharge: 2023-10-30 | Disposition: A | Discharge status: Home / Self Care | Source: Ambulatory Visit | Attending: Internal Medicine | Admitting: Internal Medicine

## 2023-10-30 VITALS — BP 123/67 | HR 66 | Ht 74.0 in | Wt 199.0 lb

## 2023-10-30 DIAGNOSIS — I071 Rheumatic tricuspid insufficiency: Secondary | ICD-10-CM

## 2023-10-30 DIAGNOSIS — I5033 Acute on chronic diastolic (congestive) heart failure: Secondary | ICD-10-CM | POA: Insufficient documentation

## 2023-10-30 DIAGNOSIS — I2510 Atherosclerotic heart disease of native coronary artery without angina pectoris: Secondary | ICD-10-CM | POA: Diagnosis not present

## 2023-10-30 DIAGNOSIS — Z952 Presence of prosthetic heart valve: Secondary | ICD-10-CM | POA: Diagnosis not present

## 2023-10-30 DIAGNOSIS — Z79899 Other long term (current) drug therapy: Secondary | ICD-10-CM | POA: Diagnosis not present

## 2023-10-30 DIAGNOSIS — I082 Rheumatic disorders of both aortic and tricuspid valves: Secondary | ICD-10-CM | POA: Diagnosis not present

## 2023-10-30 DIAGNOSIS — I110 Hypertensive heart disease with heart failure: Secondary | ICD-10-CM | POA: Diagnosis not present

## 2023-10-30 DIAGNOSIS — Z7901 Long term (current) use of anticoagulants: Secondary | ICD-10-CM | POA: Insufficient documentation

## 2023-10-30 DIAGNOSIS — I10 Essential (primary) hypertension: Secondary | ICD-10-CM

## 2023-10-30 DIAGNOSIS — R931 Abnormal findings on diagnostic imaging of heart and coronary circulation: Secondary | ICD-10-CM

## 2023-10-30 DIAGNOSIS — I4821 Permanent atrial fibrillation: Secondary | ICD-10-CM | POA: Diagnosis not present

## 2023-10-30 LAB — BASIC METABOLIC PANEL WITH GFR
Anion gap: 6 (ref 5–15)
BUN: 34 mg/dL — ABNORMAL HIGH (ref 8–23)
CO2: 36 mmol/L — ABNORMAL HIGH (ref 22–32)
Calcium: 8 mg/dL — ABNORMAL LOW (ref 8.9–10.3)
Chloride: 96 mmol/L — ABNORMAL LOW (ref 98–111)
Creatinine, Ser: 1.5 mg/dL — ABNORMAL HIGH (ref 0.61–1.24)
GFR, Estimated: 46 mL/min — ABNORMAL LOW (ref >60)
Glucose, Bld: 109 mg/dL — ABNORMAL HIGH (ref 70–99)
Potassium: 4 mmol/L (ref 3.5–5.1)
Sodium: 138 mmol/L (ref 135–145)

## 2023-10-30 LAB — BRAIN NATRIURETIC PEPTIDE: B Natriuretic Peptide: 447.6 pg/mL — ABNORMAL HIGH (ref 0.0–100.0)

## 2023-10-30 MED ORDER — POTASSIUM CHLORIDE CRYS ER 20 MEQ PO TBCR: 40.0000 meq | EXTENDED_RELEASE_TABLET | Freq: Every day | ORAL | 3 refills | Status: DC

## 2023-10-30 MED ORDER — FUROSEMIDE 10 MG/ML IJ SOLN: 80.0000 mg | Freq: Once | INTRAMUSCULAR | Status: DC

## 2023-10-30 MED ORDER — TORSEMIDE 20 MG PO TABS: 60.0000 mg | ORAL_TABLET | Freq: Two times a day (BID) | ORAL | 3 refills | Status: DC

## 2023-10-30 MED ORDER — FUROSEMIDE 10 MG/ML IJ SOLN
80.0000 mg | Freq: Once | INTRAMUSCULAR | Status: AC
  Administered 2023-10-30: 80 mg via INTRAVENOUS

## 2023-10-30 MED ORDER — METOLAZONE 2.5 MG PO TABS: 2.5000 mg | ORAL_TABLET | Freq: Every day | ORAL | 0 refills | Status: DC

## 2023-10-30 MED ORDER — FUROSEMIDE 10 MG/ML IJ SOLN
INTRAMUSCULAR | Status: AC
  Filled 2023-10-30: qty 8

## 2023-10-30 NOTE — Patient Instructions (Signed)
 CHANGE torsemide  to 60 mg Twice daily  TAKE 2 Metolazone  tablets today and tomorrow along with an extra 2 potassium tablets.  INCREASE Potassium to 40 mEq ( 2 tab) daily.  Labs done today, your results will be available in MyChart, we will contact you for abnormal readings.  Your physician recommends that you schedule a follow-up appointment as scheduled.  If you have any questions or concerns before your next appointment please send us  a message through [place] or call our office at [phone].    TO LEAVE A MESSAGE FOR THE NURSE SELECT OPTION 2, PLEASE LEAVE A MESSAGE INCLUDING: YOUR NAME DATE OF BIRTH CALL BACK NUMBER REASON FOR CALL**this is important as we prioritize the call backs  YOU WILL RECEIVE A CALL BACK THE SAME DAY AS LONG AS YOU CALL BEFORE 4:00 PM  At the [facility], you and your health needs are our priority. As part of our continuing mission to provide you with exceptional heart care, we have created designated Provider Care Teams. These Care Teams include your primary Cardiologist (physician) and Advanced Practice Providers (APPs- Physician Assistants and Nurse Practitioners) who all work together to provide you with the care you need, when you need it.   You may see any of the following providers on your designated Care Team at your next follow up: Dr Jules Oar Dr Peder Bourdon Dr. Alwin Baars Dr. Arta Lark Amy Marijane Shoulders, NP Ruddy Corral, [place] [facility] [place], [place] Dennise Fitz, NP [place] Lee, NP Shawnee Dellen, NP Luster Salters, PharmD Bevely Brush, PharmD   Please be sure to bring in all your medications bottles to every appointment.    Thank you for choosing [facility]

## 2023-10-30 NOTE — Telephone Encounter (Signed)
 Pt aware and voiced understanding

## 2023-10-30 NOTE — Telephone Encounter (Signed)
 Left message for patient to inform him of his appointment tomorrow for the infusion clinic for IV Lasix .  His appointment is at 8.00 am as this is the only time they had. Please inform him that he needs to go to the main entrance of the hospital and check in there by 7.45 am

## 2023-10-30 NOTE — Progress Notes (Signed)
 ReDS Vest / Clip - 10/30/23 0900       ReDS Vest / Clip   Station Marker C    Ruler Value 27    ReDS Value Range High volume overload    ReDS Actual Value 42

## 2023-10-31 ENCOUNTER — Encounter (HOSPITAL_COMMUNITY)
Admission: RE | Admit: 2023-10-31 | Discharge: 2023-10-31 | Disposition: A | Discharge status: Home / Self Care | Source: Ambulatory Visit | Attending: Internal Medicine | Admitting: Internal Medicine

## 2023-10-31 ENCOUNTER — Other Ambulatory Visit (HOSPITAL_COMMUNITY): Payer: Self-pay | Admitting: Cardiology

## 2023-10-31 DIAGNOSIS — I5033 Acute on chronic diastolic (congestive) heart failure: Secondary | ICD-10-CM | POA: Diagnosis not present

## 2023-10-31 MED ORDER — FUROSEMIDE 10 MG/ML IJ SOLN
80.0000 mg | Freq: Once | INTRAMUSCULAR | Status: AC
  Administered 2023-10-31: 80 mg via INTRAVENOUS
  Filled 2023-10-31: qty 8

## 2023-10-31 MED ORDER — METOLAZONE 2.5 MG PO TABS: 2.5000 mg | ORAL_TABLET | Freq: Every day | ORAL | 0 refills | Status: DC

## 2023-10-31 MED ORDER — FUROSEMIDE 10 MG/ML IJ SOLN
INTRAMUSCULAR | Status: AC
  Filled 2023-10-31: qty 6

## 2023-11-06 ENCOUNTER — Telehealth (HOSPITAL_COMMUNITY): Payer: Self-pay

## 2023-11-06 NOTE — Telephone Encounter (Signed)
 Called to confirm/remind patient of their appointment at the [facility] Failure Clinic on 11/07/2023 9:30.   Appointment:   [] Confirmed  [x] Left mess   [] No answer/No voice mail  [] VM Full/unable to leave message  [] Phone not in service  Patient reminded to bring all medications and/or complete list.  Confirmed patient has transportation. Gave directions, instructed to utilize valet parking.

## 2023-11-07 ENCOUNTER — Encounter (HOSPITAL_COMMUNITY)

## 2023-11-07 NOTE — Progress Notes (Incomplete)
 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 In 9/23, patient had TAVR for severe AS with 29 mm Edwards Sapien 3 THV.  Prior to TAVR, he had LHC in 8/23 showing minimal CAD.  Initial post-TAVR echo in 10/23 showed EF 60-65%, normal RV, trivial MR, TAVR valve with no PVL and mean gradient 15 mmHg.  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 Of note, patient was not taking Eliquis  at at time of 9/24 echo.  He had been concerned about bruising.  He started taking apixaban  in 10/24.   TEE 11/24: EF 55-60%, mild LVH, mildly D-shaped septum with severe RV enlargement but normal RV systolic function, severe biatrial enlargement, severe TR (suspect atrial functional TR), bioprosthetic aortic valve s/p TAVR with no evidence for thrombus/pannus, trivial perivalvular leakage, and mean gradient down to 13 mmHg.  RHC 11/24 showed elevated right-sided filling pressures with mild pulmonary hypertension and preserved PAPi. Torsemide  was increased to 40 qam/20 qpm.   Patient self-discontinued Farxiga  given development of joint pain/ muscle aches.   Consult with Dr. Konrad Persia at [facility] 09/2023, potential candidate for [facility]. Planning CTA, TEE and RHC for further work up.  AHF clinic 4/22: Volume overloaded. Given 3 days of furoscix . Seen again in AHF clinic 4/30 with persistent volume overload but good response to furoscix . Given 3 more days.   Followed by Dr. Konrad Persia at [facility] for evaluation for percutaneous tricuspid valve repair. TEE 5/8 with severe TR. Will follow up to decide on ultimate plan. They were also discussing Watchman per  his report.   Given Furoscix  x6. Last visit he was given 120 mg IV lasix  5/13 and 5/14 in the outpatient clinic.   Today he returns for HF follow up.Overall feeling fine. Denies SOB/PND/Orthopnea. Appetite ok. No fever or chills. Weight at home  pounds. Taking all medications  PMH: 1. HTN 2. Hyperlipidemia 3. Aortic stenosis: Severe. S/p TAVR in 9/23 with 29 mm Edwards Sapien 3 THV.  - Echo (10/23): EF 60-65%, normal RV, trivial MR, TAVR valve with no PVL and mean gradient 15 mmHg.  - Echo (9/24): EF 60-65%, mild LVH, D-shaped interventricular septum with severe RV enlargement, mild RV dysfunction, PASP 50 mmHg, TAVR valve with mean gradient 18 mmHg, mild MR, severe TR, dilated IVC.  - TEE (11/24): EF 55-60%, mild LVH, mildly D-shaped septum with severe RV enlargement but normal RV systolic function, severe biatrial enlargement, severe TR (suspect atrial functional TR), bioprosthetic aortic valve s/p TAVR with no evidence for thrombus/pannus, trivial perivalvular leakage, and mean gradient down to 13 mmHg.  4. Tricuspid regurgitation: Severe.  Noted first on 9/24 echo.  TEE (11/24): EF 55-60%, mild LVH, mildly D-shaped septum with severe RV enlargement but normal RV systolic function, severe biatrial enlargement, severe TR (suspect atrial functional TR), bioprosthetic aortic valve s/p TAVR with no evidence for thrombus/pannus, trivial perivalvular leakage, and mean gradient down to 13 mmHg.  - RHC (11/24): Mean RA 10 with v-waves to 19; PA 42/14, mean 28; mean PCWP 10, CI 4.74, PVR 1.7 WU, PAPi 2.8.  5. LHC (8/23) with minimal CAD (pre-TAVR).  6. 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   Family History  Problem Relation Age of Onset   Colon cancer Mother    Alzheimer's disease Father    CVA Father    Diabetes Brother    ROS: All systems reviewed and negative except as per HPI.   Current Outpatient Medications   Medication Sig Dispense Refill   amoxicillin  (AMOXIL ) 500 MG tablet Take 4 tablets (2,000 mg total) by mouth as directed. 1 HOUR PRIOR TO DENTAL APPOINTMENTS 12 tablet 6   apixaban  (ELIQUIS ) 5 MG TABS tablet Take 1 tablet (5 mg total) by mouth 2 (two) times daily. 60 tablet 11   buprenorphine -naloxone  (SUBOXONE ) 8-2 mg SUBL SL tablet Place 2 tablets under the tongue daily.     clonazePAM  (KLONOPIN ) 0.5 MG tablet Take 0.5 mg by mouth 3 (three) times daily as needed for anxiety.     Collagen Hydrolysate POWD Take 3 applicators by mouth 2 (two) times daily.     Digestive Enzyme CAPS Take 1 capsule by mouth 2 (two) times daily.     metolazone  (ZAROXOLYN ) 2.5 MG tablet Take 1 tablet (2.5 mg total) by mouth daily. 4 tablet 0   Multiple Vitamin (MULTIVITAMIN WITH MINERALS) TABS tablet Take 3 tablets by mouth daily. Total minerals     potassium chloride  SA (KLOR-CON  M) 20 MEQ tablet Take 2 tablets (40 mEq total) by mouth daily. 180 tablet 3   Probiotic Product (PROBIOTIC PO) Take 1 capsule by mouth 2 (two) times daily. total flora     rosuvastatin  (CRESTOR ) 10 MG tablet Take 1 tablet (10 mg total) by mouth daily. 30 tablet 6   spironolactone  (ALDACTONE ) 25 MG tablet Take 0.5 tablets (12.5 mg total) by mouth daily. 45 tablet 3   testosterone  cypionate (DEPOTESTOSTERONE CYPIONATE) 200 MG/ML injection Inject 300 mg into the muscle every 28 (twenty-eight) days.     torsemide  (DEMADEX ) 20 MG tablet Take 3 tablets (60 mg total) by mouth 2 (two) times daily. 200 tablet 3   No current facility-administered medications for this visit.   Wt Readings from Last 3 Encounters:  10/31/23 89.8 kg (198 lb)  10/30/23 90.3 kg (199 lb)  10/17/23 89.8 kg (198 lb)   There were no vitals taken for this visit.  Physical Exam: General:  well appearing.  No respiratory difficulty. Walked into clinic Neck: supple. JVD to ear.  Cor: PMI nondisplaced. Regular rate & rhythm. No rubs, gallops or murmurs. Lungs:  clear Extremities: no cyanosis, clubbing, rash, +2-3 edema to upper thigh  Neuro: alert & oriented x 3. Moves all 4 extremities w/o difficulty. Affect pleasant.   Assessment/Plan: 1. 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  This is a new finding on the 9/24 echo compared to 10/23 echo.  Echo in 9/24 showed EF 60-65%, mild LVH, D-shaped interventricular septum with severe RV enlargement, mild RV dysfunction, PASP 50 mmHg, TAVR valve with mean gradient 18 mmHg, mild MR, severe TR, dilated IVC. TEE in 11/24 showed EF 55-60%, mild LVH, mildly D-shaped septum with severe RV enlargement but normal RV systolic function, severe biatrial enlargement, severe TR (suspect atrial functional TR), bioprosthetic aortic valve s/p TAVR with no evidence for thrombus/pannus, trivial perivalvular leakage, and mean gradient down to 13 mmHg.   RHC 11/24: signs of RV failure with elevated RA pressure though PAPi was preserved at 2.8.   - TTE 5/25: EF  65%. RV mod dilated. Severe TR. Mild MR.  Initially responded well to Furoscix , has had 6 doses at this point.  -NYHA  lan to increase Torsemide  to 60 mg BID with KDUR 40 mEq KDUR.  - Want to avoid over diuresis with RV failure but still markedly volume overloaded. Instructed to avoid salt.  - Continue spironolactone  12.5 mg daily.   - Will likely need weekly metolazone  going forward, but will hold off until next visit to re-assess. - He was unable to tolerate Farxiga  due to side effects.   - Will place order for UNNA boots  2. Permanent Atrial Fibrillation: rate controlled, HR 60s.  - On Eliquis , no bleeding issues. - EKG stable 4/25  3. Aortic Stenosis s/p TAVR:  stable on last TEE 11/24, mean gradient 13 mmHg.  - Stable on TEE 5/8   4. Hypertension: BP controlled.  - Meds as above.  5. Tricuspid regurgitation: This was severe on echo in 9/24 with dilated RV and D-shaped  interventricular septum.  TEE in 11/24 showed severe TR, possibly atrial functional TR with dilated right atrium. The RV on 11/24 TEE is severely dilated though overall systolic function appears normal.  - He has been Dr. Konrad Persia at [facility] for evaluation for percutaneous tricuspid valve repair. TEE 5/8 with severe TR. Will follow up to decide on ultimate plan. They were also discussing Watchman per his report.   6. 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 LDL goal < 55. - Continue Crestor  10 mg daily. Lipids/LFTs in 2 months.  Emmilyn Crooke  NP-C  11/07/2023

## 2023-11-19 ENCOUNTER — Ambulatory Visit (HOSPITAL_COMMUNITY)
Admission: RE | Admit: 2023-11-19 | Discharge: 2023-11-19 | Disposition: A | Discharge status: Home / Self Care | Source: Ambulatory Visit | Attending: Family Medicine | Admitting: Family Medicine

## 2023-11-19 ENCOUNTER — Telehealth (HOSPITAL_COMMUNITY): Payer: Self-pay

## 2023-11-19 ENCOUNTER — Encounter (HOSPITAL_COMMUNITY): Payer: Self-pay

## 2023-11-19 VITALS — BP 118/60 | HR 62 | Wt 208.0 lb

## 2023-11-19 DIAGNOSIS — I10 Essential (primary) hypertension: Secondary | ICD-10-CM | POA: Diagnosis not present

## 2023-11-19 DIAGNOSIS — Z952 Presence of prosthetic heart valve: Secondary | ICD-10-CM | POA: Diagnosis not present

## 2023-11-19 DIAGNOSIS — R931 Abnormal findings on diagnostic imaging of heart and coronary circulation: Secondary | ICD-10-CM

## 2023-11-19 DIAGNOSIS — I071 Rheumatic tricuspid insufficiency: Secondary | ICD-10-CM

## 2023-11-19 DIAGNOSIS — Z79899 Other long term (current) drug therapy: Secondary | ICD-10-CM | POA: Diagnosis not present

## 2023-11-19 DIAGNOSIS — I082 Rheumatic disorders of both aortic and tricuspid valves: Secondary | ICD-10-CM | POA: Diagnosis present

## 2023-11-19 DIAGNOSIS — I4821 Permanent atrial fibrillation: Secondary | ICD-10-CM

## 2023-11-19 DIAGNOSIS — I5033 Acute on chronic diastolic (congestive) heart failure: Secondary | ICD-10-CM

## 2023-11-19 DIAGNOSIS — I110 Hypertensive heart disease with heart failure: Secondary | ICD-10-CM | POA: Insufficient documentation

## 2023-11-19 DIAGNOSIS — Z7901 Long term (current) use of anticoagulants: Secondary | ICD-10-CM | POA: Insufficient documentation

## 2023-11-19 DIAGNOSIS — I5042 Chronic combined systolic (congestive) and diastolic (congestive) heart failure: Secondary | ICD-10-CM

## 2023-11-19 LAB — BASIC METABOLIC PANEL WITH GFR
Anion gap: 10 (ref 5–15)
BUN: 25 mg/dL — ABNORMAL HIGH (ref 8–23)
CO2: 35 mmol/L — ABNORMAL HIGH (ref 22–32)
Calcium: 7.9 mg/dL — ABNORMAL LOW (ref 8.9–10.3)
Chloride: 95 mmol/L — ABNORMAL LOW (ref 98–111)
Creatinine, Ser: 1.09 mg/dL (ref 0.61–1.24)
GFR, Estimated: >60 mL/min (ref >60)
Glucose, Bld: 95 mg/dL (ref 70–99)
Potassium: 3.4 mmol/L — ABNORMAL LOW (ref 3.5–5.1)
Sodium: 140 mmol/L (ref 135–145)

## 2023-11-19 LAB — MAGNESIUM: Magnesium: 2.3 mg/dL (ref 1.7–2.4)

## 2023-11-19 LAB — BRAIN NATRIURETIC PEPTIDE: B Natriuretic Peptide: 592.9 pg/mL — ABNORMAL HIGH (ref 0.0–100.0)

## 2023-11-19 MED ORDER — METOLAZONE 2.5 MG PO TABS: 2.5000 mg | ORAL_TABLET | ORAL | 0 refills | Status: DC

## 2023-11-19 NOTE — Progress Notes (Signed)
 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 In 9/23, patient had TAVR for severe AS with 29 mm Edwards Sapien 3 THV.  Prior to TAVR, he had LHC in 8/23 showing minimal CAD.  Initial post-TAVR echo in 10/23 showed EF 60-65%, normal RV, trivial MR, TAVR valve with no PVL and mean gradient 15 mmHg.  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 Of note, patient was not taking Eliquis  at at time of 9/24 echo.  He had been concerned about bruising.  He started taking apixaban  in 10/24.   TEE 11/24: EF 55-60%, mild LVH, mildly D-shaped septum with severe RV enlargement but normal RV systolic function, severe biatrial enlargement, severe TR (suspect atrial functional TR), bioprosthetic aortic valve s/p TAVR with no evidence for thrombus/pannus, trivial perivalvular leakage, and mean gradient down to 13 mmHg.  RHC 11/24 showed elevated right-sided filling pressures with mild pulmonary hypertension and preserved PAPi. Torsemide  was increased to 40 qam/20 qpm.   Patient self-discontinued Farxiga  given development of joint pain/ muscle aches.   Consult with Dr. Konrad Persia at [facility] 09/2023, potential candidate for [facility]. Planning CTA, TEE and RHC for further work up.  Received Furoscix  x 3 doses at follow up 10/09/23. Seen again 10/17/23, received 3 more doses of Furoscix .    Followed by Dr. Konrad Persia at [facility] for evaluation for percutaneous tricuspid valve repair. TEE 10/25/23 with severe TR. Will follow up to decide on ultimate plan. They were also discussing Watchman per his report.   Given Furoscix  x 6 doses. Last visit he was  given 120 mg IV lasix  5/13 and 5/14 in the outpatient clinic.   Today he returns for HF follow up with his wife. Overall feeling fine. Breathing is OK, swelling is worse. Wife says he missed a day of meds, but fluid was down after IV lasix  last visit. Denies palpitations, abnormal bleeding, CP, dizziness, or PND/Orthopnea. Appetite ok. Weight at home 202 pounds. Taking all medications.   ReDs reading: 43%, abnormal  ECG (personally reviewed): none ordered today.  Labs (10/24): LDL 93, K 4.1, creatinine 1.1 => 1.28, hgb 13.7, BNP 485 Labs (12/24): K 4.3, creatinine 1.72 Labs (1/25): K 4.1, creatinine 1.33 Labs (2/25): K 3.9, creatinine 1.5  Labs (3/25): K 4.2, creatinine 1.27, BNP 511 Labs (4/25): K 4.1, creatinine 1.34 Labs (3/25): LDL 85 Labs (5/25): K 4.0, creatinine 1.50  PMH: 1. HTN 2. Hyperlipidemia 3. Aortic stenosis: Severe. S/p TAVR in 9/23 with 29 mm Edwards Sapien 3 THV.  - Echo (10/23): EF 60-65%, normal RV, trivial MR, TAVR valve with no PVL and mean gradient 15 mmHg.  - Echo (9/24): EF 60-65%, mild LVH, D-shaped interventricular septum with severe RV enlargement, mild RV dysfunction, PASP 50 mmHg, TAVR valve with mean gradient 18 mmHg, mild MR, severe TR, dilated IVC.  - TEE (11/24): EF 55-60%, mild LVH, mildly D-shaped septum with severe RV enlargement but normal RV systolic function, severe biatrial enlargement, severe TR (suspect atrial functional TR), bioprosthetic aortic valve s/p TAVR with no evidence for thrombus/pannus, trivial perivalvular leakage, and mean gradient down to 13 mmHg.  4. Tricuspid regurgitation:  Severe.  Noted first on 9/24 echo.  TEE (11/24): EF 55-60%, mild LVH, mildly D-shaped septum with severe RV enlargement but normal RV systolic function, severe biatrial enlargement, severe TR (suspect atrial functional TR), bioprosthetic aortic valve s/p TAVR with no evidence for thrombus/pannus, trivial perivalvular leakage, and mean gradient down to 13 mmHg.   - RHC (11/24): Mean RA 10 with v-waves to 19; PA 42/14, mean 28; mean PCWP 10, CI 4.74, PVR 1.7 WU, PAPi 2.8.  - TEE (5/25 at [facility]): LVEF 65%, RV moderately reduced, well-seated TAVR valve, mild MR, severe TR associated with annular dilation. 5. LHC (8/23) with minimal CAD (pre-TAVR).  6. 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   Family History  Problem Relation Age of Onset   Colon cancer Mother    Alzheimer's disease Father    CVA Father    Diabetes Brother    ROS: All systems reviewed and negative except as per HPI.   Current Outpatient Medications  Medication Sig Dispense Refill   amoxicillin  (AMOXIL ) 500 MG tablet Take 4 tablets (2,000 mg total) by mouth as directed. 1 HOUR PRIOR TO DENTAL APPOINTMENTS 12 tablet 6   apixaban  (ELIQUIS ) 5 MG TABS tablet Take 1 tablet (5 mg total) by mouth 2 (two) times daily. 60 tablet 11   buprenorphine -naloxone  (SUBOXONE ) 8-2 mg SUBL SL tablet Place 2 tablets under the tongue daily.     clonazePAM  (KLONOPIN ) 0.5 MG tablet Take 0.5 mg by mouth 3 (three) times daily as needed for anxiety.     Collagen Hydrolysate POWD Take 3 applicators by mouth 2 (two) times daily.     Digestive Enzyme CAPS Take 1 capsule by mouth 2 (two) times daily.     metolazone  (ZAROXOLYN ) 2.5 MG tablet Take 1 tablet (2.5 mg total) by mouth daily. 4 tablet 0   Multiple Vitamin (MULTIVITAMIN WITH MINERALS) TABS tablet Take 3 tablets by mouth daily. Total minerals     potassium chloride  SA (KLOR-CON  M) 20 MEQ tablet Take 2 tablets (40 mEq total) by mouth daily. 180 tablet 3   Probiotic Product (PROBIOTIC PO) Take 1 capsule by mouth 2 (two) times daily. total flora     rosuvastatin  (CRESTOR ) 10 MG tablet Take 1 tablet (10 mg total) by mouth daily. 30 tablet 6   spironolactone  (ALDACTONE ) 25 MG tablet Take 0.5 tablets (12.5 mg total) by mouth daily. 45 tablet 3   testosterone  cypionate (DEPOTESTOSTERONE  CYPIONATE) 200 MG/ML injection Inject 300 mg into the muscle every 28 (twenty-eight) days.     torsemide  (DEMADEX ) 20 MG tablet Take 3 tablets (60 mg total) by mouth 2 (two) times daily. 200 tablet 3   No current facility-administered medications for this encounter.   Wt Readings from Last 3 Encounters:  11/19/23 94.3 kg (208 lb)  10/31/23 89.8 kg (198 lb)  10/30/23 90.3 kg (199 lb)   BP 118/60   Pulse 62   Wt 94.3 kg (208 lb)   SpO2 96%   BMI 26.71 kg/m   Physical Exam: General:  NAD. No resp difficulty, walked into clinic HEENT: Normal Neck: Supple. JVP 12-14+ Cor: Irregular rate & rhythm. No rubs, gallops, 3/6 SEM LLSB Lungs: Clear Abdomen: Soft, nontender, nondistended.  Extremities: No cyanosis, clubbing, rash, 3+ BLE edema to thighs Neuro: Alert & oriented x 3, moves all 4 extremities w/o difficulty. Affect pleasant.  Assessment/Plan: 1. 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  This is a new finding on the 9/24 echo compared to 10/23 echo.  Echo in 9/24 showed EF 60-65%, mild LVH, D-shaped interventricular septum with severe RV enlargement, mild RV dysfunction, PASP 50 mmHg, TAVR valve with mean gradient 18 mmHg, mild MR, severe TR, dilated IVC. TEE in 11/24 showed EF 55-60%, mild LVH, mildly D-shaped septum with severe RV enlargement but normal RV systolic function, severe biatrial enlargement, severe TR (suspect atrial functional TR), bioprosthetic aortic valve s/p TAVR with no evidence for thrombus/pannus, trivial perivalvular leakage, and mean gradient down to 13 mmHg.   RHC 11/24: signs of RV failure with elevated RA pressure though PAPi was preserved at 2.8.  TEE 5/25 at [facility]: EF 65%, RV mod dilated, severe TR, mild MR. Initially responded well to Furoscix , has had 6 doses at this point. However, he no-showed to 1 week follow up and missed a dose of diuretics and is now markedly volume  overloaded with NYHA IIIb symptoms. ReDs 42%. - Want to avoid over diuresis with RV failure but still markedly volume overloaded.I  recommended hospitalization for IV diuresis & close monitoring of electrolytes, but he and his wife decline.  - Instructed to use Furoscix  + 40 KCL bid + metolazone  2.5 mg daily q am and torsemide  60 mg q pm x 3 days - After 3 days, resume torsemide  60 mg bid - Continue spironolactone  12.5 mg daily.   - Will likely need weekly metolazone  going forward, but will hold off until close follow up next week to re-address. - He was unable to tolerate Farxiga  due to side effects.   - Continue compression hose. Have been unsuccessful in arranging UNNA boots. 2. Permanent Atrial Fibrillation: rate controlled, HR 60s.  - On Eliquis , no bleeding issues. 3. Aortic Stenosis s/p TAVR:  stable on TEE 11/24, mean gradient 13 mmHg.  - Stable on TEE 10/25/23. 4. Hypertension: BP controlled.  - Meds as above. 5. Tricuspid regurgitation: This was severe on echo in 9/24 with dilated RV and D-shaped interventricular septum.  TEE in 11/24 showed severe TR, possibly atrial functional TR with dilated right atrium. The RV on 11/24 TEE is severely dilated though overall systolic function appears normal. TEE 10/25/23 with severe TR.  - He has been Dr. Konrad Persia at [facility] for evaluation for percutaneous tricuspid valve repair.  - Planning tricuspid repair 12/19/23. 6. 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 LDL goal < 55. - Continue Crestor  10 mg daily. Lipids/LFTs in 2 months (~12/2023).  Follow up in 1 week with APP. He is high risk for re-admission.  Arlice Bene [facility] FNP-BC  11/19/2023

## 2023-11-19 NOTE — Telephone Encounter (Signed)
 Patient called stating that he has LE edema that has gradually got worse since Friday. He states that despite taking medication as prescribed nothing has helped. Patient scheduled for this afternoon at 2:30pm

## 2023-11-19 NOTE — Progress Notes (Signed)
 ReDS Vest / Clip - 11/19/23 1500       ReDS Vest / Clip   Station Marker C    Ruler Value 27.5    ReDS Value Range High volume overload    ReDS Actual Value 43

## 2023-11-19 NOTE — Patient Instructions (Addendum)
 Thank you for coming in today  If you had labs drawn today, any labs that are abnormal the clinic will call you No news is good news  Medications: you were given 2 infusers in the clinic today Your provider has order Furoscix  for you. This is an on-body infuser that gives you a dose of Furosemide .   It will be shipped to your home   Furoscix  Direct will call you to discuss before shipping so, PLEASE answer unknown calls  For questions regarding the device call Furoscix  Direct at [phone]  Ensure you write down the time you start your infusion so that if there is a problem you will know how long the infusion lasted  Use Furoscix  only AS DIRECTED by our office  Dosing Directions:   Day 1= Tuesday 11/20/2023                    morning: Furoscix  with Potassium 80 meq with Metolazone  2.5 mg         Evening: Torsemide  60 mg  Day 2= Wednesday 11/21/2023        morning: Furoscix  with Potassium 80 meq with Metolazone  2.5 mg         Evening: Torsemide  60 mg  Day 3= Thursday 11/22/2023                    morning: Furoscix  with Potassium 80 meq with Metolazone  2.5 mg         Evening: Torsemide  60 mg        Day 4= Friday 11/23/2023 Torsemide  60 mg twice daily  Follow up appointments:  Your physician recommends that you schedule a follow-up appointment in:  1 week in clinic   Do the following things EVERYDAY: Weigh yourself in the morning before breakfast. Write it down and keep it in a log. Take your medicines as prescribed Eat low salt foods--Limit salt (sodium) to 2000 mg per day.  Stay as active as you can everyday Limit all fluids for the day to less than 2 liters   At the [facility], you and your health needs are our priority. As part of our continuing mission to provide you with exceptional heart care, we have created designated Provider Care Teams. These Care Teams include your primary Cardiologist (physician) and Advanced Practice Providers (APPs-  Physician Assistants and Nurse Practitioners) who all work together to provide you with the care you need, when you need it.   You may see any of the following providers on your designated Care Team at your next follow up: Dr Jules Oar Dr Peder Bourdon Dr. Mimi Alt, NP Ruddy Corral, [place] [facility] [place], [place] Dennise Fitz, NP Luster Salters, PharmD   Please be sure to bring in all your medications bottles to every appointment.    Thank you for choosing [facility]  If you have any questions or concerns before your next appointment please send us  a message through May or call our office at [phone].    TO LEAVE A MESSAGE FOR THE NURSE SELECT OPTION 2, PLEASE LEAVE A MESSAGE INCLUDING: YOUR NAME DATE OF BIRTH CALL BACK NUMBER REASON FOR CALL**this is important as we prioritize the call backs  YOU WILL RECEIVE A CALL BACK THE SAME DAY AS LONG AS YOU CALL BEFORE 4:00 PM

## 2023-11-20 ENCOUNTER — Ambulatory Visit (HOSPITAL_COMMUNITY): Payer: Self-pay | Admitting: Family Medicine

## 2023-11-20 DIAGNOSIS — I5033 Acute on chronic diastolic (congestive) heart failure: Secondary | ICD-10-CM

## 2023-11-21 MED ORDER — SPIRONOLACTONE 25 MG PO TABS: 25.0000 mg | ORAL_TABLET | Freq: Every day | ORAL | 3 refills | Status: DC

## 2023-11-21 NOTE — Telephone Encounter (Signed)
Pt aware.

## 2023-11-21 NOTE — Telephone Encounter (Signed)
-----   Message from Elmarie Hacking sent at 11/20/2023  8:17 AM EDT ----- K is low. Increase spiro to 1 tab (25 mg daily). He has follow up next week and we will check labs then

## 2023-11-21 NOTE — Telephone Encounter (Signed)
-----   Message from Elmarie Hacking sent at 11/20/2023  8:17 AM EDT ----- Devin Buckley is low. Increase spiro to 1 tab (25 mg daily). He has follow up next week and we will check labs then

## 2023-11-21 NOTE — Telephone Encounter (Signed)
 PT AWARE

## 2023-11-22 ENCOUNTER — Telehealth (HOSPITAL_COMMUNITY): Payer: Self-pay | Admitting: Cardiology

## 2023-11-22 NOTE — Telephone Encounter (Signed)
 Patient called to report issue with furoscix  infuser Reports he is wearing day 2 of 3 infuser  Reports he was wearing for 2 hours then infuser blinked red and stopped infusing  Advised to contact Furoscix  direct to calculate how much he has received based on start time  Please advise if pt should replace infuser for today or continue with current plan and use 3rd infuser 6/6

## 2023-11-22 NOTE — Telephone Encounter (Signed)
 No answer

## 2023-11-23 ENCOUNTER — Telehealth (HOSPITAL_COMMUNITY): Payer: Self-pay

## 2023-11-23 NOTE — Telephone Encounter (Signed)
 Called to confirm/remind patient of their appointment at the [facility] on 11/26/2023 11:30.   Appointment:   [] Confirmed  [x] Left mess   [] No answer/No voice mail  [] VM Full/unable to leave message  [] Phone not in service  Patient reminded to bring all medications and/or complete list.  Confirmed patient has transportation. Gave directions, instructed to utilize valet parking.

## 2023-11-26 ENCOUNTER — Ambulatory Visit (HOSPITAL_COMMUNITY)
Admission: RE | Admit: 2023-11-26 | Discharge: 2023-11-26 | Disposition: A | Discharge status: Home / Self Care | Source: Ambulatory Visit | Attending: Adult Health | Admitting: Adult Health

## 2023-11-26 ENCOUNTER — Telehealth (HOSPITAL_COMMUNITY): Payer: Self-pay

## 2023-11-26 VITALS — BP 132/60 | HR 74 | Wt 194.4 lb

## 2023-11-26 DIAGNOSIS — Z79899 Other long term (current) drug therapy: Secondary | ICD-10-CM | POA: Insufficient documentation

## 2023-11-26 DIAGNOSIS — I5042 Chronic combined systolic (congestive) and diastolic (congestive) heart failure: Secondary | ICD-10-CM

## 2023-11-26 DIAGNOSIS — I110 Hypertensive heart disease with heart failure: Secondary | ICD-10-CM | POA: Diagnosis not present

## 2023-11-26 DIAGNOSIS — Z7901 Long term (current) use of anticoagulants: Secondary | ICD-10-CM | POA: Diagnosis not present

## 2023-11-26 DIAGNOSIS — I4821 Permanent atrial fibrillation: Secondary | ICD-10-CM | POA: Diagnosis not present

## 2023-11-26 DIAGNOSIS — I5032 Chronic diastolic (congestive) heart failure: Secondary | ICD-10-CM

## 2023-11-26 DIAGNOSIS — I071 Rheumatic tricuspid insufficiency: Secondary | ICD-10-CM

## 2023-11-26 DIAGNOSIS — I082 Rheumatic disorders of both aortic and tricuspid valves: Secondary | ICD-10-CM | POA: Diagnosis not present

## 2023-11-26 DIAGNOSIS — I5033 Acute on chronic diastolic (congestive) heart failure: Secondary | ICD-10-CM | POA: Insufficient documentation

## 2023-11-26 DIAGNOSIS — I2510 Atherosclerotic heart disease of native coronary artery without angina pectoris: Secondary | ICD-10-CM | POA: Diagnosis not present

## 2023-11-26 LAB — BASIC METABOLIC PANEL WITH GFR
Anion gap: 7 (ref 5–15)
BUN: 42 mg/dL — ABNORMAL HIGH (ref 8–23)
CO2: 41 mmol/L — ABNORMAL HIGH (ref 22–32)
Calcium: 7.9 mg/dL — ABNORMAL LOW (ref 8.9–10.3)
Chloride: 87 mmol/L — ABNORMAL LOW (ref 98–111)
Creatinine, Ser: 1.28 mg/dL — ABNORMAL HIGH (ref 0.61–1.24)
GFR, Estimated: 56 mL/min — ABNORMAL LOW (ref >60)
Glucose, Bld: 134 mg/dL — ABNORMAL HIGH (ref 70–99)
Potassium: 2.7 mmol/L — CL (ref 3.5–5.1)
Sodium: 135 mmol/L (ref 135–145)

## 2023-11-26 MED ORDER — TORSEMIDE 20 MG PO TABS: 40.0000 mg | ORAL_TABLET | Freq: Two times a day (BID) | ORAL | Status: DC

## 2023-11-26 MED ORDER — POTASSIUM CHLORIDE CRYS ER 20 MEQ PO TBCR: 40.0000 meq | EXTENDED_RELEASE_TABLET | Freq: Every day | ORAL | 3 refills | Status: DC

## 2023-11-26 NOTE — Progress Notes (Signed)
 ReDS Vest / Clip - 11/26/23 1100       ReDS Vest / Clip   Station Marker C    Ruler Value 26.5    ReDS Value Range Moderate volume overload    ReDS Actual Value 36

## 2023-11-26 NOTE — Patient Instructions (Signed)
 CHANGE Lasix  to 40 mg Twice daily   HOLD YOUR EVENING DOSE OF TORSEMIDE  UNTIL WE CALL YOU WITH YOUR LAB RESULTS.  Labs done today, your results will be available in MyChart, we will contact you for abnormal readings.  Your physician recommends that you schedule a follow-up appointment in: 6 WEEKS.   If you have any questions or concerns before your next appointment please send us  a message through [employer] or call our office at [phone].    TO LEAVE A MESSAGE FOR THE NURSE SELECT OPTION 2, PLEASE LEAVE A MESSAGE INCLUDING: YOUR NAME DATE OF BIRTH CALL BACK NUMBER REASON FOR CALL**this is important as we prioritize the call backs  YOU WILL RECEIVE A CALL BACK THE SAME DAY AS LONG AS YOU CALL BEFORE 4:00 PM  At the [facility], you and your health needs are our priority. As part of our continuing mission to provide you with exceptional heart care, we have created designated Provider Care Teams. These Care Teams include your primary Cardiologist (physician) and Advanced Practice Providers (APPs- Physician Assistants and Nurse Practitioners) who all work together to provide you with the care you need, when you need it.   You may see any of the following providers on your designated Care Team at your next follow up: Dr Jules Oar Dr Peder Bourdon Dr. Alwin Baars Dr. Arta Lark Amy Marijane Shoulders, NP Ruddy Corral, [place] [facility], [place] Dennise Fitz, NP [place] Lee, NP Shawnee Dellen, NP Luster Salters, PharmD Bevely Brush, PharmD   Please be sure to bring in all your medications bottles to every appointment.    Thank you for choosing [facility]

## 2023-11-26 NOTE — Telephone Encounter (Signed)
 Spoke with both patient and his wife. Instructed him to hold his Torsemide  this evening and take an extra 2 potassium tablets. Starting tomorrow his is to take 40 mEq of potassium Twice daily. Repeat labs ordered and scheduled. Med list updated

## 2023-11-26 NOTE — Progress Notes (Signed)
 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 In 9/23, patient had TAVR for severe AS with 29 mm Edwards Sapien 3 THV.  Prior to TAVR, he had LHC in 8/23 showing minimal CAD.  Initial post-TAVR echo in 10/23 showed EF 60-65%, normal RV, trivial MR, TAVR valve with no PVL and mean gradient 15 mmHg.  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 Of note, patient was not taking Eliquis  at at time of 9/24 echo.  He had been concerned about bruising.  He started taking apixaban  in 10/24.   TEE 11/24: EF 55-60%, mild LVH, mildly D-shaped septum with severe RV enlargement but normal RV systolic function, severe biatrial enlargement, severe TR (suspect atrial functional TR), bioprosthetic aortic valve s/p TAVR with no evidence for thrombus/pannus, trivial perivalvular leakage, and mean gradient down to 13 mmHg.  RHC 11/24 showed elevated right-sided filling pressures with mild pulmonary hypertension and preserved PAPi. Torsemide  was increased to 40 qam/20 qpm.   Patient self-discontinued Farxiga  given development of joint pain/ muscle aches.   Consult with Dr. Konrad Persia at [facility] 09/2023, potential candidate for [facility]. Planning CTA, TEE and RHC for further work up.  AHF clinic 4/22: Volume overloaded. Given 3 days of furoscix . Seen again in AHF clinic 4/30 with persistent volume overload but good response to furoscix . Given 3 more days.   Followed by Dr. Konrad Persia at [facility] for evaluation for percutaneous tricuspid valve repair. TEE 5/8 with severe TR. Will follow up to decide on ultimate plan. They were also discussing Watchman per  his report.   Given Furoscix  x6. Last visit he was given 120 mg IV lasix  5/13 and 5/14 in the outpatient clinic.   Today he returns for HF follow up with his wife. Overall feeling  a washed out. Gets a little SOB with exertion. Denies PND/Orthopnea. Lower extremity extremity edema improved and only in the only Appetite ok. No fever or chills. Weight at home 194 pounds. Taking all medications but he has only be taking Torsemide  40 mg in am and 20 mg in pm.   He has been set up for Tri Clip procedure 12/19/23.  PMH: 1. HTN 2. Hyperlipidemia 3. Aortic stenosis: Severe. S/p TAVR in 9/23 with 29 mm Edwards Sapien 3 THV.  - Echo (10/23): EF 60-65%, normal RV, trivial MR, TAVR valve with no PVL and mean gradient 15 mmHg.  - Echo (9/24): EF 60-65%, mild LVH, D-shaped interventricular septum with severe RV enlargement, mild RV dysfunction, PASP 50 mmHg, TAVR valve with mean gradient 18 mmHg, mild MR, severe TR, dilated IVC.  - TEE (11/24): EF 55-60%, mild LVH, mildly D-shaped septum with severe RV enlargement but normal RV systolic function, severe biatrial enlargement, severe TR (suspect atrial functional TR), bioprosthetic aortic valve s/p TAVR with no evidence for thrombus/pannus, trivial perivalvular leakage, and mean gradient down to 13 mmHg.  4. Tricuspid regurgitation: Severe.  Noted first on 9/24 echo.  TEE (11/24): EF 55-60%, mild LVH, mildly D-shaped septum with severe RV enlargement but normal RV systolic function, severe biatrial enlargement, severe TR (suspect atrial functional TR), bioprosthetic aortic valve s/p TAVR with  no evidence for thrombus/pannus, trivial perivalvular leakage, and mean gradient down to 13 mmHg.  - RHC (11/24): Mean RA 10 with v-waves to 19; PA 42/14, mean 28; mean PCWP 10, CI 4.74, PVR 1.7 WU, PAPi 2.8.  5. LHC (8/23) with minimal CAD (pre-TAVR).  6. 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    Family History  Problem Relation Age of Onset   Colon cancer Mother    Alzheimer's disease Father    CVA Father    Diabetes Brother    ROS: All systems reviewed and negative except as per HPI.   Current Outpatient Medications  Medication Sig Dispense Refill   amoxicillin  (AMOXIL ) 500 MG tablet Take 4 tablets (2,000 mg total) by mouth as directed. 1 HOUR PRIOR TO DENTAL APPOINTMENTS 12 tablet 6   apixaban  (ELIQUIS ) 5 MG TABS tablet Take 1 tablet (5 mg total) by mouth 2 (two) times daily. 60 tablet 11   buprenorphine -naloxone  (SUBOXONE ) 8-2 mg SUBL SL tablet Place 2 tablets under the tongue daily.     clonazePAM  (KLONOPIN ) 0.5 MG tablet Take 0.5 mg by mouth 3 (three) times daily as needed for anxiety.     Collagen Hydrolysate POWD Take 3 applicators by mouth 2 (two) times daily.     Digestive Enzyme CAPS Take 1 capsule by mouth 2 (two) times daily.     metolazone  (ZAROXOLYN ) 2.5 MG tablet Take 1 tablet (2.5 mg total) by mouth as directed. (Patient taking differently: Take 2.5 mg by mouth as directed. Patient takes 2 tablets by mouth daily.) 4 tablet 0   Multiple Vitamin (MULTIVITAMIN WITH MINERALS) TABS tablet Take 3 tablets by mouth daily. Total minerals     potassium chloride  SA (KLOR-CON  M) 20 MEQ tablet Take 2 tablets (40 mEq total) by mouth daily. 180 tablet 3   Probiotic Product (PROBIOTIC PO) Take 1 capsule by mouth 2 (two) times daily. total flora     rosuvastatin  (CRESTOR ) 10 MG tablet Take 1 tablet (10 mg total) by mouth daily. 30 tablet 6   spironolactone  (ALDACTONE ) 25 MG tablet Take 1 tablet (25 mg total) by mouth daily. 45 tablet 3   testosterone  cypionate (DEPOTESTOSTERONE CYPIONATE) 200 MG/ML injection Inject 300 mg into the muscle every 28 (twenty-eight) days.     torsemide  (DEMADEX ) 20 MG tablet Take 3 tablets (60 mg total) by mouth 2 (two) times daily. (Patient taking differently: Take 20 mg by mouth 2 (two) times daily. He is taking 40 mg in am and 20 mg in pm) 200  tablet 3   No current facility-administered medications for this encounter.   Wt Readings from Last 3 Encounters:  11/26/23 88.2 kg (194 lb 6.4 oz)  11/19/23 94.3 kg (208 lb)  10/31/23 89.8 kg (198 lb)   BP 132/60   Pulse 74   Wt 88.2 kg (194 lb 6.4 oz)   SpO2 97%   BMI 24.96 kg/m   Physical Exam: General:   No resp difficulty Neck: supple. JVP 9-10  Cor: PMI nondisplaced. Irregular rate & rhythm. No rubs, gallops or murmurs. Lungs: clear Abdomen: soft, nontender, nondistended.  Extremities: no cyanosis, clubbing, rash, below the knees R and LLE 1-2+ edema Neuro: alert & oriented x3    Assessment/Plan: 1. 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  This is a new finding on the 9/24 echo compared to 10/23 echo.  Echo in  9/24 showed EF 60-65%, mild LVH, D-shaped interventricular septum with severe RV enlargement, mild RV dysfunction, PASP 50 mmHg, TAVR valve with mean gradient 18 mmHg, mild MR, severe TR, dilated IVC. TEE in 11/24 showed EF 55-60%, mild LVH, mildly D-shaped septum with severe RV enlargement but normal RV systolic function, severe biatrial enlargement, severe TR (suspect atrial functional TR), bioprosthetic aortic valve s/p TAVR with no evidence for thrombus/pannus, trivial perivalvular leakage, and mean gradient down to 13 mmHg.   RHC 11/24: signs of RV failure with elevated RA pressure though PAPi was preserved at 2.8.   - TTE 5/25: EF 65%. RV mod dilated. Severe TR. Mild MR.  Initially responded well to Furoscix , has had 6 doses at this point.  -NYHA III. ReDs reading: 36 %, normal - Will increase Torsemide  40 mg twice a day.  Want to avoid over diuresis with RV failure . - Continue spironolactone  12.5 mg daily.   -He was unable to tolerate Farxiga  due to side effects.   Check BMET   2. Permanent Atrial Fibrillation: Rate controlled.  - On Eliquis , no bleeding issues.  3. Aortic  Stenosis s/p TAVR:  stable on last TEE 11/24, mean gradient 13 mmHg.  - Stable on TEE 5/8   4. Hypertension: BP controlled.  - Meds as above.  5. Tricuspid regurgitation: This was severe on echo in 9/24 with dilated RV and D-shaped interventricular septum.  TEE in 11/24 showed severe TR, possibly atrial functional TR with dilated right atrium. The RV on 11/24 TEE is severely dilated though overall systolic function appears normal.  - He has been Dr. Konrad Persia at [facility] for evaluation for percutaneous tricuspid valve repair. TEE 5/8 with severe TR.  Plan to Trip Clip 12/19/23. They were also discussing Watchman per his report.   6. 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 LDL goal < 55. - Continue Crestor  10 mg daily. Lipids/LFTs in 2 months.  Follow up with APP in 6 weeks and 3 months with Dr Mitzie Anda,.   Reanna Scoggin  NP-C  11/26/2023

## 2023-12-03 ENCOUNTER — Other Ambulatory Visit (HOSPITAL_COMMUNITY)

## 2023-12-03 NOTE — Procedures (Signed)
 Perfusion Procedure Note for CT surgery    Devin Buckley  12/03/2023    Most recent: Height: 188 cm (6' 2) Weight: 90.7 kg (199 lb 15.3 oz) BSA (Calculated - sq m): 2.18 sq meters  CPB strategy Full   Cerebral Oximetry Used Yes   Aortic Crossclamp type None, fibrillating heart   Total Aortic Crossclamp Time No crossclamp, fibrillation 65 min    Total Cardiopulmonary Bypass (CBP) time 119 min     Lowest blood temperature perfused  (deg Centigrade) 25 C  Cannulation Sites  Arterial   Femoral artery   Venous Femoral vein  Cardioplegia  Cardioplegia Type None     Cardioplegia Administration Methods  (check all used) None     Lowest Hematocrit 25.8  Route of temperature measurement Bladder   Blood Units transfused on pump 0 units   FreshFrozenPlasma transfused on pump 0 units      Cooling Time Prior to Circ Arrest 0 min  Cerebral Perfusion Type Not used   Circulatory Arrest time with Cerebral Perfusion time 0 min  Circulatory Arrest time without Cerebral Perfusion time 0 min  Total Circulatory Arrest time 0 min  Total Antegrade Selective Perfusion Time 0 min   ESTIMATED BLOOD LOSS:  Not applicable for cell saver case (minimal)  Perfusionist: Norleen Shoulders CCP

## 2023-12-04 NOTE — Telephone Encounter (Signed)
 Pt seen in office 6/9 Ok to close encounter

## 2023-12-17 ENCOUNTER — Other Ambulatory Visit (HOSPITAL_COMMUNITY)

## 2023-12-19 NOTE — Progress Notes (Signed)
 ADVANCED HF CLINIC NOTE    PCP: Cleotilde Oneil FALCON, MD HF Cardiology: Dr. 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 with history of permanent atrial fibrillation, aortic stenosis s/p TAVR, and CHF. In 9/23, patient had TAVR for severe AS with 29 mm Edwards Sapien 3 THV.  Prior to TAVR, he had LHC in 8/23 showing minimal CAD.  Initial post-TAVR echo in 10/23 showed EF 60-65%, normal RV, trivial MR, TAVR valve with no PVL and mean gradient 15 mmHg.  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 Of note, patient was not taking Eliquis  at at time of 9/24 echo.  He had been concerned about bruising.  He started taking apixaban  in 10/24.   TEE 11/24: EF 55-60%, mild LVH, mildly D-shaped septum with severe RV enlargement but normal RV systolic function, severe biatrial enlargement, severe TR (suspect atrial functional TR), bioprosthetic aortic valve s/p TAVR with no evidence for thrombus/pannus, trivial perivalvular leakage, and mean gradient down to 13 mmHg.  RHC 11/24 showed elevated right-sided filling pressures with mild pulmonary hypertension and preserved PAPi. Torsemide  was increased to 40 qam/20 qpm.   Over the past several months, he has had difficulty w/ ongoing fluid retention/LEE, in the setting of severe TR + in large part to poor compliance w/ diuretics. Has required treatment w/ Furoscix  and referrals to infusion clinic for IV Lasix . He failed SGLT2i. Patient self-discontinued Farxiga  given development of joint pain/ muscle aches.   Fortunately, he was able to get TR valve repair at [facility] 12/03/23, mini thoracotomy. Good result.  Pre-op TEE EF: 55%, mildly RV impaired, trace MR, severe TR. Post-op TEE EF: 70%, mild RV, trace TR. Also of note, pre-op diagnostic LHC showed severe 1-vessel coronary artery disease of the mid LAD, 80% stenosis. Plan was for PCI post TR valve repair however pt declined. Cited that he wanted to wait until he returned to [facility].   He was discharged from [facility] on Lasix  40 mg daily, amiodarone 200 mg daily, ASA 81, Eliquis  2.5 mg bid and Crestor  5 mg daily. Spironolactone  not on hospital AVS but wife says she thinks he is taking it.    He presents back today for f/u. Here w/ his wife. He c/w chest wall soreness but no ischemic CP. Says he feels he is getting better day by day. Denies resting dyspnea but c/w significant b/l LEE, R>L, 2+ pitting up to thighs. He feels this limits his mobility the most. NYHA Class II.   He reports full med compliance. He says he did not do well w/ Lasix  and decided to switch back to torsemide , currently taking 40 mg bid. He reports good UOP w/ this dose.   ECG (personally reviewed): none ordered today     Recent Cardiac Studies (done at [facility])   Studies: Prior cath: 11/30/23   Severe 1-vessel coronary artery disease of the mid LAD (80% stenosis)   Estimated blood loss: 0-50 cc.   TEE 12/03/23:  POST-PROCEDURE: --The patient is status post TV Repair. --Post bypass s/p tricuspid annuloplasty with trace residual tricuspid insufficiency. There is  borderline normal RV  funvtion and the LVEF is ~ 55%. --Otherwise, similar to baseline study.  Labs (10/24): LDL 93, K 4.1, creatinine 1.1 => 1.28, hgb 13.7, BNP 485 Labs (12/24): K 4.3, creatinine 1.72 Labs (1/25): K 4.1, creatinine 1.33 Labs (2/25): K 3.9, creatinine 1.5  Labs (3/25): K 4.2, creatinine 1.27, BNP 511 Labs (4/25): K 4.1, creatinine 1.34 Labs (3/25): LDL 85 Labs (5/25): K 4.0, creatinine 1.50   PMH: 1. HTN 2. Hyperlipidemia 3. Aortic stenosis: Severe. S/p TAVR  in 9/23 with 29 mm Edwards Sapien 3 THV.  - Echo (10/23): EF 60-65%, normal RV, trivial MR, TAVR valve with no PVL and mean gradient 15 mmHg.  - Echo (9/24): EF 60-65%, mild LVH, D-shaped interventricular septum with severe RV enlargement, mild RV dysfunction, PASP 50 mmHg, TAVR valve with mean gradient 18 mmHg, mild MR, severe TR, dilated IVC.  - TEE (11/24): EF 55-60%, mild LVH, mildly D-shaped septum with severe RV enlargement but normal RV systolic function, severe biatrial enlargement, severe TR (suspect atrial functional TR), bioprosthetic aortic valve s/p TAVR with no evidence for thrombus/pannus, trivial perivalvular leakage, and mean gradient down to 13 mmHg.  4. Tricuspid regurgitation: Severe.  Noted first on 9/24 echo.  TEE (11/24): EF 55-60%, mild LVH, mildly D-shaped septum with severe RV enlargement but normal RV systolic function, severe biatrial enlargement, severe TR (suspect atrial functional TR), bioprosthetic aortic valve s/p TAVR with no evidence for thrombus/pannus, trivial perivalvular leakage, and mean gradient down to 13 mmHg.  - RHC (11/24): Mean RA 10 with v-waves to 19; PA 42/14, mean 28; mean PCWP 10, CI 4.74, PVR 1.7 WU, PAPi 2.8.  - TEE (5/25 at [facility]): LVEF 65%, RV moderately reduced, well-seated TAVR valve, mild MR, severe TR associated with annular dilation. 5. LHC (8/23) with minimal CAD (pre-TAVR).  6. 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   Family History  Problem Relation Age of Onset   Colon cancer Mother    Alzheimer's disease Father    CVA Father    Diabetes Brother    ROS: All systems reviewed and negative except as per HPI.   Current Outpatient Medications  Medication Sig Dispense Refill   amoxicillin  (AMOXIL ) 500 MG tablet Take 4 tablets (2,000 mg total) by mouth as directed. 1 HOUR PRIOR TO DENTAL APPOINTMENTS 12 tablet 6   apixaban  (ELIQUIS ) 5 MG TABS tablet Take 1  tablet (5 mg total) by mouth 2 (two) times daily. 60 tablet 11   buprenorphine -naloxone  (SUBOXONE ) 8-2 mg SUBL SL tablet Place 2 tablets under the tongue daily.     clonazePAM  (KLONOPIN ) 0.5 MG tablet Take 0.5 mg by mouth 3 (three) times daily as needed for anxiety.     Collagen Hydrolysate POWD Take 3 applicators by mouth 2 (two) times daily.     Digestive Enzyme CAPS Take 1 capsule by mouth 2 (two) times daily.     Multiple Vitamin (MULTIVITAMIN WITH MINERALS) TABS tablet Take 3 tablets by mouth daily. Total minerals     potassium chloride  SA (KLOR-CON  M) 20 MEQ tablet Take 2 tablets (40 mEq total) by mouth daily. 180 tablet 3   Probiotic Product (PROBIOTIC PO) Take 1 capsule by mouth 2 (two) times daily. total flora     rosuvastatin  (CRESTOR ) 10 MG tablet Take 1 tablet (10 mg total) by mouth daily. 30 tablet 6   spironolactone  (ALDACTONE ) 25 MG tablet Take 1 tablet (25 mg total) by mouth daily. 45 tablet  3   testosterone  cypionate (DEPOTESTOSTERONE CYPIONATE) 200 MG/ML injection Inject 300 mg into the muscle every 28 (twenty-eight) days.     torsemide  (DEMADEX ) 20 MG tablet Take 2 tablets (40 mg total) by mouth 2 (two) times daily.     No current facility-administered medications for this encounter.   Wt Readings from Last 3 Encounters:  12/20/23 96.1 kg (211 lb 12.8 oz)  11/26/23 88.2 kg (194 lb 6.4 oz)  11/19/23 94.3 kg (208 lb)   BP 122/64   Pulse 77   Wt 96.1 kg (211 lb 12.8 oz)   SpO2 93%   BMI 27.19 kg/m   PHYSICAL EXAM: General:  Well appearing, elderly. No respiratory difficulty HEENT: normal Neck: supple. JVD 10-12 cm. Carotids 2+ bilat; no bruits. No lymphadenopathy or thyromegaly appreciated. Cor: PMI nondisplaced. Irregularly irregular rate and rhythm. No rubs, gallops or murmurs. Lungs: decreased BS at the bases  Abdomen: soft, nontender, nondistended. No hepatosplenomegaly. No bruits or masses. Good bowel sounds. Extremities: no cyanosis, clubbing, rash, 2+ b/l LEE  pitting edema up to thighs R>L  Neuro: alert & oriented x 3, cranial nerves grossly intact. moves all 4 extremities w/o difficulty. Affect pleasant.   Assessment/Plan: 1. 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  This is a new finding on the 9/24 echo compared to 10/23 echo.  Echo in 9/24 showed EF 60-65%, mild LVH, D-shaped interventricular septum with severe RV enlargement, mild RV dysfunction, PASP 50 mmHg, TAVR valve with mean gradient 18 mmHg, mild MR, severe TR, dilated IVC. TEE in 11/24 showed EF 55-60%, mild LVH, mildly D-shaped septum with severe RV enlargement but normal RV systolic function, severe biatrial enlargement, severe TR (suspect atrial functional TR), bioprosthetic aortic valve s/p TAVR with no evidence for thrombus/pannus, trivial perivalvular leakage, and mean gradient down to 13 mmHg.   RHC 11/24: signs of RV failure with elevated RA pressure though PAPi was preserved at 2.8.  TEE 5/25 at [facility]: EF 65%, RV mod dilated, severe TR, mild MR. He ended up going to [facility] for TVR 6/25. Post op TEE showed borderline normal RV function and LVEF ~55%. NYHA class II. Volume overloaded on exam.   - Treat w/ Furoscix , 80 mg dose tonight and repeat 80 mg dose tomorrow w/ 2.5 mg dose of metolazone . Instructed to take an extra 40 mEq of KCl w/ scheduled regimen tomorrow. Then return to 40 mg bid.  - Continue Spironolactone  25 mg daily  - Failed SGLT2i due to side effects  2. Permanent Atrial Fibrillation: rate controlled, pulse rate in the 70s   - On Eliquis , no bleeding issues. 3. Aortic Stenosis s/p TAVR:  stable on TEE 11/24, mean gradient 13 mmHg.  - Stable on TEE 10/25/23. 4. Hypertension: controlled on current regimen  - GDMT per above  5. Tricuspid regurgitation: This was severe on echo in 9/24 with dilated RV and D-shaped interventricular septum.  TEE in 11/24 showed severe TR,  possibly atrial functional TR with dilated right atrium. The RV on 11/24 TEE is severely dilated though overall systolic function appears normal. TEE 10/25/23 with severe TR. He was referred to Dr. Consepcion at [facility] for evaluation for percutaneous tricuspid valve repair but ultimately went to [facility] for mini thoracotomy TV repair 12/03/23. Per Care Everywhere, Post-op TEE EF: 70%, mild RV, trace TR - will obtain post op TTE  6. CAD: LHC at [facility] 6/25 showed severe 1-vessel coronary artery disease of the mid LAD, 80% stenosis. Pt declined PCI and opting on medical therapy. Stable w/o CP - in the absence of ischemic CP and concern regarding med compliance and inability to adhere to DAPT, agree w/ medical management  - no ASA w/ Eliquis  use - continue statin Crestor  10 mg daily. Check LP next f/u appt  Obtain 2D echo and f/u w/ Dr. Rolan next wk    Caffie Shed PA-C  12/20/2023

## 2023-12-20 ENCOUNTER — Encounter (HOSPITAL_COMMUNITY): Payer: Self-pay

## 2023-12-20 ENCOUNTER — Ambulatory Visit (HOSPITAL_COMMUNITY): Payer: Self-pay | Admitting: Cardiology

## 2023-12-20 ENCOUNTER — Ambulatory Visit (HOSPITAL_COMMUNITY)
Admission: RE | Admit: 2023-12-20 | Discharge: 2023-12-20 | Disposition: A | Discharge status: Home / Self Care | Source: Ambulatory Visit | Attending: Cardiology | Admitting: Cardiology

## 2023-12-20 VITALS — BP 122/64 | HR 77 | Wt 211.8 lb

## 2023-12-20 DIAGNOSIS — I5042 Chronic combined systolic (congestive) and diastolic (congestive) heart failure: Secondary | ICD-10-CM | POA: Diagnosis not present

## 2023-12-20 DIAGNOSIS — Z79899 Other long term (current) drug therapy: Secondary | ICD-10-CM | POA: Insufficient documentation

## 2023-12-20 DIAGNOSIS — I082 Rheumatic disorders of both aortic and tricuspid valves: Secondary | ICD-10-CM | POA: Diagnosis not present

## 2023-12-20 DIAGNOSIS — I272 Pulmonary hypertension, unspecified: Secondary | ICD-10-CM | POA: Insufficient documentation

## 2023-12-20 DIAGNOSIS — I2510 Atherosclerotic heart disease of native coronary artery without angina pectoris: Secondary | ICD-10-CM | POA: Diagnosis not present

## 2023-12-20 DIAGNOSIS — I4821 Permanent atrial fibrillation: Secondary | ICD-10-CM | POA: Diagnosis not present

## 2023-12-20 DIAGNOSIS — I110 Hypertensive heart disease with heart failure: Secondary | ICD-10-CM | POA: Insufficient documentation

## 2023-12-20 DIAGNOSIS — Z952 Presence of prosthetic heart valve: Secondary | ICD-10-CM | POA: Insufficient documentation

## 2023-12-20 DIAGNOSIS — Z7901 Long term (current) use of anticoagulants: Secondary | ICD-10-CM | POA: Insufficient documentation

## 2023-12-20 DIAGNOSIS — I5033 Acute on chronic diastolic (congestive) heart failure: Secondary | ICD-10-CM | POA: Diagnosis present

## 2023-12-20 LAB — BASIC METABOLIC PANEL WITH GFR
Anion gap: 10 (ref 5–15)
BUN: 28 mg/dL — ABNORMAL HIGH (ref 8–23)
CO2: 34 mmol/L — ABNORMAL HIGH (ref 22–32)
Calcium: 8.2 mg/dL — ABNORMAL LOW (ref 8.9–10.3)
Chloride: 94 mmol/L — ABNORMAL LOW (ref 98–111)
Creatinine, Ser: 1.43 mg/dL — ABNORMAL HIGH (ref 0.61–1.24)
GFR, Estimated: 49 mL/min — ABNORMAL LOW (ref >60)
Glucose, Bld: 111 mg/dL — ABNORMAL HIGH (ref 70–99)
Potassium: 3.4 mmol/L — ABNORMAL LOW (ref 3.5–5.1)
Sodium: 138 mmol/L (ref 135–145)

## 2023-12-20 LAB — BRAIN NATRIURETIC PEPTIDE: B Natriuretic Peptide: 597.3 pg/mL — ABNORMAL HIGH (ref 0.0–100.0)

## 2023-12-20 MED ORDER — METOLAZONE 2.5 MG PO TABS: 2.5000 mg | ORAL_TABLET | ORAL | 0 refills | Status: DC

## 2023-12-20 NOTE — Patient Instructions (Addendum)
 Use 1 Furoscix  kit tonight.  TOMORROW use 1 Furosicx Kit with a dose of Metolazone  2.5 mg.take an extra 40 mEq of Potassium tomorrow.  HOLD YOUR TORSEMIDE  TONIGHT AND TOMORROW MORNING  Labs done today, your results will be available in MyChart, we will contact you for abnormal readings.  REPEAT blood work in 7 days.  Your physician recommends that you schedule a follow-up appointment 1 month  If you have any questions or concerns before your next appointment please send us  a message through [place] or call our office at [phone].    TO LEAVE A MESSAGE FOR THE NURSE SELECT OPTION 2, PLEASE LEAVE A MESSAGE INCLUDING: YOUR NAME DATE OF BIRTH CALL BACK NUMBER REASON FOR CALL**this is important as we prioritize the call backs  YOU WILL RECEIVE A CALL BACK THE SAME DAY AS LONG AS YOU CALL BEFORE 4:00 PM At the [facility], you and your health needs are our priority. As part of our continuing mission to provide you with exceptional heart care, we have created designated Provider Care Teams. These Care Teams include your primary Cardiologist (physician) and Advanced Practice Providers (APPs- Physician Assistants and Nurse Practitioners) who all work together to provide you with the care you need, when you need it.   You may see any of the following providers on your designated Care Team at your next follow up: Dr Toribio Fuel Dr Ezra Shuck Dr. Ria Commander Dr. Morene Brownie Amy Lenetta, NP Caffie Shed, [place] [facility] [place], [place] Beckey Coe, NP [place] Lee, NP Ellouise Class, NP Tinnie Redman, PharmD Jaun Bash, PharmD   Please be sure to bring in all your medications bottles to every appointment.    Thank you for choosing [facility]

## 2023-12-26 ENCOUNTER — Ambulatory Visit (HOSPITAL_COMMUNITY)
Admission: RE | Admit: 2023-12-26 | Discharge: 2023-12-26 | Disposition: A | Discharge status: Home / Self Care | Source: Ambulatory Visit | Attending: Cardiology | Admitting: Cardiology

## 2023-12-26 DIAGNOSIS — I110 Hypertensive heart disease with heart failure: Secondary | ICD-10-CM | POA: Diagnosis not present

## 2023-12-26 DIAGNOSIS — I5042 Chronic combined systolic (congestive) and diastolic (congestive) heart failure: Secondary | ICD-10-CM | POA: Diagnosis present

## 2023-12-26 DIAGNOSIS — E785 Hyperlipidemia, unspecified: Secondary | ICD-10-CM | POA: Diagnosis not present

## 2023-12-26 DIAGNOSIS — I3481 Nonrheumatic mitral (valve) annulus calcification: Secondary | ICD-10-CM | POA: Insufficient documentation

## 2023-12-26 DIAGNOSIS — I4891 Unspecified atrial fibrillation: Secondary | ICD-10-CM | POA: Diagnosis not present

## 2023-12-26 LAB — ECHOCARDIOGRAM COMPLETE
AR max vel: 2.97 cm2
AV Area VTI: 3.33 cm2
AV Area mean vel: 3.05 cm2
AV Mean grad: 14 mmHg
AV Peak grad: 27.5 mmHg
Ao pk vel: 2.62 m/s
Area-P 1/2: 5.88 cm2
MV VTI: 4.83 cm2
S' Lateral: 4 cm

## 2023-12-31 ENCOUNTER — Ambulatory Visit (HOSPITAL_COMMUNITY)
Admission: RE | Admit: 2023-12-31 | Discharge: 2023-12-31 | Disposition: A | Discharge status: Home / Self Care | Source: Ambulatory Visit | Attending: Cardiology | Admitting: Cardiology

## 2023-12-31 DIAGNOSIS — I5042 Chronic combined systolic (congestive) and diastolic (congestive) heart failure: Secondary | ICD-10-CM | POA: Diagnosis present

## 2023-12-31 LAB — BASIC METABOLIC PANEL WITH GFR
Anion gap: 11 (ref 5–15)
BUN: 30 mg/dL — ABNORMAL HIGH (ref 8–23)
CO2: 39 mmol/L — ABNORMAL HIGH (ref 22–32)
Calcium: 8.6 mg/dL — ABNORMAL LOW (ref 8.9–10.3)
Chloride: 89 mmol/L — ABNORMAL LOW (ref 98–111)
Creatinine, Ser: 1.7 mg/dL — ABNORMAL HIGH (ref 0.61–1.24)
GFR, Estimated: 40 mL/min — ABNORMAL LOW (ref >60)
Glucose, Bld: 111 mg/dL — ABNORMAL HIGH (ref 70–99)
Potassium: 3.3 mmol/L — ABNORMAL LOW (ref 3.5–5.1)
Sodium: 139 mmol/L (ref 135–145)

## 2024-01-04 NOTE — Telephone Encounter (Addendum)
 LVM to discuss tolerance of valsartan. Will follow up.   ADDENDUM 7/22: Spoke with spouse who reports that patient did not start valsartan. BPs 120/80 but patient anxious regarding last time he started BP medications and he became dizzy. Encouraged to start medication as BP is adequate enough that he should be able to tolerate it and should feel better on this medication. Spouse will further discuss with patient. In the meantime, patient reports feeling overall well. Weight down to 195 lbs (baseline 196 on admission but overloaded at that time). His edema is localized to his ankles from lymphatic drainage. Breathing good, walking daily.   Dr. Andra and Dr. Rolan spoke regarding patient's diastolic dysfunction and treatment including first line of afterload reduction with ARB. If patient weren't to tolerate then to proceed with sildenafil. Patient is not expected to have fixed pHTN at this time and symtpoms/PAP should improve with medical management.   Patient has f/u with Dr. Rolan on 8/12 but was recommended to follow up sooner to medical management. Spouse verbalizes understanding of plan and will call the office to arrange. All questions answered at this time.  CT Surgery Post-Discharge Follow-up   Follow-Up Visit: Day 30(+) Who is responding to questions: Patient  Vital Status Confirmed Alive Have you been readmitted to a hospital since discharge from cardiothoracic surgery at [facility]? No If yes, which hospital: N/A Dates readmitted to hospital: n/a Have you visited an ER since your surgery? No If yes, which hospital ER: N/A Date of ER Visit: n/a Have you taken your medications differently than directed since discharge or as prescribed your last visit? Yes Clinician Assessment of Adherence: Patient is following recommendations as instructed []   Upon further discussion, specific challenges with adherence were noted: not adhering to medication or treatment plan Have you had any  difficulties following recommendations for physical activity or nutrition after your surgery? No Clinician Assessment of Adherence: Patient is following recommendations as instructed []   Upon further discussion, specific challenges with adherence were noted: n/a Patient was re-educated on the importance of adherence to medication, diet, physical activity, and follow up.  Was patient referred to any social support services or community-based programs during surgical admission? No If yes, did patient follow up with the services/programs/referrals provided? N/A Follow up Comments: n/a

## 2024-01-08 ENCOUNTER — Telehealth: Payer: Self-pay | Admitting: Cardiology

## 2024-01-08 ENCOUNTER — Encounter (HOSPITAL_COMMUNITY)

## 2024-01-08 NOTE — Telephone Encounter (Signed)
   Patient called this morning reporting that he has felt winded over the last few days. Reports that this is occurring both at rest and with exertion. Reports improved swelling in extremities. No obvious distress noted on the phone. Currently scheduled for office follow up on 8/12. I believe he warrants sooner eval and encouraged him to call clinic back during business hours to schedule appointment. If severe dyspnea, would need to present to the ED.  Artist Pouch, PA-C

## 2024-01-08 NOTE — Telephone Encounter (Signed)
 Left message to call back

## 2024-01-09 ENCOUNTER — Ambulatory Visit (HOSPITAL_COMMUNITY)
Admission: RE | Admit: 2024-01-09 | Discharge: 2024-01-09 | Disposition: A | Discharge status: Home / Self Care | Source: Ambulatory Visit | Attending: Physician Assistant | Admitting: Physician Assistant

## 2024-01-09 ENCOUNTER — Encounter (HOSPITAL_COMMUNITY): Payer: Self-pay

## 2024-01-09 VITALS — BP 120/58 | HR 62 | Ht 74.0 in | Wt 201.0 lb

## 2024-01-09 DIAGNOSIS — I361 Nonrheumatic tricuspid (valve) insufficiency: Secondary | ICD-10-CM

## 2024-01-09 DIAGNOSIS — I2510 Atherosclerotic heart disease of native coronary artery without angina pectoris: Secondary | ICD-10-CM | POA: Diagnosis not present

## 2024-01-09 DIAGNOSIS — I350 Nonrheumatic aortic (valve) stenosis: Secondary | ICD-10-CM

## 2024-01-09 DIAGNOSIS — I5022 Chronic systolic (congestive) heart failure: Secondary | ICD-10-CM

## 2024-01-09 DIAGNOSIS — I272 Pulmonary hypertension, unspecified: Secondary | ICD-10-CM | POA: Diagnosis not present

## 2024-01-09 DIAGNOSIS — I10 Essential (primary) hypertension: Secondary | ICD-10-CM

## 2024-01-09 DIAGNOSIS — Z7901 Long term (current) use of anticoagulants: Secondary | ICD-10-CM | POA: Insufficient documentation

## 2024-01-09 DIAGNOSIS — I083 Combined rheumatic disorders of mitral, aortic and tricuspid valves: Secondary | ICD-10-CM | POA: Insufficient documentation

## 2024-01-09 DIAGNOSIS — I5033 Acute on chronic diastolic (congestive) heart failure: Secondary | ICD-10-CM | POA: Diagnosis present

## 2024-01-09 DIAGNOSIS — D649 Anemia, unspecified: Secondary | ICD-10-CM

## 2024-01-09 DIAGNOSIS — I110 Hypertensive heart disease with heart failure: Secondary | ICD-10-CM | POA: Insufficient documentation

## 2024-01-09 DIAGNOSIS — I4821 Permanent atrial fibrillation: Secondary | ICD-10-CM | POA: Insufficient documentation

## 2024-01-09 DIAGNOSIS — Z952 Presence of prosthetic heart valve: Secondary | ICD-10-CM | POA: Insufficient documentation

## 2024-01-09 DIAGNOSIS — Z79899 Other long term (current) drug therapy: Secondary | ICD-10-CM | POA: Insufficient documentation

## 2024-01-09 DIAGNOSIS — Z91148 Patient's other noncompliance with medication regimen for other reason: Secondary | ICD-10-CM | POA: Insufficient documentation

## 2024-01-09 LAB — IRON AND TIBC
Iron: 28 ug/dL — ABNORMAL LOW (ref 45–182)
Saturation Ratios: 6 % — ABNORMAL LOW (ref 17.9–39.5)
TIBC: 454 ug/dL — ABNORMAL HIGH (ref 250–450)
UIBC: 426 ug/dL

## 2024-01-09 LAB — FERRITIN: Ferritin: 69 ng/mL (ref 24–336)

## 2024-01-09 LAB — BASIC METABOLIC PANEL WITH GFR
Anion gap: 11 (ref 5–15)
BUN: 42 mg/dL — ABNORMAL HIGH (ref 8–23)
CO2: 37 mmol/L — ABNORMAL HIGH (ref 22–32)
Calcium: 8.8 mg/dL — ABNORMAL LOW (ref 8.9–10.3)
Chloride: 93 mmol/L — ABNORMAL LOW (ref 98–111)
Creatinine, Ser: 1.7 mg/dL — ABNORMAL HIGH (ref 0.61–1.24)
GFR, Estimated: 40 mL/min — ABNORMAL LOW (ref >60)
Glucose, Bld: 102 mg/dL — ABNORMAL HIGH (ref 70–99)
Potassium: 3.5 mmol/L (ref 3.5–5.1)
Sodium: 141 mmol/L (ref 135–145)

## 2024-01-09 LAB — BRAIN NATRIURETIC PEPTIDE: B Natriuretic Peptide: 925.1 pg/mL — ABNORMAL HIGH (ref 0.0–100.0)

## 2024-01-09 MED ORDER — APIXABAN 5 MG PO TABS: 5.0000 mg | ORAL_TABLET | Freq: Two times a day (BID) | ORAL | 11 refills | Status: DC

## 2024-01-09 MED ORDER — TORSEMIDE 20 MG PO TABS: 60.0000 mg | ORAL_TABLET | Freq: Two times a day (BID) | ORAL | 3 refills | Status: DC

## 2024-01-09 MED ORDER — POTASSIUM CHLORIDE CRYS ER 20 MEQ PO TBCR
40.0000 meq | EXTENDED_RELEASE_TABLET | Freq: Two times a day (BID) | ORAL | 3 refills | Status: DC
Start: 2024-01-09 — End: 2024-01-29

## 2024-01-09 NOTE — Progress Notes (Addendum)
 ADVANCED HF CLINIC NOTE    PCP: Cleotilde Oneil FALCON, MD HF Cardiology: Dr. 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 with history of permanent atrial fibrillation, aortic stenosis s/p TAVR, and CHF. In 9/23, patient had TAVR for severe AS with 29 mm Edwards Sapien 3 THV.  Prior to TAVR, he had LHC in 8/23 showing minimal CAD.  Initial post-TAVR echo in 10/23 showed EF 60-65%, normal RV, trivial MR, TAVR valve with no PVL and mean gradient 15 mmHg.  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 Of note, patient was not taking Eliquis  at at time of 9/24 echo.  He had been concerned about bruising.  He started taking apixaban  in 10/24.   TEE 11/24: EF 55-60%, mild LVH, mildly D-shaped septum with severe RV enlargement but normal RV systolic function, severe biatrial enlargement, severe TR (suspect atrial functional TR), bioprosthetic aortic valve s/p TAVR with no evidence for thrombus/pannus, trivial perivalvular leakage, and mean gradient down to 13 mmHg.  RHC 11/24 showed elevated right-sided filling pressures with mild pulmonary hypertension and preserved PAPi. Torsemide  was increased to 40 qam/20 qpm.   Over the past several months, he has had difficulty w/ ongoing fluid retention/LEE, in the setting of severe TR + in large part to poor compliance w/ diuretics. Has required treatment w/ Furoscix  and referrals to infusion clinic for IV Lasix . He failed SGLT2i. Patient self-discontinued Farxiga  given development of joint pain/ muscle aches.   Fortunately, he was able to get TR valve repair at [facility] 12/03/23, mini thoracotomy. Good result.  Pre-op TEE EF: 55%, mildly RV impaired, trace MR, severe TR. Post-op TEE EF: 70%, mild RV, trace TR. Also of note, pre-op diagnostic LHC showed severe 1-vessel coronary artery disease of the mid LAD, 80% stenosis. Plan was for PCI post TR valve repair however pt declined. Cited that he wanted to wait until he [facility].   He was last seen for follow-up 07/03. He was volume overloaded and given 2 doses of furoscix  + metolazone .   Patient is here today for close CHF follow-up. His weight is down 10 lb from last visit. Still has quite a bit of lower extremity edema but this improving with diuresis and lymphatic massage. Also has a compression wrap on his right lower extremity. He has 2 RLE wounds and would like referral to wound clinic. Shortness of breath with exertion slightly worse than it has been. No orthopnea or PND. He stopped taking spironolactone  as it dropped his blood pressure too much. Off eliquis  d/t concern it may cause bleeding and stopped crestor  as he did not want any additional cholesterol lowering medication.  Recent Cardiac Study at [facility] Echo 12/26/23: EF 55-60%, severe LVH, flattened interventricular septum c/w RV pressure and volume overload, RV enlarged, RVSP 66 mmHg, mean gradient of 4 across tricuspid prosthesis, aortic valve prosthesis okay  Recent Cardiac Studies (done at [facility])   Studies: Prior cath: 11/30/23   Severe 1-vessel coronary artery disease of the mid LAD (80% stenosis)   Estimated blood loss: 0-50 cc.   TEE 12/03/23:  POST-PROCEDURE: --The patient is status post TV  Repair. --Post bypass s/p tricuspid annuloplasty with trace residual tricuspid insufficiency. There is  borderline normal RV funvtion and the LVEF is ~ 55%. -Otherwise, similar to baseline study   PMH: 1. HTN 2. Hyperlipidemia 3. Aortic stenosis: Severe. S/p TAVR in 9/23 with 29 mm Edwards Sapien 3 THV.  - Echo (10/23): EF 60-65%, normal RV, trivial MR, TAVR valve with  no PVL and mean gradient 15 mmHg.  - Echo (9/24): EF 60-65%, mild LVH, D-shaped interventricular septum with severe RV enlargement, mild RV dysfunction, PASP 50 mmHg, TAVR valve with mean gradient 18 mmHg, mild MR, severe TR, dilated IVC.  - TEE (11/24): EF 55-60%, mild LVH, mildly D-shaped septum with severe RV enlargement but normal RV systolic function, severe biatrial enlargement, severe TR (suspect atrial functional TR), bioprosthetic aortic valve s/p TAVR with no evidence for thrombus/pannus, trivial perivalvular leakage, and mean gradient down to 13 mmHg.  4. Tricuspid regurgitation: Severe.  Noted first on 9/24 echo.  TEE (11/24): EF 55-60%, mild LVH, mildly D-shaped septum with severe RV enlargement but normal RV systolic function, severe biatrial enlargement, severe TR (suspect atrial functional TR), bioprosthetic aortic valve s/p TAVR with no evidence for thrombus/pannus, trivial perivalvular leakage, and mean gradient down to 13 mmHg.  - RHC (11/24): Mean RA 10 with v-waves to 19; PA 42/14, mean 28; mean PCWP 10, CI 4.74, PVR 1.7 WU, PAPi 2.8.  - TEE (5/25 at [facility]): LVEF 65%, RV moderately reduced, well-seated TAVR valve, mild MR, severe TR associated with annular dilation. 5. LHC (8/23) with minimal CAD (pre-TAVR).  6. Atrial fibrillation: Permanent  SH: Married, lives in [place], owns company in [place], nonsmoker, rare ETOH.   Family History  Problem Relation Age of Onset   Colon cancer Mother    Alzheimer's disease Father    CVA Father    Diabetes Brother    ROS: All systems reviewed and negative except as per HPI.   Current Outpatient Medications  Medication Sig Dispense Refill   amoxicillin  (AMOXIL ) 500 MG tablet Take 4 tablets (2,000 mg total) by mouth as directed. 1 HOUR PRIOR TO DENTAL APPOINTMENTS 12 tablet 6   buprenorphine -naloxone  (SUBOXONE ) 8-2 mg SUBL SL tablet Place 2 tablets under the tongue daily.     clonazePAM  (KLONOPIN ) 0.5 MG tablet Take 0.5 mg by  mouth 3 (three) times daily as needed for anxiety.     Collagen Hydrolysate POWD Take 3 applicators by mouth 2 (two) times daily.     Digestive Enzyme CAPS Take 1 capsule by mouth 2 (two) times daily.     metolazone  (ZAROXOLYN ) 2.5 MG tablet Take 1 tablet (2.5 mg total) by mouth as directed. 8 tablet 0   Multiple Vitamin (MULTIVITAMIN WITH MINERALS) TABS tablet Take 3 tablets by mouth daily. Total minerals     Probiotic Product (PROBIOTIC PO) Take 1 capsule by mouth 2 (two) times daily. total flora     testosterone  cypionate (DEPOTESTOSTERONE CYPIONATE) 200 MG/ML injection Inject 300 mg into the muscle every 28 (twenty-eight) days.     apixaban  (ELIQUIS ) 5 MG TABS tablet Take 1 tablet (5 mg total) by mouth 2 (two) times daily. 60 tablet 11   potassium chloride  SA (KLOR-CON  M) 20 MEQ tablet Take 2 tablets (40 mEq total) by mouth 2 (two) times daily. 180 tablet 3   torsemide  (DEMADEX ) 20 MG tablet Take 3 tablets (60 mg total) by mouth 2 (two) times daily. 200 tablet 3   No current facility-administered medications for this encounter.   Wt  Readings from Last 3 Encounters:  01/09/24 91.2 kg (201 lb)  12/20/23 96.1 kg (211 lb 12.8 oz)  11/26/23 88.2 kg (194 lb 6.4 oz)   BP (!) 120/58   Pulse 62   Ht 6' 2 (1.88 m)   Wt 91.2 kg (201 lb)   SpO2 96%   BMI 25.81 kg/m   PHYSICAL EXAM: General:  Elderly male Cor: JVP 10-12. Regular rate & rhythm. No murmurs. Lungs: decreased in bases Abdomen: mildly distended Extremities: 2+ b/l LE edema to thighs, compression wrap RLE Neuro: alert & orientedx3. Affect pleasant    Assessment/Plan: 1. 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  This is a new finding on the 9/24 echo compared to 10/23 echo.  Echo in 9/24 showed EF 60-65%, mild LVH, D-shaped interventricular septum with severe RV enlargement, mild RV dysfunction, PASP 50 mmHg, TAVR valve with mean gradient 18  mmHg, mild MR, severe TR, dilated IVC. TEE in 11/24 showed EF 55-60%, mild LVH, mildly D-shaped septum with severe RV enlargement but normal RV systolic function, severe biatrial enlargement, severe TR (suspect atrial functional TR), bioprosthetic aortic valve s/p TAVR with no evidence for thrombus/pannus, trivial perivalvular leakage, and mean gradient down to 13 mmHg.   RHC 11/24: signs of RV failure with elevated RA pressure though PAPi was preserved at 2.8.  TEE 5/25 at [facility]: EF 65%, RV mod dilated, severe TR, mild MR. He ended up going to [facility] for TVR 6/25. Post op TEE showed borderline normal RV function and LVEF ~55%. Echo 12/26/23: EF 55-60%, severe LVH, flattened interventricular septum c/w RV pressure and volume overload, RV enlarged, RVSP 66 mmHg, mean gradient of 4 across tricuspid prosthesis - NYHA class III. Volume overloaded on exam and by ReDS which is 49%. Increase Torsemide  to 60 mg BID and take 2.5 mg metolazone  tomorrow am. Increase KCL to 40 mEq BID and take extra 40 mEq with metolazone .  - Continue compression and lymphatic massage. Seems to be helping to mobilize fluid. - He stopped spironolactone  d/t concern for hypotension. He does not want to retrial. - Failed SGLT2i due to side effects  - check labs today again in 1 week 2. Permanent Atrial Fibrillation: Rate controlled. - He stopped Eliquis  d/t concern for bleeding but agrees to restart 5 mg BID.  - No major bleeding issues reported 3. Aortic Stenosis s/p TAVR:  stable on TEE 11/24, mean gradient 13 mmHg.  - Stable on TTE 07/25 4. Hypertension: Controlled - Off most blood pressure lowering meds 5. Tricuspid regurgitation: This was severe on echo in 9/24 with dilated RV and D-shaped interventricular septum.  TEE in 11/24 showed severe TR, possibly atrial functional TR with dilated right atrium. The RV on 11/24 TEE is severely dilated though overall systolic function appears normal. TEE 10/25/23 with severe  TR. He was referred to Dr. Consepcion at [facility] for evaluation for percutaneous tricuspid valve repair but ultimately went to [facility] for mini thoracotomy TV repair 12/03/23. Per Care Everywhere, Post-op TEE EF: 70%, mild RV, trace TR - Recent echo 07/25 as above 6. CAD: LHC at [facility] 6/25 showed severe 1-vessel coronary artery disease of the mid LAD, 80% stenosis. Pt declined PCI and opting on medical therapy. Stable w/o CP - in the absence of ischemic CP and concern regarding med compliance and inability to adhere to DAPT, agree w/ medical management  -  no ASA w/ Eliquis  use - He stopped statin, does not want to restart for now 7. RLE wounds: Refer to wound clinic 8. Anemia: Hgb 8 on labs at PCP's office this week -Had been 9s post-op in June -Check iron studies  Follow-up: As scheduled 02-17-2024 with Dr. Rolan DUTCH, [facility] PA-C  01/09/2024

## 2024-01-09 NOTE — Progress Notes (Signed)
 ReDS Vest / Clip - 01/09/24 1600       ReDS Vest / Clip   Station Marker C    Ruler Value 26    ReDS Value Range High volume overload    ReDS Actual Value 49

## 2024-01-09 NOTE — Patient Instructions (Signed)
 INCREASE Torsemide  to 60 mg Twice daily  INCREASE Potassium to 40 mEq ( 2 Tabs) Twice daily  TAKE 1 METOLAZONE  TABLET TOMORROW MORNING ONLY. TAKE AN EXTRA 2 POTASSIUM TABLETS WITH YOUR METOLAZONE  TOMORROW MORNING.  Labs done today, your results will be available in MyChart, we will contact you for abnormal readings.  Repeat blood work in a week.  You have been referred to the wound clinic. They will call you to arrange your appointment.  Your physician recommends that you schedule a follow-up appointment as scheduled.  If you have any questions or concerns before your next appointment please send us  a message through [place] or call our office at [phone].    TO LEAVE A MESSAGE FOR THE NURSE SELECT OPTION 2, PLEASE LEAVE A MESSAGE INCLUDING: YOUR NAME DATE OF BIRTH CALL BACK NUMBER REASON FOR CALL**this is important as we prioritize the call backs  YOU WILL RECEIVE A CALL BACK THE SAME DAY AS LONG AS YOU CALL BEFORE 4:00 PM  At the [facility], you and your health needs are our priority. As part of our continuing mission to provide you with exceptional heart care, we have created designated Provider Care Teams. These Care Teams include your primary Cardiologist (physician) and Advanced Practice Providers (APPs- Physician Assistants and Nurse Practitioners) who all work together to provide you with the care you need, when you need it.   You may see any of the following providers on your designated Care Team at your next follow up: Dr Toribio Fuel Dr Ezra Shuck Dr. Ria Commander Dr. Morene Brownie Amy Lenetta, NP Caffie Shed, [place] [facility] [place], [place] Beckey Coe, NP [place] Lee, NP Ellouise Class, NP Tinnie Redman, PharmD Jaun Bash, PharmD   Please be sure to bring in all your medications bottles to every appointment.    Thank you for choosing [facility]

## 2024-01-10 ENCOUNTER — Ambulatory Visit (HOSPITAL_COMMUNITY): Payer: Self-pay | Admitting: Physician Assistant

## 2024-01-10 DIAGNOSIS — I5032 Chronic diastolic (congestive) heart failure: Secondary | ICD-10-CM

## 2024-01-10 DIAGNOSIS — D649 Anemia, unspecified: Secondary | ICD-10-CM

## 2024-01-10 DIAGNOSIS — I5042 Chronic combined systolic (congestive) and diastolic (congestive) heart failure: Secondary | ICD-10-CM

## 2024-01-14 ENCOUNTER — Encounter (HOSPITAL_BASED_OUTPATIENT_CLINIC_OR_DEPARTMENT_OTHER): Attending: Internal Medicine | Admitting: Internal Medicine

## 2024-01-14 DIAGNOSIS — I872 Venous insufficiency (chronic) (peripheral): Secondary | ICD-10-CM | POA: Diagnosis not present

## 2024-01-14 DIAGNOSIS — I50812 Chronic right heart failure: Secondary | ICD-10-CM | POA: Diagnosis not present

## 2024-01-14 DIAGNOSIS — L97818 Non-pressure chronic ulcer of other part of right lower leg with other specified severity: Secondary | ICD-10-CM | POA: Insufficient documentation

## 2024-01-15 ENCOUNTER — Telehealth (HOSPITAL_COMMUNITY): Payer: Self-pay

## 2024-01-15 NOTE — Telephone Encounter (Signed)
 Called and spoke with patients wife (okay per DPR), she states that patient has been taking torsemide  60mg  twice daily but reports that patient did not take dose of metolazone  with extra potassium as directed at last OV.   Spoke with Manuelita, [place] and she advised that patient go ahead and take dose of metolazone  today, with the additional potassium as directed at last OV.   Advised patients wife to go ahead and have patient take metolazone  2.5mg  this morning, with 40meq of potassium. She is aware and will have patient take this.   Advised her to call back with any issues, questions, or concerns and she verbalized understanding.

## 2024-01-15 NOTE — Telephone Encounter (Signed)
-----   Message from [facility], [place] N sent at 01/15/2024  7:03 AM EDT ----- Please call patient, sounds like he may not be taking his diuretics correctly. ----- Message ----- From: Rufus Ozell MATSU, MD Sent: 01/14/2024  12:32 PM EDT To: Manuelita Nat Dutch, PA-C  HI lindsay;   This is MIke Robson over at the wound care center. I saw Mr. Kusch at your request. 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 I see that you actually adjusted his torsemide  to 60 twice daily and added a single dose of metolazone  and some additional potassium but I am not sure that he is actually done this.  He is still on torsemide  2 tablets?  40 mg twice a day.  I am not sure that he actually added the single dose of metolazone  either.  We should be able to get this wound to heal if we can control the edema.  Towards that end I dressed the wound with Hydrofera Blue also added full 4-layer compression.  Hopefully he will be able to tolerate that.  There was no evidence of infection around the wound  I suspect he is going to need close follow-up in your clinic to monitor his progress and diuresis, follow-up renal function etc.  Thanks for you the referral

## 2024-01-16 ENCOUNTER — Other Ambulatory Visit (HOSPITAL_COMMUNITY)

## 2024-01-21 ENCOUNTER — Ambulatory Visit (HOSPITAL_COMMUNITY)
Admission: RE | Admit: 2024-01-21 | Discharge: 2024-01-21 | Disposition: A | Discharge status: Home / Self Care | Source: Ambulatory Visit | Attending: Cardiology | Admitting: Cardiology

## 2024-01-21 ENCOUNTER — Telehealth (HOSPITAL_COMMUNITY): Payer: Self-pay

## 2024-01-21 ENCOUNTER — Telehealth (HOSPITAL_COMMUNITY): Payer: Self-pay | Admitting: Physician Assistant

## 2024-01-21 ENCOUNTER — Encounter (HOSPITAL_BASED_OUTPATIENT_CLINIC_OR_DEPARTMENT_OTHER): Attending: Internal Medicine | Admitting: Internal Medicine

## 2024-01-21 ENCOUNTER — Other Ambulatory Visit (HOSPITAL_COMMUNITY): Payer: Self-pay | Admitting: Physician Assistant

## 2024-01-21 DIAGNOSIS — I872 Venous insufficiency (chronic) (peripheral): Secondary | ICD-10-CM | POA: Insufficient documentation

## 2024-01-21 DIAGNOSIS — S91302A Unspecified open wound, left foot, initial encounter: Secondary | ICD-10-CM | POA: Insufficient documentation

## 2024-01-21 DIAGNOSIS — L97818 Non-pressure chronic ulcer of other part of right lower leg with other specified severity: Secondary | ICD-10-CM | POA: Insufficient documentation

## 2024-01-21 DIAGNOSIS — I5042 Chronic combined systolic (congestive) and diastolic (congestive) heart failure: Secondary | ICD-10-CM

## 2024-01-21 DIAGNOSIS — D509 Iron deficiency anemia, unspecified: Secondary | ICD-10-CM

## 2024-01-21 DIAGNOSIS — I5033 Acute on chronic diastolic (congestive) heart failure: Secondary | ICD-10-CM | POA: Insufficient documentation

## 2024-01-21 DIAGNOSIS — L89156 Pressure-induced deep tissue damage of sacral region: Secondary | ICD-10-CM | POA: Insufficient documentation

## 2024-01-21 DIAGNOSIS — I5032 Chronic diastolic (congestive) heart failure: Secondary | ICD-10-CM

## 2024-01-21 DIAGNOSIS — I50812 Chronic right heart failure: Secondary | ICD-10-CM | POA: Insufficient documentation

## 2024-01-21 DIAGNOSIS — X58XXXA Exposure to other specified factors, initial encounter: Secondary | ICD-10-CM | POA: Diagnosis not present

## 2024-01-21 LAB — BASIC METABOLIC PANEL WITH GFR
Anion gap: 9 (ref 5–15)
BUN: 33 mg/dL — ABNORMAL HIGH (ref 8–23)
CO2: 33 mmol/L — ABNORMAL HIGH (ref 22–32)
Calcium: 8.8 mg/dL — ABNORMAL LOW (ref 8.9–10.3)
Chloride: 98 mmol/L (ref 98–111)
Creatinine, Ser: 1.35 mg/dL — ABNORMAL HIGH (ref 0.61–1.24)
GFR, Estimated: 52 mL/min — ABNORMAL LOW (ref >60)
Glucose, Bld: 97 mg/dL (ref 70–99)
Potassium: 3.6 mmol/L (ref 3.5–5.1)
Sodium: 140 mmol/L (ref 135–145)

## 2024-01-21 NOTE — Telephone Encounter (Signed)
 Auth Submission: NO AUTH NEEDED Site of care: Site of care: [facility] Payer: Medicare A/B, BCBS Supplement Medication & CPT/J Code(s) submitted: Feraheme (ferumoxytol ) [id number] Diagnosis Code: D64.9/I50.32/I50.42  Route of submission (phone, fax, portal):  Phone # Fax # Auth type: Buy/Bill HB Units/visits requested: 510mg  x 2 doses Reference number:  Approval from: 01/21/24 to 07/20/23

## 2024-01-21 NOTE — Telephone Encounter (Signed)
 Patient referred to infusion pharmacy team for ambulatory infusion of IV iron.  Insurance - Medicare  Site of care - Site of care: [facility] Dx code - D64.9/I50.32/I50.42 IV Iron Therapy - Feraheme 510 mg IV x 2  Infusion appointments - Scheduling team will schedule patient as soon as possible.    Devin Buckley, PharmD

## 2024-01-22 ENCOUNTER — Ambulatory Visit (HOSPITAL_COMMUNITY)
Admission: RE | Admit: 2024-01-22 | Discharge: 2024-01-22 | Disposition: A | Discharge status: Home / Self Care | Source: Ambulatory Visit | Attending: Physician Assistant | Admitting: Physician Assistant

## 2024-01-22 DIAGNOSIS — D509 Iron deficiency anemia, unspecified: Secondary | ICD-10-CM | POA: Diagnosis present

## 2024-01-22 DIAGNOSIS — I5042 Chronic combined systolic (congestive) and diastolic (congestive) heart failure: Secondary | ICD-10-CM | POA: Insufficient documentation

## 2024-01-22 DIAGNOSIS — I5032 Chronic diastolic (congestive) heart failure: Secondary | ICD-10-CM | POA: Diagnosis present

## 2024-01-22 MED ORDER — SODIUM CHLORIDE 0.9 % IV SOLN
510.0000 mg | INTRAVENOUS | Status: DC
  Administered 2024-01-22: 510 mg via INTRAVENOUS
  Filled 2024-01-22: qty 510

## 2024-01-28 ENCOUNTER — Encounter (HOSPITAL_BASED_OUTPATIENT_CLINIC_OR_DEPARTMENT_OTHER): Admitting: Internal Medicine

## 2024-01-28 DIAGNOSIS — L97818 Non-pressure chronic ulcer of other part of right lower leg with other specified severity: Secondary | ICD-10-CM | POA: Diagnosis not present

## 2024-01-29 ENCOUNTER — Ambulatory Visit (HOSPITAL_COMMUNITY)
Admission: RE | Admit: 2024-01-29 | Discharge: 2024-01-29 | Disposition: A | Discharge status: Home / Self Care | Source: Ambulatory Visit | Attending: Physician Assistant | Admitting: Physician Assistant

## 2024-01-29 ENCOUNTER — Encounter (HOSPITAL_COMMUNITY): Payer: Self-pay | Admitting: Cardiology

## 2024-01-29 ENCOUNTER — Ambulatory Visit (HOSPITAL_BASED_OUTPATIENT_CLINIC_OR_DEPARTMENT_OTHER)
Admission: RE | Admit: 2024-01-29 | Discharge: 2024-01-29 | Disposition: A | Discharge status: Home / Self Care | Source: Ambulatory Visit | Attending: Cardiology | Admitting: Cardiology

## 2024-01-29 ENCOUNTER — Telehealth (HOSPITAL_COMMUNITY): Payer: Self-pay | Admitting: *Deleted

## 2024-01-29 VITALS — BP 112/54 | HR 61 | Ht 74.0 in | Wt 193.2 lb

## 2024-01-29 DIAGNOSIS — I272 Pulmonary hypertension, unspecified: Secondary | ICD-10-CM | POA: Insufficient documentation

## 2024-01-29 DIAGNOSIS — I082 Rheumatic disorders of both aortic and tricuspid valves: Secondary | ICD-10-CM | POA: Insufficient documentation

## 2024-01-29 DIAGNOSIS — L97529 Non-pressure chronic ulcer of other part of left foot with unspecified severity: Secondary | ICD-10-CM | POA: Insufficient documentation

## 2024-01-29 DIAGNOSIS — I739 Peripheral vascular disease, unspecified: Secondary | ICD-10-CM | POA: Diagnosis present

## 2024-01-29 DIAGNOSIS — L97519 Non-pressure chronic ulcer of other part of right foot with unspecified severity: Secondary | ICD-10-CM | POA: Insufficient documentation

## 2024-01-29 DIAGNOSIS — I4821 Permanent atrial fibrillation: Secondary | ICD-10-CM | POA: Insufficient documentation

## 2024-01-29 DIAGNOSIS — Z7901 Long term (current) use of anticoagulants: Secondary | ICD-10-CM | POA: Insufficient documentation

## 2024-01-29 DIAGNOSIS — I5042 Chronic combined systolic (congestive) and diastolic (congestive) heart failure: Secondary | ICD-10-CM | POA: Insufficient documentation

## 2024-01-29 DIAGNOSIS — Z953 Presence of xenogenic heart valve: Secondary | ICD-10-CM | POA: Insufficient documentation

## 2024-01-29 DIAGNOSIS — I2510 Atherosclerotic heart disease of native coronary artery without angina pectoris: Secondary | ICD-10-CM | POA: Insufficient documentation

## 2024-01-29 DIAGNOSIS — D509 Iron deficiency anemia, unspecified: Secondary | ICD-10-CM | POA: Insufficient documentation

## 2024-01-29 DIAGNOSIS — I110 Hypertensive heart disease with heart failure: Secondary | ICD-10-CM | POA: Insufficient documentation

## 2024-01-29 DIAGNOSIS — I5032 Chronic diastolic (congestive) heart failure: Secondary | ICD-10-CM | POA: Diagnosis present

## 2024-01-29 DIAGNOSIS — Z79899 Other long term (current) drug therapy: Secondary | ICD-10-CM | POA: Insufficient documentation

## 2024-01-29 MED ORDER — ROSUVASTATIN CALCIUM 20 MG PO TABS: 20.0000 mg | ORAL_TABLET | Freq: Every day | ORAL | 3 refills | Status: DC

## 2024-01-29 MED ORDER — TORSEMIDE 20 MG PO TABS: ORAL_TABLET | ORAL | 3 refills | Status: DC

## 2024-01-29 MED ORDER — SODIUM CHLORIDE 0.9 % IV SOLN
510.0000 mg | INTRAVENOUS | Status: DC
  Administered 2024-01-29 (×2): 510 mg via INTRAVENOUS
  Filled 2024-01-29: qty 510

## 2024-01-29 MED ORDER — POTASSIUM CHLORIDE CRYS ER 20 MEQ PO TBCR: EXTENDED_RELEASE_TABLET | ORAL | 3 refills | Status: DC

## 2024-01-29 NOTE — Patient Instructions (Signed)
 Medication Changes:  INCREASE Torsemide  to 60 mg (3 tabs) in AM and 40 mg (2 tabs) in PM  INCREASE Potassium to 60 meq (3 tabs) in AM and 40 meq (2 tabs) in PM  START Crestor  20 mg Daily  Lab Work:  Your physician recommends that you return for lab work in: 1 week  Testing/Procedures:  Your physician has requested that you have a lower extremity arterial duplex. This test is an ultrasound of the arteries in the legs or arms. It looks at arterial blood flow in the legs and arms. Allow one hour for Lower and Upper Arterial scans. There are no restrictions or special instructions.  Please note: We ask at that you not bring children with you during ultrasound (echo/ vascular) testing. Due to room size and safety concerns, children are not allowed in the ultrasound rooms during exams. Our front office staff cannot provide observation of children in our lobby area while testing is being conducted. An adult accompanying a patient to their appointment will only be allowed in the ultrasound room at the discretion of the ultrasound technician under special circumstances. We apologize for any inconvenience.   Referrals:  Your physician recommends that you schedule a follow-up appointment in: Cardiac Rehab, they will call you to schedule  Special Instructions // Education:  Do the following things EVERYDAY: Weigh yourself in the morning before breakfast. Write it down and keep it in a log. Take your medicines as prescribed Eat low salt foods--Limit salt (sodium) to 2000 mg per day.  Stay as active as you can everyday Limit all fluids for the day to less than 2 liters   Follow-Up in: 3 weeks   At the [facility], you and your health needs are our priority. We have a designated team specialized in the treatment of Heart Failure. This Care Team includes your primary Heart Failure Specialized Cardiologist (physician), Advanced Practice Providers (APPs- Physician Assistants and  Nurse Practitioners), and Pharmacist who all work together to provide you with the care you need, when you need it.   You may see any of the following providers on your designated Care Team at your next follow up:  Dr. Toribio Fuel Dr. Ezra Shuck Dr. Ria Commander Dr. Odis Brownie Greig Mosses, NP Caffie Shed, [place] [facility] [place], [place] Beckey Coe, NP [place] Lee, NP Tinnie Redman, PharmD   Please be sure to bring in all your medications bottles to every appointment.   Need to Contact Us :  If you have any questions or concerns before your next appointment please send us  a message through [place] or call our office at [phone].    TO LEAVE A MESSAGE FOR THE NURSE SELECT OPTION 2, PLEASE LEAVE A MESSAGE INCLUDING: YOUR NAME DATE OF BIRTH CALL BACK NUMBER REASON FOR CALL**this is important as we prioritize the call backs  YOU WILL RECEIVE A CALL BACK THE SAME DAY AS LONG AS YOU CALL BEFORE 4:00 PM

## 2024-01-29 NOTE — Telephone Encounter (Signed)
 Received referral from Dr. Mclean for this pt to participate in pulmonary rehab with the diagnosis of chronic combined systolic and diastolic heart failure.  Noed that pt lives in [place].  Called pt and left message to determine his preference of location either [place] or [place].  Contact information provided. Saturnino Bernett PEAK, BSN Cardiac and Emergency planning/management officer

## 2024-01-30 NOTE — Progress Notes (Signed)
 ADVANCED HF CLINIC NOTE    PCP: Cleotilde Oneil FALCON, MD HF Cardiology: Dr. Rolan  Chief complaint: CHF  83 y.o. with history of permanent atrial fibrillation, aortic stenosis s/p TAVR, and CHF. In 9/23, patient had TAVR for severe AS with 29 mm Edwards Sapien 3 THV.  Prior to TAVR, he had LHC in 8/23 showing minimal CAD.  Initial post-TAVR echo in 10/23 showed EF 60-65%, normal RV, trivial MR, TAVR valve with no PVL and mean gradient 15 mmHg.  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 Of note, patient was not taking Eliquis  at at time of 9/24 echo.  He had been concerned about bruising.  He started taking apixaban  in 10/24.   TEE 11/24 showed EF 55-60%, mild LVH, mildly D-shaped septum with severe RV enlargement but normal RV systolic function, severe biatrial enlargement, severe TR (suspect atrial functional TR), bioprosthetic aortic valve s/p TAVR with no evidence for thrombus/pannus, trivial perivalvular leakage, and mean gradient down to 13 mmHg.  RHC 11/24 showed elevated right-sided filling pressures with mild pulmonary hypertension and preserved PAPi. Torsemide  was increased to 40 qam/20 qpm.   He has had difficulty with fluid retention/RV failure in the setting of severe TR + poor compliance w/ diuretics. Has required treatment w/ Furoscix  and referrals to infusion clinic for IV Lasix . He failed SGLT2i. Patient self-discontinued Farxiga  given development of joint pain/ muscle aches.   Patient was evaluated for percutaneous tricuspid valve repair at [facility], but ultimately went to [facility] and had a minimally invasive tricuspid valve repair there on 12/03/23.  Also of note, pre-op diagnostic LHC in 6/25  showed severe 1-vessel coronary artery disease of the mid LAD, 80% stenosis. Plan was for PCI post tricuspid valve repair however patient declined.   At initial followup after TV repair, he was markedly volume overloaded.  Diuretics have been titrated up.  He did not tolerate spironolactone  (says it made him feel too dizzy). Post-op echo here in 7/25 showed EF 55-60%, LVH, flattened interventricular septum c/w RV pressure and volume overload, RV moderately dilated with normal systolic function, RVSP 66 mmHg, moderate RAE/severe LAE, s/p TV repair with mean gradient 4 mmHg and minimal TR, bioprosthetic aortic valve s/p TAVR was stable, dilated IVC.   Patient returns today for followup of CHF.  Weight is down another 8 lbs.  He is taking torsemide  60 mg qam, sometimes takes 20 mg in pm.  Has wounds on feet, following at wound clinic.  Wounds gradually healing. He says that his breathing is better post-TV repair.  No dyspnea walking on flat ground, says he could walk a mile.  No orthopnea/PND.  No lightheadedness. No chest pain.  Legs are still swollen but improving.   Labs (3/25): LDL 85 Labs (8/25): K 3.6, creatinine 1.35, transferrin saturation 6%  PMH: 1. HTN 2. Hyperlipidemia 3. Aortic stenosis: Severe. S/p TAVR in 9/23 with 29 mm Edwards Sapien 3 THV.  - Echo (10/23): EF 60-65%, normal RV, trivial MR, TAVR valve with no PVL and mean gradient 15 mmHg.  - Echo (9/24): EF 60-65%, mild LVH, D-shaped interventricular septum with severe RV enlargement, mild RV dysfunction, PASP 50 mmHg, TAVR valve with mean gradient 18 mmHg, mild MR, severe TR,  dilated IVC.  - TEE (11/24): EF 55-60%, mild LVH, mildly D-shaped septum with severe RV enlargement but normal RV systolic function, severe biatrial enlargement, severe TR (suspect atrial functional TR), bioprosthetic aortic valve s/p TAVR with no evidence for thrombus/pannus, trivial perivalvular leakage, and mean gradient down to 13 mmHg.  - Stable  bioprosthetic aortic valve post-TAVR on 7/25 echo.  4. Tricuspid regurgitation: Severe.  Noted first on 9/24 echo.  TEE (11/24): EF 55-60%, mild LVH, mildly D-shaped septum with severe RV enlargement but normal RV systolic function, severe biatrial enlargement, severe TR (suspect atrial functional TR), bioprosthetic aortic valve s/p TAVR with no evidence for thrombus/pannus, trivial perivalvular leakage, and mean gradient down to 13 mmHg.  - RHC (11/24): Mean RA 10 with v-waves to 19; PA 42/14, mean 28; mean PCWP 10, CI 4.74, PVR 1.7 WU, PAPi 2.8.  - TEE (5/25 at [facility]): LVEF 65%, RV moderately reduced, well-seated TAVR valve, mild MR, severe TR associated with annular dilation. - Minimally invasive tricuspid valve repair at [facility] in 6/25.   - Echo (7/25): EF 55-60%, LVH, flattened interventricular septum c/w RV pressure and volume overload, RV moderately dilated with normal systolic function, RVSP 66 mmHg, moderate RAE/severe LAE, s/p TV repair with mean gradient 4 mmHg and minimal TR, bioprosthetic aortic valve s/p TAVR was stable, dilated IVC.  5. CAD: LHC (8/23) with minimal CAD (pre-TAVR).  LHC 6/25 with 80% mid LAD stenosis, medically managed.  6. Atrial fibrillation: Permanent 7. Iron deficiency anemia.   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   Family History  Problem Relation Age of Onset   Colon cancer Mother    Alzheimer's disease Father    CVA Father    Diabetes Brother    ROS: All systems reviewed and negative except as per HPI.   Current Outpatient Medications  Medication Sig Dispense Refill   amoxicillin  (AMOXIL ) 500 MG tablet Take 4 tablets (2,000 mg total) by mouth as directed. 1 HOUR PRIOR TO DENTAL APPOINTMENTS 12 tablet 6   apixaban  (ELIQUIS ) 5 MG TABS tablet Take 1 tablet (5 mg total) by mouth 2 (two) times daily. 60 tablet 11   buprenorphine -naloxone  (SUBOXONE ) 8-2 mg SUBL SL tablet Place 2 tablets under the tongue daily.      clonazePAM  (KLONOPIN ) 0.5 MG tablet Take 0.5 mg by mouth 3 (three) times daily as needed for anxiety.     Collagen Hydrolysate POWD Take 3 applicators by mouth 2 (two) times daily.     Digestive Enzyme CAPS Take 1 capsule by mouth 2 (two) times daily.     metolazone  (ZAROXOLYN ) 2.5 MG tablet Take 1 tablet (2.5 mg total) by mouth as directed. 8 tablet 0   Multiple Vitamin (MULTIVITAMIN WITH MINERALS) TABS tablet Take 3 tablets by mouth daily. Total minerals     Probiotic Product (PROBIOTIC PO) Take 1 capsule by mouth 2 (two) times daily. total flora     rosuvastatin  (CRESTOR ) 20 MG tablet Take 1 tablet (20 mg total) by mouth daily. 90 tablet 3   testosterone  cypionate (DEPOTESTOSTERONE CYPIONATE) 200 MG/ML injection Inject 300 mg into the muscle every 28 (twenty-eight) days.     potassium chloride  SA (KLOR-CON  M) 20 MEQ tablet Take 3 tablets (60 mEq total) by mouth every morning AND 2 tablets (40 mEq total) every evening. 150 tablet 3   torsemide  (DEMADEX ) 20 MG tablet Take 3 tablets (60 mg total) by mouth in the morning AND 2 tablets (40 mg total) every  evening. 150 tablet 3   No current facility-administered medications for this encounter.   Wt Readings from Last 3 Encounters:  01/29/24 87.6 kg (193 lb 3.2 oz)  01/09/24 91.2 kg (201 lb)  12/20/23 96.1 kg (211 lb 12.8 oz)   BP (!) 112/54   Pulse 61   Ht 6' 2 (1.88 m)   Wt 87.6 kg (193 lb 3.2 oz)   SpO2 96%   BMI 24.81 kg/m   PHYSICAL EXAM: General: NAD Neck: JVP 8-9 cm, no thyromegaly or thyroid nodule.  Lungs: Clear to auscultation bilaterally with normal respiratory effort. CV: Nondisplaced PMI.  Heart irregular S1/S2, no S3/S4, 2/6 SEM RUSB.  2+ edema to knees.  No carotid bruit.  Unable to palpate pedal pulses.  Abdomen: Soft, nontender, no hepatosplenomegaly, no distention.  Skin: Intact without lesions or rashes.  Neurologic: Alert and oriented x 3.  Psych: Normal affect. Extremities: No clubbing or cyanosis. Venous  stasis ulcers on feet.  HEENT: Normal.   Assessment/Plan: 1. 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  This was a new finding on the 9/24 echo compared to 10/23 echo.  Echo in 9/24 showed EF 60-65%, mild LVH, D-shaped interventricular septum with severe RV enlargement, mild RV dysfunction, PASP 50 mmHg, TAVR valve with mean gradient 18 mmHg, mild MR, severe TR, dilated IVC. TEE in 11/24 showed EF 55-60%, mild LVH, mildly D-shaped septum with severe RV enlargement but normal RV systolic function, severe biatrial enlargement, severe TR (suspect atrial functional TR), bioprosthetic aortic valve s/p TAVR with no evidence for thrombus/pannus, trivial perivalvular leakage, and mean gradient down to 13 mmHg.   RHC 11/24 showed signs of RV failure with elevated RA pressure though PAPi was preserved at 2.8.  He ended up going to [facility] for TV repair 6/25. Post op echo in 7/25 showed EF 55-60%, LVH, flattened interventricular septum c/w RV pressure and volume overload, RV moderately dilated with normal systolic function, RVSP 66 mmHg, moderate RAE/severe LAE, s/p TV repair with mean gradient 4 mmHg and minimal TR, bioprosthetic aortic valve s/p TAVR was stable, dilated IVC. Patient was significantly volume overloaded post-op.  He has been gradually diuresing, weight down another 8 lbs but he is still volume overloaded. NYHA class II.  - Increase torsemide  to 60 qam/40 qpm (has been taking 60 mg daily).  Increase KCl to 60 qam/40 qpm.  BMET/BNP in 1 week.  - Continue compression and lymphatic massage. Seems to be helping to mobilize fluid. - He stopped spironolactone  due to dizziness. He does not want to retry. - Failed SGLT2i due to side effects  2. Permanent Atrial Fibrillation: Rate controlled. - Continue Eliquis  5 mg BID.  3. Aortic Stenosis s/p TAVR:  stable on 7/25 echo.  4. Hypertension: Controlled 5.  Tricuspid regurgitation: This was severe on echo in 9/24 with dilated RV and D-shaped interventricular septum.  TEE in 11/24 showed severe TR, possibly atrial functional TR with dilated right atrium. The RV on 11/24 TEE is severely dilated though overall systolic function appears normal. TEE 10/25/23 with severe TR. He was referred to Dr. Consepcion at [facility] for evaluation for percutaneous tricuspid valve repair but ultimately went to [facility] for mini thoracotomy TV repair 12/03/23. Echo in 7/25 with TV mean gradient 4 mmHg and minimal TR.  - Will need antibiotics with dental work.  6. CAD: LHC at [facility] 6/25 showed  severe 1-vessel coronary artery disease of the mid LAD, 80% stenosis. Pt declined PCI and opted for medical therapy. No chest pain.  - At this point, in absence of chest pain and with normal LV EF, will not push for PCI.  - He needs to start statin, we discussed this today.  He will take Crestor  20 mg daily aiming for goal LDL < 55.  Check lipids and LFTs in 2 months.  - no ASA w/ Eliquis  use 7. Bilateral foot ulcers: Following in wound clinic.  Suspect venous stasis ulcers with h/o marked peripheral edema.  - Will get peripheral arterial dopplers to rule out significant PAD.  8. Anemia: Fe deficiency anemia.  - He has had IV Fe.   Followup 3 wks with APP. Will need close followup with volume overload and encouragement to take his medications as prescribed.   I spent 41 minutes reviewing records, interviewing/examining patient, and managing orders.   Devin Buckley   01/30/2024

## 2024-01-31 ENCOUNTER — Encounter (HOSPITAL_COMMUNITY): Payer: Self-pay

## 2024-02-04 ENCOUNTER — Encounter (HOSPITAL_BASED_OUTPATIENT_CLINIC_OR_DEPARTMENT_OTHER): Admitting: Internal Medicine

## 2024-02-04 DIAGNOSIS — L97818 Non-pressure chronic ulcer of other part of right lower leg with other specified severity: Secondary | ICD-10-CM | POA: Diagnosis not present

## 2024-02-04 DIAGNOSIS — I872 Venous insufficiency (chronic) (peripheral): Secondary | ICD-10-CM

## 2024-02-06 ENCOUNTER — Ambulatory Visit (HOSPITAL_COMMUNITY)
Admission: RE | Admit: 2024-02-06 | Discharge: 2024-02-06 | Disposition: A | Discharge status: Home / Self Care | Source: Ambulatory Visit | Attending: Cardiology | Admitting: Cardiology

## 2024-02-06 ENCOUNTER — Ambulatory Visit (HOSPITAL_COMMUNITY): Payer: Self-pay | Admitting: Cardiology

## 2024-02-06 DIAGNOSIS — I5042 Chronic combined systolic (congestive) and diastolic (congestive) heart failure: Secondary | ICD-10-CM | POA: Insufficient documentation

## 2024-02-06 LAB — BASIC METABOLIC PANEL WITH GFR
Anion gap: 13 (ref 5–15)
BUN: 22 mg/dL (ref 8–23)
CO2: 31 mmol/L (ref 22–32)
Calcium: 9 mg/dL (ref 8.9–10.3)
Chloride: 98 mmol/L (ref 98–111)
Creatinine, Ser: 1.32 mg/dL — ABNORMAL HIGH (ref 0.61–1.24)
GFR, Estimated: 54 mL/min — ABNORMAL LOW (ref >60)
Glucose, Bld: 119 mg/dL — ABNORMAL HIGH (ref 70–99)
Potassium: 4.4 mmol/L (ref 3.5–5.1)
Sodium: 142 mmol/L (ref 135–145)

## 2024-02-06 LAB — CBC
HCT: 32.4 % — ABNORMAL LOW (ref 39.0–52.0)
Hemoglobin: 9.8 g/dL — ABNORMAL LOW (ref 13.0–17.0)
MCH: 27.8 pg (ref 26.0–34.0)
MCHC: 30.2 g/dL (ref 30.0–36.0)
MCV: 91.8 fL (ref 80.0–100.0)
Platelets: 139 K/uL — ABNORMAL LOW (ref 150–400)
RBC: 3.53 MIL/uL — ABNORMAL LOW (ref 4.22–5.81)
RDW: 22.5 % — ABNORMAL HIGH (ref 11.5–15.5)
WBC: 4.5 K/uL (ref 4.0–10.5)
nRBC: 0 % (ref 0.0–0.2)

## 2024-02-06 LAB — BRAIN NATRIURETIC PEPTIDE: B Natriuretic Peptide: 570.5 pg/mL — ABNORMAL HIGH (ref 0.0–100.0)

## 2024-02-07 ENCOUNTER — Other Ambulatory Visit (HOSPITAL_COMMUNITY)

## 2024-02-11 ENCOUNTER — Encounter (HOSPITAL_BASED_OUTPATIENT_CLINIC_OR_DEPARTMENT_OTHER): Admitting: Internal Medicine

## 2024-02-11 ENCOUNTER — Telehealth (HOSPITAL_COMMUNITY): Payer: Self-pay

## 2024-02-11 DIAGNOSIS — S91302A Unspecified open wound, left foot, initial encounter: Secondary | ICD-10-CM

## 2024-02-11 DIAGNOSIS — I50812 Chronic right heart failure: Secondary | ICD-10-CM

## 2024-02-11 DIAGNOSIS — L97818 Non-pressure chronic ulcer of other part of right lower leg with other specified severity: Secondary | ICD-10-CM

## 2024-02-11 NOTE — Telephone Encounter (Signed)
 Attempted to contact the patient to schedule VAS [place]. No answer. Left message. First Attempt Provided direct contact number for scheduling: [phone].

## 2024-02-14 ENCOUNTER — Telehealth (HOSPITAL_COMMUNITY): Payer: Self-pay

## 2024-02-14 NOTE — Telephone Encounter (Signed)
 Attempted f/u call regarding pulmonary rehab- no answer, left message. Sent MyChart message.  Closing referral.

## 2024-02-19 ENCOUNTER — Encounter (HOSPITAL_BASED_OUTPATIENT_CLINIC_OR_DEPARTMENT_OTHER): Attending: Internal Medicine | Admitting: Internal Medicine

## 2024-02-19 DIAGNOSIS — W228XXA Striking against or struck by other objects, initial encounter: Secondary | ICD-10-CM | POA: Insufficient documentation

## 2024-02-19 DIAGNOSIS — L97818 Non-pressure chronic ulcer of other part of right lower leg with other specified severity: Secondary | ICD-10-CM

## 2024-02-19 DIAGNOSIS — I872 Venous insufficiency (chronic) (peripheral): Secondary | ICD-10-CM | POA: Diagnosis not present

## 2024-02-19 DIAGNOSIS — S91302A Unspecified open wound, left foot, initial encounter: Secondary | ICD-10-CM | POA: Diagnosis present

## 2024-02-19 DIAGNOSIS — S90811A Abrasion, right foot, initial encounter: Secondary | ICD-10-CM | POA: Diagnosis not present

## 2024-02-19 DIAGNOSIS — S91301A Unspecified open wound, right foot, initial encounter: Secondary | ICD-10-CM | POA: Diagnosis not present

## 2024-02-19 DIAGNOSIS — I5042 Chronic combined systolic (congestive) and diastolic (congestive) heart failure: Secondary | ICD-10-CM | POA: Insufficient documentation

## 2024-02-19 DIAGNOSIS — I50812 Chronic right heart failure: Secondary | ICD-10-CM | POA: Diagnosis not present

## 2024-02-20 ENCOUNTER — Encounter (HOSPITAL_COMMUNITY)

## 2024-02-22 ENCOUNTER — Telehealth (HOSPITAL_COMMUNITY): Payer: Self-pay

## 2024-02-22 NOTE — Progress Notes (Signed)
 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 In 9/23, patient had TAVR for severe AS with 29 mm Edwards Sapien 3 THV.  Prior to TAVR, he had LHC in 8/23 showing minimal CAD.  Initial post-TAVR echo in 10/23 showed EF 60-65%, normal RV, trivial MR, TAVR valve with no PVL and mean gradient 15 mmHg.  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 Of note, patient was not taking Eliquis  at at time of 9/24 echo.  He had been concerned about bruising.  He started taking apixaban  in 10/24.   TEE 11/24 showed EF 55-60%, mild LVH, mildly D-shaped septum with severe RV enlargement but normal RV systolic function, severe biatrial enlargement, severe TR (suspect atrial functional TR), bioprosthetic aortic valve s/p TAVR with no evidence for thrombus/pannus, trivial perivalvular leakage, and mean gradient down to 13 mmHg.  RHC 11/24 showed elevated right-sided filling pressures with mild pulmonary hypertension and preserved PAPi. Torsemide  was increased to 40 qam/20 qpm.   He has had difficulty with fluid retention/RV failure in the setting of severe TR + poor compliance w/ diuretics. Has required treatment w/ Furoscix  and referrals to infusion clinic for IV Lasix . He failed SGLT2i. Patient self-discontinued Farxiga  given development of joint pain/ muscle aches.   Patient was evaluated for percutaneous tricuspid valve repair at [facility], but ultimately went to [facility] and had a minimally invasive tricuspid valve repair there on 12/03/23.  Also of note, pre-op diagnostic LHC in 6/25 showed severe 1-vessel  coronary artery disease of the mid LAD, 80% stenosis. Plan was for PCI post tricuspid valve repair however patient declined.   At initial followup after TV repair, he was markedly volume overloaded.  Diuretics have been titrated up.  He did not tolerate spironolactone  (says it made him feel too dizzy). Post-op echo here in 7/25 showed EF 55-60%, LVH, flattened interventricular septum c/w RV pressure and volume overload, RV moderately dilated with normal systolic function, RVSP 66 mmHg, moderate RAE/severe LAE, s/p TV repair with mean gradient 4 mmHg and minimal TR, bioprosthetic aortic valve s/p TAVR was stable, dilated IVC.   Today he returns for HF follow up with his wife. Overall feeling fine. Swelling is down, self reduced torsemide  to 70 mg daily, he and wife concerned over renal function and legs were getting skinny. Breathing has improved. Denies  palpitations, abnormal bleeding, CP, dizziness, or PND/Orthopnea. Appetite ok. Weight at home 190 pounds. Taking all medications. He stopped Crestor  as he was concerned it was dropping his BP.  ReDs reading: 37%, abnormal  Labs (3/25): LDL 85 Labs (8/25): K 3.6, creatinine 1.35, transferrin saturation 6%  PMH: 1. HTN 2. Hyperlipidemia 3. Aortic stenosis: Severe. S/p TAVR in 9/23 with 29 mm Edwards Sapien 3 THV.  - Echo (10/23): EF 60-65%, normal RV, trivial MR, TAVR valve with no PVL and mean gradient 15 mmHg.  - Echo (9/24): EF 60-65%, mild LVH, D-shaped interventricular septum with severe RV enlargement, mild RV dysfunction, PASP 50 mmHg, TAVR valve with mean gradient 18 mmHg, mild MR, severe TR, dilated IVC.  - TEE (11/24): EF 55-60%, mild LVH,  mildly D-shaped septum with severe RV enlargement but normal RV systolic function, severe biatrial enlargement, severe TR (suspect atrial functional TR), bioprosthetic aortic valve s/p TAVR with no evidence for thrombus/pannus, trivial perivalvular leakage, and mean gradient down to 13 mmHg.  - Stable  bioprosthetic aortic valve post-TAVR on 7/25 echo.  4. Tricuspid regurgitation: Severe.  Noted first on 9/24 echo.  TEE (11/24): EF 55-60%, mild LVH, mildly D-shaped septum with severe RV enlargement but normal RV systolic function, severe biatrial enlargement, severe TR (suspect atrial functional TR), bioprosthetic aortic valve s/p TAVR with no evidence for thrombus/pannus, trivial perivalvular leakage, and mean gradient down to 13 mmHg.  - RHC (11/24): Mean RA 10 with v-waves to 19; PA 42/14, mean 28; mean PCWP 10, CI 4.74, PVR 1.7 WU, PAPi 2.8.  - TEE (5/25 at [facility]): LVEF 65%, RV moderately reduced, well-seated TAVR valve, mild MR, severe TR associated with annular dilation. - Minimally invasive tricuspid valve repair at [facility] in 6/25.   - Echo (7/25): EF 55-60%, LVH, flattened interventricular septum c/w RV pressure and volume overload, RV moderately dilated with normal systolic function, RVSP 66 mmHg, moderate RAE/severe LAE, s/p TV repair with mean gradient 4 mmHg and minimal TR, bioprosthetic aortic valve s/p TAVR was stable, dilated IVC.  5. CAD: LHC (8/23) with minimal CAD (pre-TAVR).  LHC 6/25 with 80% mid LAD stenosis, medically managed.  6. Atrial fibrillation: Permanent 7. Iron deficiency anemia.   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   Family History  Problem Relation Age of Onset   Colon cancer Mother    Alzheimer's disease Father    CVA Father    Diabetes Brother    ROS: All systems reviewed and negative except as per HPI.   Current Outpatient Medications  Medication Sig Dispense Refill   amoxicillin  (AMOXIL ) 500 MG tablet Take 4 tablets (2,000 mg total) by mouth as directed. 1 HOUR PRIOR TO DENTAL APPOINTMENTS 12 tablet 6   apixaban  (ELIQUIS ) 5 MG TABS tablet Take 1 tablet (5 mg total) by mouth 2 (two) times daily. 60 tablet 11   buprenorphine -naloxone  (SUBOXONE ) 8-2 mg SUBL SL tablet Place 2 tablets under the tongue daily.      clonazePAM  (KLONOPIN ) 0.5 MG tablet Take 0.5 mg by mouth 3 (three) times daily as needed for anxiety.     Collagen Hydrolysate POWD Take 3 applicators by mouth 2 (two) times daily.     Digestive Enzyme CAPS Take 1 capsule by mouth 2 (two) times daily.     Multiple Vitamin (MULTIVITAMIN WITH MINERALS) TABS tablet Take 3 tablets by mouth daily. Total minerals     potassium chloride  SA (KLOR-CON  M) 20 MEQ tablet Take 3 tablets (60 mEq total) by mouth every morning AND 2 tablets (40 mEq total) every evening. 150 tablet 3   Probiotic Product (PROBIOTIC PO) Take 1 capsule by mouth 2 (two) times daily. total flora     testosterone  cypionate (DEPOTESTOSTERONE CYPIONATE) 200 MG/ML injection Inject 300 mg into the muscle every 28 (twenty-eight) days.     torsemide  (DEMADEX ) 20 MG tablet Take 3 tablets (60 mg total) by mouth in the morning AND 2 tablets (40 mg total) every evening. (Patient taking differently: Take 3 tablets (60 mg total) by mouth daily) 150 tablet 3   metolazone  (ZAROXOLYN ) 2.5 MG tablet Take 1 tablet (2.5 mg total) by mouth as directed. (Patient not taking: Reported on 02/25/2024) 8 tablet 0   rosuvastatin  (CRESTOR ) 20 MG tablet  Take 1 tablet (20 mg total) by mouth daily. (Patient not taking: Reported on 02/25/2024) 90 tablet 3   No current facility-administered medications for this encounter.   Wt Readings from Last 3 Encounters:  02/25/24 86.2 kg (190 lb)  01/29/24 87.6 kg (193 lb 3.2 oz)  01/09/24 91.2 kg (201 lb)   BP (!) 118/58   Pulse 64   Ht 6' 2 (1.88 m)   Wt 86.2 kg (190 lb)   SpO2 98%   BMI 24.39 kg/m   PHYSICAL EXAM: General:  NAD. No resp difficulty, walked into clinic HEENT: Normal Neck: Supple. No JVD. Cor: Irregular rate & rhythm. No rubs, gallops, 2/6 SEM RUSB Lungs: Clear Abdomen: Soft, nontender, nondistended.  Extremities: No cyanosis, clubbing, rash, 2-3+ BLE pre-tibial edema Neuro: Alert & oriented x 3, moves all 4 extremities w/o difficulty. Affect  pleasant.  Assessment/Plan: 1.  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  This was a new finding on the 9/24 echo compared to 10/23 echo.  Echo in 9/24 showed EF 60-65%, mild LVH, D-shaped interventricular septum with severe RV enlargement, mild RV dysfunction, PASP 50 mmHg, TAVR valve with mean gradient 18 mmHg, mild MR, severe TR, dilated IVC. TEE in 11/24 showed EF 55-60%, mild LVH, mildly D-shaped septum with severe RV enlargement but normal RV systolic function, severe biatrial enlargement, severe TR (suspect atrial functional TR), bioprosthetic aortic valve s/p TAVR with no evidence for thrombus/pannus, trivial perivalvular leakage, and mean gradient down to 13 mmHg.   RHC 11/24 showed signs of RV failure with elevated RA pressure though PAPi was preserved at 2.8.  He ended up going to [facility] for TV repair 6/25. Post op echo in 7/25 showed EF 55-60%, LVH, flattened interventricular septum c/w RV pressure and volume overload, RV moderately dilated with normal systolic function, RVSP 66 mmHg, moderate RAE/severe LAE, s/p TV repair with mean gradient 4 mmHg and minimal TR, bioprosthetic aortic valve s/p TAVR was stable, dilated IVC. Patient was significantly volume overloaded post-op.  He has been gradually diuresing, weight down another 3 lbs however he is self reducing his diuretic doses. ReDs 37% - Increase torsemide  back to 60 qam/40 qpm (has been taking 70 mg daily).  Increase KCl to 60 qam/40 qpm.  Recent labs reviewed and are stable, check BMET/BNP in 1 week.  - Continue compression and lymphatic massage. Seems to be helping to mobilize fluid. - He stopped spironolactone  due to dizziness. He does not want to retry. - Failed SGLT2i due to side effects  2. Permanent Atrial Fibrillation: Rate controlled. - Continue Eliquis  5 mg bid. No bleeding issues. 3. Aortic Stenosis s/p TAVR:  stable on 7/25 echo.  4.  Hypertension: Controlled. 5. Tricuspid regurgitation: This was severe on echo in 9/24 with dilated RV and D-shaped interventricular septum.  TEE in 11/24 showed severe TR, possibly atrial functional TR with dilated right atrium. The RV on 11/24 TEE is severely dilated though overall systolic function appears normal. TEE 10/25/23 with severe TR. He was referred to Dr. Consepcion at [facility] for evaluation for percutaneous tricuspid valve repair but ultimately went to [facility] for mini thoracotomy TV repair 12/03/23. Echo in 7/25 with TV mean gradient 4 mmHg and minimal TR.  - Will need antibiotics with dental work.  6. CAD: LHC at [facility] 6/25 showed severe 1-vessel coronary artery disease of the mid LAD, 80% stenosis. Pt declined  PCI and opted for medical therapy. No chest pain.  - At this point, in absence of chest pain and with normal LV EF, will not push for PCI.  - LDL < 55.  He was started on a statin last visit, but stopped due to concerns it dropped his BP. Re-address this next visit. Would re-start Crestor  20 mg daily if he is willing. - no ASA w/ Eliquis  use 7. Bilateral foot ulcers: Following in wound clinic.  Suspect venous stasis ulcers with h/o marked peripheral edema.  - Peripheral arterial dopplers to rule out significant PAD has been arranged. - He is followed by Wound Clinic, he says his wounds are healing.  8. Anemia: Fe deficiency anemia.  - He has had IV Fe.   Follow up in 2 months with APP.   Harlene HERO [facility] FNP-BC 02/25/2024

## 2024-02-22 NOTE — Telephone Encounter (Signed)
 Called to confirm/remind patient of their appointment at the [facility] on 02/25/24.   Appointment:   [] Confirmed  [x] Left mess   [] No answer/No voice mail  [] VM Full/unable to leave message  [] Phone not in service  And to bring in all medications and/or complete list.

## 2024-02-25 ENCOUNTER — Encounter (HOSPITAL_BASED_OUTPATIENT_CLINIC_OR_DEPARTMENT_OTHER): Admitting: Internal Medicine

## 2024-02-25 ENCOUNTER — Encounter (HOSPITAL_COMMUNITY): Payer: Self-pay

## 2024-02-25 ENCOUNTER — Ambulatory Visit (HOSPITAL_COMMUNITY)
Admission: RE | Admit: 2024-02-25 | Discharge: 2024-02-25 | Disposition: A | Discharge status: Home / Self Care | Source: Ambulatory Visit | Attending: Family Medicine | Admitting: Family Medicine

## 2024-02-25 VITALS — BP 118/58 | HR 64 | Ht 74.0 in | Wt 190.0 lb

## 2024-02-25 DIAGNOSIS — I10 Essential (primary) hypertension: Secondary | ICD-10-CM | POA: Diagnosis not present

## 2024-02-25 DIAGNOSIS — Z953 Presence of xenogenic heart valve: Secondary | ICD-10-CM | POA: Diagnosis not present

## 2024-02-25 DIAGNOSIS — I110 Hypertensive heart disease with heart failure: Secondary | ICD-10-CM | POA: Diagnosis not present

## 2024-02-25 DIAGNOSIS — D509 Iron deficiency anemia, unspecified: Secondary | ICD-10-CM | POA: Diagnosis not present

## 2024-02-25 DIAGNOSIS — I2510 Atherosclerotic heart disease of native coronary artery without angina pectoris: Secondary | ICD-10-CM | POA: Diagnosis not present

## 2024-02-25 DIAGNOSIS — L97519 Non-pressure chronic ulcer of other part of right foot with unspecified severity: Secondary | ICD-10-CM | POA: Diagnosis not present

## 2024-02-25 DIAGNOSIS — S91302A Unspecified open wound, left foot, initial encounter: Secondary | ICD-10-CM | POA: Diagnosis not present

## 2024-02-25 DIAGNOSIS — I5032 Chronic diastolic (congestive) heart failure: Secondary | ICD-10-CM | POA: Insufficient documentation

## 2024-02-25 DIAGNOSIS — Z7901 Long term (current) use of anticoagulants: Secondary | ICD-10-CM | POA: Diagnosis not present

## 2024-02-25 DIAGNOSIS — I872 Venous insufficiency (chronic) (peripheral): Secondary | ICD-10-CM | POA: Diagnosis not present

## 2024-02-25 DIAGNOSIS — I5042 Chronic combined systolic (congestive) and diastolic (congestive) heart failure: Secondary | ICD-10-CM

## 2024-02-25 DIAGNOSIS — I082 Rheumatic disorders of both aortic and tricuspid valves: Secondary | ICD-10-CM | POA: Diagnosis not present

## 2024-02-25 DIAGNOSIS — D649 Anemia, unspecified: Secondary | ICD-10-CM

## 2024-02-25 DIAGNOSIS — L97818 Non-pressure chronic ulcer of other part of right lower leg with other specified severity: Secondary | ICD-10-CM

## 2024-02-25 DIAGNOSIS — I350 Nonrheumatic aortic (valve) stenosis: Secondary | ICD-10-CM

## 2024-02-25 DIAGNOSIS — I4821 Permanent atrial fibrillation: Secondary | ICD-10-CM | POA: Insufficient documentation

## 2024-02-25 DIAGNOSIS — L97529 Non-pressure chronic ulcer of other part of left foot with unspecified severity: Secondary | ICD-10-CM | POA: Diagnosis not present

## 2024-02-25 DIAGNOSIS — I361 Nonrheumatic tricuspid (valve) insufficiency: Secondary | ICD-10-CM

## 2024-02-25 MED ORDER — POTASSIUM CHLORIDE CRYS ER 20 MEQ PO TBCR: EXTENDED_RELEASE_TABLET | ORAL | 11 refills | Status: DC

## 2024-02-25 MED ORDER — TORSEMIDE 20 MG PO TABS: ORAL_TABLET | ORAL | 11 refills | Status: DC

## 2024-02-25 NOTE — Progress Notes (Signed)
 ReDS Vest / Clip - 02/25/24 1400       ReDS Vest / Clip   Station Marker C    Ruler Value 37.5    ReDS Value Range Moderate volume overload    ReDS Actual Value 37

## 2024-02-25 NOTE — Patient Instructions (Addendum)
 Return to taking Torsemide  60 mg in am and 40 mg in pm - Rx sent. Return to taking potassium 60 meq in am and 40 meq in pm - Rx sent. Please return for labs in 7 - 10 days. See below. Return to [facility] in 2 months - see below. Please call us  at [phone] if any questions or concerns prior to your next visit.

## 2024-03-03 ENCOUNTER — Encounter (HOSPITAL_BASED_OUTPATIENT_CLINIC_OR_DEPARTMENT_OTHER): Admitting: Internal Medicine

## 2024-03-03 DIAGNOSIS — S91302A Unspecified open wound, left foot, initial encounter: Secondary | ICD-10-CM

## 2024-03-03 DIAGNOSIS — S91301A Unspecified open wound, right foot, initial encounter: Secondary | ICD-10-CM | POA: Diagnosis not present

## 2024-03-03 DIAGNOSIS — I872 Venous insufficiency (chronic) (peripheral): Secondary | ICD-10-CM | POA: Diagnosis not present

## 2024-03-06 ENCOUNTER — Ambulatory Visit (HOSPITAL_COMMUNITY)
Admission: RE | Admit: 2024-03-06 | Discharge: 2024-03-06 | Disposition: A | Discharge status: Home / Self Care | Source: Ambulatory Visit | Attending: Internal Medicine | Admitting: Internal Medicine

## 2024-03-06 ENCOUNTER — Ambulatory Visit (HOSPITAL_COMMUNITY): Payer: Self-pay | Admitting: Family Medicine

## 2024-03-06 DIAGNOSIS — I5032 Chronic diastolic (congestive) heart failure: Secondary | ICD-10-CM | POA: Insufficient documentation

## 2024-03-06 LAB — BASIC METABOLIC PANEL WITH GFR
Anion gap: 10 (ref 5–15)
BUN: 25 mg/dL — ABNORMAL HIGH (ref 8–23)
CO2: 33 mmol/L — ABNORMAL HIGH (ref 22–32)
Calcium: 8.7 mg/dL — ABNORMAL LOW (ref 8.9–10.3)
Chloride: 97 mmol/L — ABNORMAL LOW (ref 98–111)
Creatinine, Ser: 1.11 mg/dL (ref 0.61–1.24)
GFR, Estimated: >60 mL/min (ref >60)
Glucose, Bld: 115 mg/dL — ABNORMAL HIGH (ref 70–99)
Potassium: 3.8 mmol/L (ref 3.5–5.1)
Sodium: 140 mmol/L (ref 135–145)

## 2024-03-06 LAB — BRAIN NATRIURETIC PEPTIDE: B Natriuretic Peptide: 449.6 pg/mL — ABNORMAL HIGH (ref 0.0–100.0)

## 2024-03-10 ENCOUNTER — Encounter (HOSPITAL_BASED_OUTPATIENT_CLINIC_OR_DEPARTMENT_OTHER): Admitting: Internal Medicine

## 2024-03-10 DIAGNOSIS — S90811D Abrasion, right foot, subsequent encounter: Secondary | ICD-10-CM

## 2024-03-10 DIAGNOSIS — S91302A Unspecified open wound, left foot, initial encounter: Secondary | ICD-10-CM | POA: Diagnosis not present

## 2024-03-10 DIAGNOSIS — S91301A Unspecified open wound, right foot, initial encounter: Secondary | ICD-10-CM | POA: Diagnosis not present

## 2024-03-24 ENCOUNTER — Encounter (HOSPITAL_BASED_OUTPATIENT_CLINIC_OR_DEPARTMENT_OTHER): Attending: Internal Medicine | Admitting: Internal Medicine

## 2024-03-24 DIAGNOSIS — S41101A Unspecified open wound of right upper arm, initial encounter: Secondary | ICD-10-CM | POA: Insufficient documentation

## 2024-03-24 DIAGNOSIS — L03115 Cellulitis of right lower limb: Secondary | ICD-10-CM | POA: Insufficient documentation

## 2024-03-24 DIAGNOSIS — X58XXXA Exposure to other specified factors, initial encounter: Secondary | ICD-10-CM | POA: Diagnosis not present

## 2024-03-24 DIAGNOSIS — S90811D Abrasion, right foot, subsequent encounter: Secondary | ICD-10-CM | POA: Diagnosis not present

## 2024-03-24 DIAGNOSIS — S91301A Unspecified open wound, right foot, initial encounter: Secondary | ICD-10-CM | POA: Insufficient documentation

## 2024-03-24 DIAGNOSIS — I50812 Chronic right heart failure: Secondary | ICD-10-CM | POA: Insufficient documentation

## 2024-03-24 DIAGNOSIS — I872 Venous insufficiency (chronic) (peripheral): Secondary | ICD-10-CM | POA: Diagnosis not present

## 2024-03-31 ENCOUNTER — Encounter (HOSPITAL_BASED_OUTPATIENT_CLINIC_OR_DEPARTMENT_OTHER): Admitting: Internal Medicine

## 2024-04-01 ENCOUNTER — Encounter (HOSPITAL_BASED_OUTPATIENT_CLINIC_OR_DEPARTMENT_OTHER): Admitting: Internal Medicine

## 2024-04-01 DIAGNOSIS — S41101A Unspecified open wound of right upper arm, initial encounter: Secondary | ICD-10-CM | POA: Diagnosis not present

## 2024-04-01 DIAGNOSIS — S91301A Unspecified open wound, right foot, initial encounter: Secondary | ICD-10-CM | POA: Diagnosis not present

## 2024-04-01 DIAGNOSIS — S90811D Abrasion, right foot, subsequent encounter: Secondary | ICD-10-CM

## 2024-04-08 ENCOUNTER — Encounter (HOSPITAL_BASED_OUTPATIENT_CLINIC_OR_DEPARTMENT_OTHER): Admitting: Internal Medicine

## 2024-04-08 DIAGNOSIS — S41101A Unspecified open wound of right upper arm, initial encounter: Secondary | ICD-10-CM

## 2024-04-08 DIAGNOSIS — S90811D Abrasion, right foot, subsequent encounter: Secondary | ICD-10-CM

## 2024-04-08 DIAGNOSIS — S91301A Unspecified open wound, right foot, initial encounter: Secondary | ICD-10-CM | POA: Diagnosis not present

## 2024-04-22 ENCOUNTER — Encounter (HOSPITAL_BASED_OUTPATIENT_CLINIC_OR_DEPARTMENT_OTHER): Admitting: Internal Medicine

## 2024-04-23 ENCOUNTER — Telehealth (HOSPITAL_COMMUNITY): Payer: Self-pay

## 2024-04-23 NOTE — Telephone Encounter (Signed)
 Called to confirm/remind patient of their appointment at the [facility] on 04/24/24.   Appointment:   [] Confirmed  [x] Left mess   [] No answer/No voice mail  [] VM Full/unable to leave message  [] Phone not in service  And to bring in all medications and/or complete list.

## 2024-04-23 NOTE — Progress Notes (Signed)
 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 In 9/23, patient had TAVR for severe AS with 29 mm Edwards Sapien 3 THV.  Prior to TAVR, he had LHC in 8/23 showing minimal CAD.  Initial post-TAVR echo in 10/23 showed EF 60-65%, normal RV, trivial MR, TAVR valve with no PVL and mean gradient 15 mmHg.  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 Of note, patient was not taking Eliquis  at at time of 9/24 echo.  He had been concerned about bruising.  He started taking apixaban  in 10/24.   TEE 11/24 showed EF 55-60%, mild LVH, mildly D-shaped septum with severe RV enlargement but normal RV systolic function, severe biatrial enlargement, severe TR (suspect atrial functional TR), bioprosthetic aortic valve s/p TAVR with no evidence for thrombus/pannus, trivial perivalvular leakage, and mean gradient down to 13 mmHg.  RHC 11/24 showed elevated right-sided filling pressures with mild pulmonary hypertension and preserved PAPi. Torsemide  was increased to 40 qam/20 qpm.   He has had difficulty with fluid retention/RV failure in the setting of severe TR + poor compliance w/ diuretics. Has required treatment w/ Furoscix  and referrals to infusion clinic for IV Lasix . He failed SGLT2i. Patient self-discontinued Farxiga  given development of joint pain/ muscle aches.   Patient was evaluated for percutaneous tricuspid valve repair at [facility], but ultimately went to [facility] and had a minimally invasive tricuspid valve repair there on 12/03/23.  Also of note, pre-op diagnostic LHC in 6/25 showed severe 1-vessel  coronary artery disease of the mid LAD, 80% stenosis. Plan was for PCI post tricuspid valve repair however patient declined.   At initial followup after TV repair, he was markedly volume overloaded.  Diuretics have been titrated up.  He did not tolerate spironolactone  (says it made him feel too dizzy). Post-op echo here in 7/25 showed EF 55-60%, LVH, flattened interventricular septum c/w RV pressure and volume overload, RV moderately dilated with normal systolic function, RVSP 66 mmHg, moderate RAE/severe LAE, s/p TV repair with mean gradient 4 mmHg and minimal TR, bioprosthetic aortic valve s/p TAVR was stable, dilated IVC.   Today he returns for HF follow up with his wife. Overall feeling fine. Swelling is down, self reduced torsemide  to 70 mg daily, he and wife concerned over renal function and legs were getting skinny. Breathing has improved. Denies  palpitations, abnormal bleeding, CP, dizziness, or PND/Orthopnea. Appetite ok. Weight at home 190 pounds. Taking all medications. He stopped Crestor  as he was concerned it was dropping his BP.  ReDs reading: 37%, abnormal  Labs (3/25): LDL 85 Labs (8/25): K 3.6, creatinine 1.35, transferrin saturation 6%  PMH: 1. HTN 2. Hyperlipidemia 3. Aortic stenosis: Severe. S/p TAVR in 9/23 with 29 mm Edwards Sapien 3 THV.  - Echo (10/23): EF 60-65%, normal RV, trivial MR, TAVR valve with no PVL and mean gradient 15 mmHg.  - Echo (9/24): EF 60-65%, mild LVH, D-shaped interventricular septum with severe RV enlargement, mild RV dysfunction, PASP 50 mmHg, TAVR valve with mean gradient 18 mmHg, mild MR, severe TR, dilated IVC.  - TEE (11/24): EF 55-60%, mild LVH,  mildly D-shaped septum with severe RV enlargement but normal RV systolic function, severe biatrial enlargement, severe TR (suspect atrial functional TR), bioprosthetic aortic valve s/p TAVR with no evidence for thrombus/pannus, trivial perivalvular leakage, and mean gradient down to 13 mmHg.  - Stable  bioprosthetic aortic valve post-TAVR on 7/25 echo.  4. Tricuspid regurgitation: Severe.  Noted first on 9/24 echo.  TEE (11/24): EF 55-60%, mild LVH, mildly D-shaped septum with severe RV enlargement but normal RV systolic function, severe biatrial enlargement, severe TR (suspect atrial functional TR), bioprosthetic aortic valve s/p TAVR with no evidence for thrombus/pannus, trivial perivalvular leakage, and mean gradient down to 13 mmHg.  - RHC (11/24): Mean RA 10 with v-waves to 19; PA 42/14, mean 28; mean PCWP 10, CI 4.74, PVR 1.7 WU, PAPi 2.8.  - TEE (5/25 at [facility]): LVEF 65%, RV moderately reduced, well-seated TAVR valve, mild MR, severe TR associated with annular dilation. - Minimally invasive tricuspid valve repair at [facility] in 6/25.   - Echo (7/25): EF 55-60%, LVH, flattened interventricular septum c/w RV pressure and volume overload, RV moderately dilated with normal systolic function, RVSP 66 mmHg, moderate RAE/severe LAE, s/p TV repair with mean gradient 4 mmHg and minimal TR, bioprosthetic aortic valve s/p TAVR was stable, dilated IVC.  5. CAD: LHC (8/23) with minimal CAD (pre-TAVR).  LHC 6/25 with 80% mid LAD stenosis, medically managed.  6. Atrial fibrillation: Permanent 7. Iron deficiency anemia.   SH: Married, lives in [place], owns company in [place], nonsmoker, rare ETOH.   Family History  Problem Relation Age of Onset   Colon cancer Mother    Alzheimer's disease Father    CVA Father    Diabetes Brother    ROS: All systems reviewed and negative except as per HPI.   Current Outpatient Medications  Medication Sig Dispense Refill   amoxicillin  (AMOXIL ) 500 MG tablet Take 4 tablets (2,000 mg total) by mouth as directed. 1 HOUR PRIOR TO DENTAL APPOINTMENTS 12 tablet 6   apixaban  (ELIQUIS ) 5 MG TABS tablet Take 1 tablet (5 mg total) by mouth 2 (two) times daily. 60 tablet 11   buprenorphine -naloxone  (SUBOXONE ) 8-2 mg SUBL SL tablet Place 2 tablets under the tongue daily.      clonazePAM  (KLONOPIN ) 0.5 MG tablet Take 0.5 mg by mouth 3 (three) times daily as needed for anxiety.     Collagen Hydrolysate POWD Take 3 applicators by mouth 2 (two) times daily.     Digestive Enzyme CAPS Take 1 capsule by mouth 2 (two) times daily.     metolazone  (ZAROXOLYN ) 2.5 MG tablet Take 1 tablet (2.5 mg total) by mouth as directed. (Patient not taking: Reported on 02/25/2024) 8 tablet 0   Multiple Vitamin (MULTIVITAMIN WITH MINERALS) TABS tablet Take 3 tablets by mouth daily. Total minerals     potassium chloride  SA (KLOR-CON  M) 20 MEQ tablet Take 3 tablets (60 mEq total) by mouth every morning AND 2 tablets (40 mEq total) every evening. 150 tablet 11   Probiotic Product (PROBIOTIC PO) Take 1 capsule by mouth 2 (two) times daily. total flora     rosuvastatin  (CRESTOR ) 20 MG tablet Take 1 tablet (20 mg total) by mouth daily. (Patient not taking: Reported on 02/25/2024) 90 tablet 3   testosterone  cypionate (DEPOTESTOSTERONE CYPIONATE) 200 MG/ML injection Inject 300 mg into the muscle every 28 (twenty-eight) days.     torsemide  (DEMADEX ) 20 MG tablet Take 3 tablets (60 mg total) by mouth in the morning AND 2 tablets (40 mg  total) every evening. 150 tablet 11   No current facility-administered medications for this visit.   Wt Readings from Last 3 Encounters:  02/25/24 86.2 kg (190 lb)  01/29/24 87.6 kg (193 lb 3.2 oz)  01/09/24 91.2 kg (201 lb)   There were no vitals taken for this visit.  PHYSICAL EXAM: General:  NAD. No resp difficulty, walked into clinic HEENT: Normal Neck: Supple. No JVD. Cor: Irregular rate & rhythm. No rubs, gallops, 2/6 SEM RUSB Lungs: Clear Abdomen: Soft, nontender, nondistended.  Extremities: No cyanosis, clubbing, rash, 2-3+ BLE pre-tibial edema Neuro: Alert & oriented x 3, moves all 4 extremities w/o difficulty. Affect pleasant.  Assessment/Plan: 1.  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  This was a new finding on the 9/24 echo compared to 10/23 echo.  Echo in 9/24 showed EF 60-65%, mild LVH, D-shaped interventricular septum with severe RV enlargement, mild RV dysfunction, PASP 50 mmHg, TAVR valve with mean gradient 18 mmHg, mild MR, severe TR, dilated IVC. TEE in 11/24 showed EF 55-60%, mild LVH, mildly D-shaped septum with severe RV enlargement but normal RV systolic function, severe biatrial enlargement, severe TR (suspect atrial functional TR), bioprosthetic aortic valve s/p TAVR with no evidence for thrombus/pannus, trivial perivalvular leakage, and mean gradient down to 13 mmHg.   RHC 11/24 showed signs of RV failure with elevated RA pressure though PAPi was preserved at 2.8.  He ended up going to [facility] for TV repair 6/25. Post op echo in 7/25 showed EF 55-60%, LVH, flattened interventricular septum c/w RV pressure and volume overload, RV moderately dilated with normal systolic function, RVSP 66 mmHg, moderate RAE/severe LAE, s/p TV repair with mean gradient 4 mmHg and minimal TR, bioprosthetic aortic valve s/p TAVR was stable, dilated IVC. Patient was significantly volume overloaded post-op.  He has been gradually diuresing, weight down another 3 lbs however he is self reducing his diuretic doses. ReDs 37% - Increase torsemide  back to 60 qam/40 qpm (has been taking 70 mg daily).  Increase KCl to 60 qam/40 qpm.  Recent labs reviewed and are stable, check BMET/BNP in 1 week.  - Continue compression and lymphatic massage. Seems to be helping to mobilize fluid. - He stopped spironolactone  due to dizziness. He does not want to retry. - Failed SGLT2i due to side effects  2. Permanent Atrial Fibrillation: Rate controlled. - Continue Eliquis  5 mg bid. No bleeding issues. 3. Aortic Stenosis s/p TAVR:  stable on 7/25 echo.  4. Hypertension: Controlled. 5. Tricuspid regurgitation: This was severe on echo in 9/24 with dilated RV and D-shaped interventricular  septum.  TEE in 11/24 showed severe TR, possibly atrial functional TR with dilated right atrium. The RV on 11/24 TEE is severely dilated though overall systolic function appears normal. TEE 10/25/23 with severe TR. He was referred to Dr. Consepcion at [facility] for evaluation for percutaneous tricuspid valve repair but ultimately went to [facility] for mini thoracotomy TV repair 12/03/23. Echo in 7/25 with TV mean gradient 4 mmHg and minimal TR.  - Will need antibiotics with dental work.  6. CAD: LHC at [facility] 6/25 showed severe 1-vessel coronary artery disease of the mid LAD, 80% stenosis. Pt declined PCI and opted for medical therapy. No chest pain.  - At this point, in absence of chest pain and with normal LV EF, will not push for PCI.  - LDL < 55.  He was started on a statin last visit, but stopped due to concerns it dropped his BP. Re-address this next visit. Would re-start Crestor  20 mg daily if he is willing. - no ASA w/ Eliquis  use 7. Bilateral foot ulcers: Following in wound clinic.  Suspect venous stasis ulcers with h/o marked peripheral edema.  - Peripheral arterial dopplers to rule out significant PAD has been arranged. - He is followed by Wound Clinic, he says his wounds are healing.  8. Anemia: Fe deficiency anemia.  - He has had IV Fe.   Follow up in 2 months with APP.   Caffie Shed FNP-BC 04/23/2024

## 2024-04-24 ENCOUNTER — Ambulatory Visit (HOSPITAL_COMMUNITY)
Admission: RE | Admit: 2024-04-24 | Discharge: 2024-04-24 | Disposition: A | Discharge status: Home / Self Care | Source: Ambulatory Visit | Attending: Cardiology | Admitting: Cardiology

## 2024-04-24 ENCOUNTER — Ambulatory Visit (HOSPITAL_COMMUNITY): Payer: Self-pay | Admitting: Cardiology

## 2024-04-24 ENCOUNTER — Encounter (HOSPITAL_COMMUNITY): Payer: Self-pay

## 2024-04-24 VITALS — BP 118/62 | HR 74 | Wt 189.0 lb

## 2024-04-24 DIAGNOSIS — I2510 Atherosclerotic heart disease of native coronary artery without angina pectoris: Secondary | ICD-10-CM | POA: Insufficient documentation

## 2024-04-24 DIAGNOSIS — L97519 Non-pressure chronic ulcer of other part of right foot with unspecified severity: Secondary | ICD-10-CM | POA: Diagnosis not present

## 2024-04-24 DIAGNOSIS — Z953 Presence of xenogenic heart valve: Secondary | ICD-10-CM | POA: Insufficient documentation

## 2024-04-24 DIAGNOSIS — I5032 Chronic diastolic (congestive) heart failure: Secondary | ICD-10-CM | POA: Diagnosis not present

## 2024-04-24 DIAGNOSIS — Z79899 Other long term (current) drug therapy: Secondary | ICD-10-CM | POA: Insufficient documentation

## 2024-04-24 DIAGNOSIS — D509 Iron deficiency anemia, unspecified: Secondary | ICD-10-CM | POA: Insufficient documentation

## 2024-04-24 DIAGNOSIS — I5042 Chronic combined systolic (congestive) and diastolic (congestive) heart failure: Secondary | ICD-10-CM

## 2024-04-24 DIAGNOSIS — I361 Nonrheumatic tricuspid (valve) insufficiency: Secondary | ICD-10-CM | POA: Insufficient documentation

## 2024-04-24 DIAGNOSIS — I350 Nonrheumatic aortic (valve) stenosis: Secondary | ICD-10-CM | POA: Insufficient documentation

## 2024-04-24 DIAGNOSIS — I110 Hypertensive heart disease with heart failure: Secondary | ICD-10-CM | POA: Diagnosis not present

## 2024-04-24 DIAGNOSIS — L97529 Non-pressure chronic ulcer of other part of left foot with unspecified severity: Secondary | ICD-10-CM | POA: Insufficient documentation

## 2024-04-24 DIAGNOSIS — I447 Left bundle-branch block, unspecified: Secondary | ICD-10-CM | POA: Insufficient documentation

## 2024-04-24 DIAGNOSIS — I4821 Permanent atrial fibrillation: Secondary | ICD-10-CM | POA: Insufficient documentation

## 2024-04-24 DIAGNOSIS — Z7901 Long term (current) use of anticoagulants: Secondary | ICD-10-CM | POA: Insufficient documentation

## 2024-04-24 LAB — BASIC METABOLIC PANEL WITH GFR
Anion gap: 7 (ref 5–15)
BUN: 20 mg/dL (ref 8–23)
CO2: 31 mmol/L (ref 22–32)
Calcium: 8.3 mg/dL — ABNORMAL LOW (ref 8.9–10.3)
Chloride: 102 mmol/L (ref 98–111)
Creatinine, Ser: 1.25 mg/dL — ABNORMAL HIGH (ref 0.61–1.24)
GFR, Estimated: 57 mL/min — ABNORMAL LOW (ref >60)
Glucose, Bld: 116 mg/dL — ABNORMAL HIGH (ref 70–99)
Potassium: 3.9 mmol/L (ref 3.5–5.1)
Sodium: 140 mmol/L (ref 135–145)

## 2024-04-24 LAB — BRAIN NATRIURETIC PEPTIDE: B Natriuretic Peptide: 715.5 pg/mL — ABNORMAL HIGH (ref 0.0–100.0)

## 2024-04-24 MED ORDER — TORSEMIDE 20 MG PO TABS: ORAL_TABLET | ORAL | 11 refills | Status: DC

## 2024-04-24 MED ORDER — ROSUVASTATIN CALCIUM 20 MG PO TABS: 20.0000 mg | ORAL_TABLET | Freq: Every day | ORAL | 3 refills | Status: AC

## 2024-04-24 NOTE — Progress Notes (Signed)
 ReDS Vest / Clip - 04/24/24 0943       ReDS Vest / Clip   Station Marker C    Ruler Value 29    ReDS Value Range High volume overload    ReDS Actual Value 51

## 2024-04-24 NOTE — Patient Instructions (Addendum)
 Good to see you today!    INCREASE torsemide  to 60 mg in  the am and 40 mg in the PM  RESTART Crestor  20 mg daily  Labs done today, your results will be available in MyChart, we will contact you for abnormal readings.  Your physician recommends that you schedule a follow-up appointment  as scheduled  If you have any questions or concerns before your next appointment please send us  a message through [place] or call our office at [phone].    TO LEAVE A MESSAGE FOR THE NURSE SELECT OPTION 2, PLEASE LEAVE A MESSAGE INCLUDING: YOUR NAME DATE OF BIRTH CALL BACK NUMBER REASON FOR CALL**this is important as we prioritize the call backs  YOU WILL RECEIVE A CALL BACK THE SAME DAY AS LONG AS YOU CALL BEFORE 4:00 PM At the [facility], you and your health needs are our priority. As part of our continuing mission to provide you with exceptional heart care, we have created designated Provider Care Teams. These Care Teams include your primary Cardiologist (physician) and Advanced Practice Providers (APPs- Physician Assistants and Nurse Practitioners) who all work together to provide you with the care you need, when you need it.   You may see any of the following providers on your designated Care Team at your next follow up: Dr Toribio Fuel Dr Ezra Shuck Dr. Morene Brownie Greig Mosses, NP Caffie Shed, [place] [facility] Crouch, [place] Beckey Coe, NP Jordan Lee, NP Ellouise Class, NP Tinnie Redman, PharmD Jaun Bash, PharmD   Please be sure to bring in all your medications bottles to every appointment.    Thank you for choosing [facility]

## 2024-04-24 NOTE — Addendum Note (Signed)
 Encounter addended by: Esmirna Ravan B, RN on: 04/24/2024 4:02 PM  Actions taken: Flowsheet accepted, Clinical Note Signed, Charge Capture section accepted

## 2024-04-25 MED ORDER — POTASSIUM CHLORIDE CRYS ER 20 MEQ PO TBCR: EXTENDED_RELEASE_TABLET | ORAL | Status: DC

## 2024-05-07 ENCOUNTER — Telehealth (HOSPITAL_COMMUNITY): Payer: Self-pay

## 2024-05-07 NOTE — Telephone Encounter (Signed)
 Called to confirm/remind patient of their appointment at the [facility] on 05/08/24 9:30.   Appointment:   [] Confirmed  [x] Left mess   [] No answer/No voice mail  [] VM Full/unable to leave message  [] Phone not in service  Patient reminded to bring all medications and/or complete list.  Confirmed patient has transportation. Gave directions, instructed to utilize valet parking.

## 2024-05-07 NOTE — Telephone Encounter (Signed)
 Called to confirm/remind patient of their appointment at the [facility] on 11/20/5.   Appointment:   [] Confirmed  [x] Left mess   [] No answer/No voice mail  [] VM Full/unable to leave message  [] Phone not in service  And to bring in all medications and/or complete list.

## 2024-05-07 NOTE — Progress Notes (Signed)
 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 In 9/23, patient had TAVR for severe AS with 29 mm Edwards Sapien 3 THV.  Prior to TAVR, he had LHC in 8/23 showing minimal CAD.  Initial post-TAVR echo in 10/23 showed EF 60-65%, normal RV, trivial MR, TAVR valve with no PVL and mean gradient 15 mmHg.  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 Of note, patient was not taking Eliquis  at at time of 9/24 echo.  He had been concerned about bruising.  He started taking apixaban  in 10/24.   TEE 11/24 showed EF 55-60%, mild LVH, mildly D-shaped septum with severe RV enlargement but normal RV systolic function, severe biatrial enlargement, severe TR (suspect atrial functional TR), bioprosthetic aortic valve s/p TAVR with no evidence for thrombus/pannus, trivial perivalvular leakage, and mean gradient down to 13 mmHg.  RHC 11/24 showed elevated right-sided filling pressures with mild pulmonary hypertension and preserved PAPi. Torsemide  was increased to 40 qam/20 qpm.   He has had difficulty with fluid retention/RV failure in the setting of severe TR + poor compliance w/ diuretics. Has required treatment w/ Furoscix  and referrals to infusion clinic for IV Lasix . He failed SGLT2i. Patient self-discontinued Farxiga  given development of joint pain/ muscle aches.   Patient was evaluated for percutaneous tricuspid valve repair at [facility], but ultimately went to [facility] and had a minimally invasive tricuspid valve repair there on 12/03/23.  Also of note, pre-op diagnostic LHC in 6/25 showed severe 1-vessel  coronary artery disease of the mid LAD, 80% stenosis. Plan was for PCI post tricuspid valve repair however patient declined.   At initial followup after TV repair, he was markedly volume overloaded.  Diuretics have been titrated up.  He did not tolerate spironolactone  (says it made him feel too dizzy). Post-op echo here in 7/25 showed EF 55-60%, LVH, flattened interventricular septum c/w RV pressure and volume overload, RV moderately dilated with normal systolic function, RVSP 66 mmHg, moderate RAE/severe LAE, s/p TV repair with mean gradient 4 mmHg and minimal TR, bioprosthetic aortic valve s/p TAVR was stable, dilated IVC.   Seen earlier this month for f/u and complained of increase in SOB/ decreased exercise tolerance over the last 2 wks. Prior to this, had been ambulating on the treadmill x 30 min w/o limiations/dyspnea. Recently he started getting SOB after ~15 min on treadmill. He denied associated CP. LEE had improved but he noted some abdominal bloating. He had self decreased his torsemide  dosing. He was instructed at visit prior to this to take 60 mg qam and 40 mg qpm but he had only been taking 40 qam and 20 mg qpm. ReDs was elevated at 51%. EKG showed Afib 75 bpm w/ PVCs. Torsemide  was increased back to 60 qam/40 qpm   ReDs reading: ***,   Labs (3/25): LDL 85 Labs (8/25): K 3.6, creatinine 1.35, transferrin saturation 6% Labs (10/25): K 4.9, creatinine 1.1   PMH: 1. HTN 2. Hyperlipidemia 3. Aortic stenosis: Severe. S/p TAVR in 9/23 with 29 mm Edwards Sapien 3 THV.  - Echo (10/23): EF 60-65%, normal RV, trivial MR,  TAVR valve with no PVL and mean gradient 15 mmHg.  - Echo (9/24): EF 60-65%, mild LVH, D-shaped interventricular septum with severe RV enlargement, mild RV dysfunction, PASP 50 mmHg, TAVR valve with mean gradient 18 mmHg, mild MR, severe TR, dilated IVC.  - TEE (11/24): EF 55-60%, mild LVH, mildly D-shaped septum with severe RV enlargement but normal RV systolic function,  severe biatrial enlargement, severe TR (suspect atrial functional TR), bioprosthetic aortic valve s/p TAVR with no evidence for thrombus/pannus, trivial perivalvular leakage, and mean gradient down to 13 mmHg.  - Stable bioprosthetic aortic valve post-TAVR on 7/25 echo.  4. Tricuspid regurgitation: Severe.  Noted first on 9/24 echo.  TEE (11/24): EF 55-60%, mild LVH, mildly D-shaped septum with severe RV enlargement but normal RV systolic function, severe biatrial enlargement, severe TR (suspect atrial functional TR), bioprosthetic aortic valve s/p TAVR with no evidence for thrombus/pannus, trivial perivalvular leakage, and mean gradient down to 13 mmHg.  - RHC (11/24): Mean RA 10 with v-waves to 19; PA 42/14, mean 28; mean PCWP 10, CI 4.74, PVR 1.7 WU, PAPi 2.8.  - TEE (5/25 at [facility]): LVEF 65%, RV moderately reduced, well-seated TAVR valve, mild MR, severe TR associated with annular dilation. - Minimally invasive tricuspid valve repair at [facility] in 6/25.   - Echo (7/25): EF 55-60%, LVH, flattened interventricular septum c/w RV pressure and volume overload, RV moderately dilated with normal systolic function, RVSP 66 mmHg, moderate RAE/severe LAE, s/p TV repair with mean gradient 4 mmHg and minimal TR, bioprosthetic aortic valve s/p TAVR was stable, dilated IVC.  5. CAD: LHC (8/23) with minimal CAD (pre-TAVR).  LHC 6/25 with 80% mid LAD stenosis, medically managed.  6. Atrial fibrillation: Permanent 7. Iron deficiency anemia.   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   Family History  Problem Relation Age of Onset   Colon cancer Mother    Alzheimer's disease Father    CVA Father    Diabetes Brother    ROS: All systems reviewed and negative except as per HPI.   Current Outpatient Medications  Medication Sig Dispense Refill   amoxicillin  (AMOXIL ) 500 MG tablet Take 4 tablets (2,000 mg total) by mouth as directed. 1 HOUR PRIOR TO DENTAL APPOINTMENTS 12  tablet 6   apixaban  (ELIQUIS ) 5 MG TABS tablet Take 1 tablet (5 mg total) by mouth 2 (two) times daily. 60 tablet 11   buprenorphine -naloxone  (SUBOXONE ) 8-2 mg SUBL SL tablet Place 2 tablets under the tongue daily.     clonazePAM  (KLONOPIN ) 0.5 MG tablet Take 0.5 mg by mouth 3 (three) times daily as needed for anxiety.     Collagen Hydrolysate POWD Take 3 applicators by mouth 2 (two) times daily.     Digestive Enzyme CAPS Take 1 capsule by mouth 2 (two) times daily.     metolazone  (ZAROXOLYN ) 2.5 MG tablet Take 1 tablet (2.5 mg total) by mouth as directed. 8 tablet 0   Multiple Vitamin (MULTIVITAMIN WITH MINERALS) TABS tablet Take 3 tablets by mouth daily. Total minerals     potassium chloride  SA (KLOR-CON  M) 20 MEQ tablet Take 3 tablets (60 mEq total) by mouth every morning AND 3 tablets (60 mEq total) every evening.     Probiotic Product (PROBIOTIC PO) Take 1 capsule by mouth 2 (two) times daily. total flora     rosuvastatin  (CRESTOR ) 20 MG tablet Take 1 tablet (20 mg total) by mouth daily. 90 tablet 3   testosterone  cypionate (DEPOTESTOSTERONE  CYPIONATE) 200 MG/ML injection Inject 300 mg into the muscle every 28 (twenty-eight) days.     torsemide  (DEMADEX ) 20 MG tablet Take 3 tablets (60 mg total) by mouth in the morning AND 2 tablets (40 mg total) every evening. 150 tablet 11   No current facility-administered medications for this visit.   Wt Readings from Last 3 Encounters:  04/24/24 85.7 kg (189 lb)  02/25/24 86.2 kg (190 lb)  01/29/24 87.6 kg (193 lb 3.2 oz)   There were no vitals taken for this visit.  PHYSICAL EXAM ReDs 51%, abnormal/elevated  GENERAL: NAD Lungs- clear  CARDIAC:  JVP 10 cm         Irregularly irregular rate and rhythm. No MRG. Trace pretibial edema (overall much improved from previous visits)  ABDOMEN: Soft, non-tender, non-distended.  EXTREMITIES: Warm and well perfused.  NEUROLOGIC: No obvious FND   Assessment/Plan: 1.  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  This was a new finding on the 9/24 echo compared to 10/23 echo.  Echo in 9/24 showed EF 60-65%, mild LVH, D-shaped interventricular septum with severe RV enlargement, mild RV dysfunction, PASP 50 mmHg, TAVR valve with mean gradient 18 mmHg, mild MR, severe TR, dilated IVC. TEE in 11/24 showed EF 55-60%, mild LVH, mildly D-shaped septum with severe RV enlargement but normal RV systolic function, severe biatrial enlargement, severe TR (suspect atrial functional TR), bioprosthetic aortic valve s/p TAVR with no evidence for thrombus/pannus, trivial perivalvular leakage, and mean gradient down to 13 mmHg.   RHC 11/24 showed signs of RV failure with elevated RA pressure though PAPi was preserved at 2.8.  He ended up going to [facility] for TV repair 6/25. Post op echo in 7/25 showed EF 55-60%, LVH, flattened interventricular septum c/w RV pressure and volume overload, RV moderately dilated with normal systolic function, RVSP 66 mmHg, moderate RAE/severe LAE, s/p TV repair with mean gradient 4 mmHg and minimal TR, bioprosthetic aortic valve s/p TAVR was stable, dilated IVC. He is volume overloaded on exam and by ReDs, 51%, after self decreasing his torsemide . Symptomatic w/ NYHA Class III symptoms.  - Increase torsemide  back to 60 qam/40 qpm. Check BMP and BNP today   - He stopped spironolactone  due to dizziness. He does not want to retry. - Failed SGLT2i due to side effects  2. Permanent Atrial Fibrillation: Rate controlled. - Continue Eliquis  5 mg bid.. 3. Aortic Stenosis s/p TAVR:  stable on 7/25 echo.  4. Hypertension: controlled on current regimen - no change  5. Tricuspid regurgitation: This was severe on echo in 9/24 with dilated RV and D-shaped interventricular septum.  TEE in 11/24 showed severe TR, possibly atrial functional TR with dilated right atrium. The RV on 11/24 TEE is severely dilated though overall  systolic function appears normal. TEE 10/25/23 with severe TR. He was referred to Dr. Consepcion at [facility] for evaluation for percutaneous tricuspid valve repair but ultimately went to [facility] for mini thoracotomy TV repair 12/03/23. Echo in 7/25 with TV mean gradient 4 mmHg and minimal TR.  - Will need antibiotics with dental work.  6. CAD: LHC at [facility] 6/25 showed severe 1-vessel coronary artery disease of the mid LAD, 80% stenosis. Pt declined PCI and opted for medical therapy. No chest pain.  - At this point, in absence of chest pain and with normal LV EF, will not push for PCI. However  if exertional dyspnea/decreases exercise tolerance persists after diuresis, will need to consider referring to IC for PCI. Would refer to Dr. Verlin (did TAVR)  - LDL Goal < 55, most recent level 105.  He was started on a statin last visit, but stopped due to concerns it dropped his BP. Doubt this contributed to this. Discussed retrial and he is in agreement. Will restart Crestor  20 mg daily. If fails retrial then can refer to lipid clinic for PCSK9i - no ASA w/ Eliquis  use 7. Bilateral foot ulcers: Following in wound clinic.  Suspect venous stasis ulcers with h/o marked peripheral edema.  - He is followed by Wound Clinic, he says his wounds are healing but still has some pain which he says feels like neuropathy.  - Peripheral arterial dopplers to rule out significant PAD has been arranged. Needs to schedule  8. Anemia: Fe deficiency anemia.  - He has had IV Fe.   Follow up w/ APP in 2 wks to reassess volume status and symptoms   Caffie Shed PA-C  05/07/2024

## 2024-05-08 ENCOUNTER — Encounter (HOSPITAL_BASED_OUTPATIENT_CLINIC_OR_DEPARTMENT_OTHER): Attending: Internal Medicine | Admitting: Internal Medicine

## 2024-05-08 ENCOUNTER — Ambulatory Visit (HOSPITAL_COMMUNITY)
Admission: RE | Admit: 2024-05-08 | Discharge: 2024-05-08 | Disposition: A | Discharge status: Home / Self Care | Source: Ambulatory Visit | Attending: Cardiology | Admitting: Cardiology

## 2024-05-08 ENCOUNTER — Encounter (HOSPITAL_COMMUNITY): Payer: Self-pay

## 2024-05-08 ENCOUNTER — Ambulatory Visit (HOSPITAL_COMMUNITY): Payer: Self-pay | Admitting: Cardiology

## 2024-05-08 VITALS — BP 124/60 | HR 89 | Ht 74.0 in | Wt 192.4 lb

## 2024-05-08 DIAGNOSIS — I110 Hypertensive heart disease with heart failure: Secondary | ICD-10-CM | POA: Diagnosis present

## 2024-05-08 DIAGNOSIS — Z953 Presence of xenogenic heart valve: Secondary | ICD-10-CM | POA: Diagnosis not present

## 2024-05-08 DIAGNOSIS — Z91148 Patient's other noncompliance with medication regimen for other reason: Secondary | ICD-10-CM | POA: Diagnosis not present

## 2024-05-08 DIAGNOSIS — E785 Hyperlipidemia, unspecified: Secondary | ICD-10-CM | POA: Insufficient documentation

## 2024-05-08 DIAGNOSIS — Z79899 Other long term (current) drug therapy: Secondary | ICD-10-CM | POA: Diagnosis not present

## 2024-05-08 DIAGNOSIS — L97519 Non-pressure chronic ulcer of other part of right foot with unspecified severity: Secondary | ICD-10-CM | POA: Insufficient documentation

## 2024-05-08 DIAGNOSIS — I5032 Chronic diastolic (congestive) heart failure: Secondary | ICD-10-CM | POA: Diagnosis present

## 2024-05-08 DIAGNOSIS — D509 Iron deficiency anemia, unspecified: Secondary | ICD-10-CM | POA: Insufficient documentation

## 2024-05-08 DIAGNOSIS — I082 Rheumatic disorders of both aortic and tricuspid valves: Secondary | ICD-10-CM | POA: Diagnosis not present

## 2024-05-08 DIAGNOSIS — L97529 Non-pressure chronic ulcer of other part of left foot with unspecified severity: Secondary | ICD-10-CM | POA: Diagnosis not present

## 2024-05-08 DIAGNOSIS — I4821 Permanent atrial fibrillation: Secondary | ICD-10-CM | POA: Insufficient documentation

## 2024-05-08 DIAGNOSIS — E8770 Fluid overload, unspecified: Secondary | ICD-10-CM | POA: Diagnosis not present

## 2024-05-08 DIAGNOSIS — I519 Heart disease, unspecified: Secondary | ICD-10-CM | POA: Insufficient documentation

## 2024-05-08 DIAGNOSIS — I2510 Atherosclerotic heart disease of native coronary artery without angina pectoris: Secondary | ICD-10-CM | POA: Insufficient documentation

## 2024-05-08 DIAGNOSIS — I5042 Chronic combined systolic (congestive) and diastolic (congestive) heart failure: Secondary | ICD-10-CM

## 2024-05-08 DIAGNOSIS — S41101A Unspecified open wound of right upper arm, initial encounter: Secondary | ICD-10-CM

## 2024-05-08 DIAGNOSIS — I872 Venous insufficiency (chronic) (peripheral): Secondary | ICD-10-CM | POA: Diagnosis not present

## 2024-05-08 DIAGNOSIS — S90811D Abrasion, right foot, subsequent encounter: Secondary | ICD-10-CM | POA: Diagnosis not present

## 2024-05-08 DIAGNOSIS — X58XXXA Exposure to other specified factors, initial encounter: Secondary | ICD-10-CM | POA: Diagnosis not present

## 2024-05-08 DIAGNOSIS — Z7901 Long term (current) use of anticoagulants: Secondary | ICD-10-CM | POA: Diagnosis not present

## 2024-05-08 DIAGNOSIS — D539 Nutritional anemia, unspecified: Secondary | ICD-10-CM | POA: Diagnosis not present

## 2024-05-08 DIAGNOSIS — I50812 Chronic right heart failure: Secondary | ICD-10-CM | POA: Diagnosis not present

## 2024-05-08 DIAGNOSIS — L03115 Cellulitis of right lower limb: Secondary | ICD-10-CM | POA: Diagnosis not present

## 2024-05-08 DIAGNOSIS — I878 Other specified disorders of veins: Secondary | ICD-10-CM | POA: Diagnosis not present

## 2024-05-08 DIAGNOSIS — S41102A Unspecified open wound of left upper arm, initial encounter: Secondary | ICD-10-CM

## 2024-05-08 DIAGNOSIS — S91301A Unspecified open wound, right foot, initial encounter: Secondary | ICD-10-CM | POA: Diagnosis present

## 2024-05-08 LAB — COMPREHENSIVE METABOLIC PANEL WITH GFR
ALT: 20 U/L (ref 0–44)
AST: 38 U/L (ref 15–41)
Albumin: 2.6 g/dL — ABNORMAL LOW (ref 3.5–5.0)
Alkaline Phosphatase: 135 U/L — ABNORMAL HIGH (ref 38–126)
Anion gap: 13 (ref 5–15)
BUN: 35 mg/dL — ABNORMAL HIGH (ref 8–23)
CO2: 29 mmol/L (ref 22–32)
Calcium: 8.8 mg/dL — ABNORMAL LOW (ref 8.9–10.3)
Chloride: 100 mmol/L (ref 98–111)
Creatinine, Ser: 1.48 mg/dL — ABNORMAL HIGH (ref 0.61–1.24)
GFR, Estimated: 47 mL/min — ABNORMAL LOW (ref >60)
Glucose, Bld: 117 mg/dL — ABNORMAL HIGH (ref 70–99)
Potassium: 4.2 mmol/L (ref 3.5–5.1)
Sodium: 142 mmol/L (ref 135–145)
Total Bilirubin: 0.9 mg/dL (ref 0.0–1.2)
Total Protein: 6.7 g/dL (ref 6.5–8.1)

## 2024-05-08 LAB — BRAIN NATRIURETIC PEPTIDE: B Natriuretic Peptide: 718.7 pg/mL — ABNORMAL HIGH (ref 0.0–100.0)

## 2024-05-08 MED ORDER — TORSEMIDE 20 MG PO TABS: ORAL_TABLET | ORAL | 11 refills | Status: AC

## 2024-05-08 MED ORDER — METOLAZONE 2.5 MG PO TABS: 2.5000 mg | ORAL_TABLET | ORAL | 0 refills | Status: DC

## 2024-05-08 NOTE — Progress Notes (Signed)
 ReDS Vest / Clip - 05/08/24 0900       ReDS Vest / Clip   Station Marker C    Ruler Value 27    ReDS Value Range High volume overload    ReDS Actual Value 56

## 2024-05-08 NOTE — Patient Instructions (Signed)
 Medication Changes:  INCREASE TORSEMIDE  TO 80MG  (4) TABLETS IN THE MORNING AND 60MG  (3) TABLETS IN THE EVENING   TAKE METOLAZONE  2.5MG  DAILY FOR THE NEXT 2 DAYS---TAKE AN EXTRA OF POTASSIUM WITH THIS   TAKE METOLAZONE  ONCE A WEEK STARTING ON MONDAY 11/24--- TAKE EXTRA OF POTASSIUM WITH THIS  Lab Work:  Labs done today, your results will be available in MyChart, we will contact you for abnormal readings.  Testing/Procedures:  ECHOCARDIOGRAM TOMORROW AT 3PM AS SCHEDULED   Special Instructions // Education:  PLEASE WEAR COMPRESSION STOCKINGS   Follow-Up in: 2 WEEKS AS SCHEDULED WITH APP CLINIC   At the [facility], you and your health needs are our priority. We have a designated team specialized in the treatment of Heart Failure. This Care Team includes your primary Heart Failure Specialized Cardiologist (physician), Advanced Practice Providers (APPs- Physician Assistants and Nurse Practitioners), and Pharmacist who all work together to provide you with the care you need, when you need it.   You may see any of the following providers on your designated Care Team at your next follow up:  Dr. Toribio Fuel Dr. Ezra Shuck Dr. Odis Brownie Greig Mosses, NP Caffie Shed, [place] [facility] [place], [place] Beckey Coe, NP Jordan Lee, NP Tinnie Redman, PharmD   Please be sure to bring in all your medications bottles to every appointment.   Need to Contact Us :  If you have any questions or concerns before your next appointment please send us  a message through [place] or call our office at [phone].    TO LEAVE A MESSAGE FOR THE NURSE SELECT OPTION 2, PLEASE LEAVE A MESSAGE INCLUDING: YOUR NAME DATE OF BIRTH CALL BACK NUMBER REASON FOR CALL**this is important as we prioritize the call backs  YOU WILL RECEIVE A CALL BACK THE SAME DAY AS LONG AS YOU CALL BEFORE 4:00 PM

## 2024-05-09 ENCOUNTER — Ambulatory Visit (HOSPITAL_COMMUNITY)
Admission: RE | Admit: 2024-05-09 | Discharge: 2024-05-09 | Disposition: A | Discharge status: Home / Self Care | Source: Ambulatory Visit | Attending: Cardiology | Admitting: Cardiology

## 2024-05-09 DIAGNOSIS — I110 Hypertensive heart disease with heart failure: Secondary | ICD-10-CM | POA: Diagnosis not present

## 2024-05-09 DIAGNOSIS — I5042 Chronic combined systolic (congestive) and diastolic (congestive) heart failure: Secondary | ICD-10-CM | POA: Diagnosis present

## 2024-05-09 DIAGNOSIS — I77810 Thoracic aortic ectasia: Secondary | ICD-10-CM | POA: Diagnosis not present

## 2024-05-09 DIAGNOSIS — Z952 Presence of prosthetic heart valve: Secondary | ICD-10-CM | POA: Insufficient documentation

## 2024-05-09 DIAGNOSIS — I071 Rheumatic tricuspid insufficiency: Secondary | ICD-10-CM | POA: Insufficient documentation

## 2024-05-09 LAB — ECHOCARDIOGRAM COMPLETE
AR max vel: 2.47 cm2
AV Area VTI: 2.39 cm2
AV Area mean vel: 2.4 cm2
AV Mean grad: 15.2 mmHg
AV Peak grad: 27 mmHg
Ao pk vel: 2.6 m/s
MV M vel: 4.49 m/s
MV Peak grad: 80.6 mmHg
S' Lateral: 3 cm

## 2024-05-09 NOTE — Progress Notes (Signed)
  Echocardiogram 2D Echocardiogram has been performed.  [name] 05/09/2024, 4:18 PM

## 2024-05-21 ENCOUNTER — Telehealth (HOSPITAL_COMMUNITY): Payer: Self-pay

## 2024-05-21 NOTE — Telephone Encounter (Signed)
 Called to confirm/remind patient of their appointment at the [facility] on 05/22/24.   Appointment:   [] Confirmed  [x] Left mess   [] No answer/No voice mail  [] VM Full/unable to leave message  [] Phone not in service  And to bring in all medications and/or complete list.

## 2024-05-22 ENCOUNTER — Other Ambulatory Visit (HOSPITAL_COMMUNITY): Payer: Self-pay

## 2024-05-22 ENCOUNTER — Ambulatory Visit (HOSPITAL_COMMUNITY)
Admission: RE | Admit: 2024-05-22 | Discharge: 2024-05-22 | Disposition: A | Source: Ambulatory Visit | Attending: Physician Assistant

## 2024-05-22 VITALS — BP 138/60 | HR 87 | Wt 188.6 lb

## 2024-05-22 DIAGNOSIS — I361 Nonrheumatic tricuspid (valve) insufficiency: Secondary | ICD-10-CM

## 2024-05-22 DIAGNOSIS — I5032 Chronic diastolic (congestive) heart failure: Secondary | ICD-10-CM | POA: Diagnosis present

## 2024-05-22 DIAGNOSIS — I10 Essential (primary) hypertension: Secondary | ICD-10-CM

## 2024-05-22 DIAGNOSIS — I4821 Permanent atrial fibrillation: Secondary | ICD-10-CM

## 2024-05-22 DIAGNOSIS — I5042 Chronic combined systolic (congestive) and diastolic (congestive) heart failure: Secondary | ICD-10-CM

## 2024-05-22 LAB — COMPREHENSIVE METABOLIC PANEL WITH GFR
ALT: 23 U/L (ref 0–44)
AST: 49 U/L — ABNORMAL HIGH (ref 15–41)
Albumin: 2.2 g/dL — ABNORMAL LOW (ref 3.5–5.0)
Alkaline Phosphatase: 101 U/L (ref 38–126)
Anion gap: 3 — ABNORMAL LOW (ref 5–15)
BUN: 32 mg/dL — ABNORMAL HIGH (ref 8–23)
CO2: 34 mmol/L — ABNORMAL HIGH (ref 22–32)
Calcium: 8.5 mg/dL — ABNORMAL LOW (ref 8.9–10.3)
Chloride: 99 mmol/L (ref 98–111)
Creatinine, Ser: 1.32 mg/dL — ABNORMAL HIGH (ref 0.61–1.24)
GFR, Estimated: 54 mL/min — ABNORMAL LOW (ref >60)
Glucose, Bld: 120 mg/dL — ABNORMAL HIGH (ref 70–99)
Potassium: 4.1 mmol/L (ref 3.5–5.1)
Sodium: 136 mmol/L (ref 135–145)
Total Bilirubin: 1 mg/dL (ref 0.0–1.2)
Total Protein: 5.4 g/dL — ABNORMAL LOW (ref 6.5–8.1)

## 2024-05-22 LAB — BRAIN NATRIURETIC PEPTIDE: B Natriuretic Peptide: 445 pg/mL — ABNORMAL HIGH (ref 0.0–100.0)

## 2024-05-22 MED ORDER — FUROSEMIDE 10 MG/ML IJ SOLN
INTRAMUSCULAR | Status: AC
  Filled 2024-05-22: qty 8

## 2024-05-22 MED ORDER — FUROSEMIDE 10 MG/ML IJ SOLN: 80.0000 mg | Freq: Once | INTRAMUSCULAR | Status: DC

## 2024-05-22 MED ORDER — METOLAZONE 2.5 MG PO TABS: 2.5000 mg | ORAL_TABLET | ORAL | 0 refills | Status: DC

## 2024-05-22 MED ORDER — FUROSEMIDE 10 MG/ML IJ SOLN
80.0000 mg | Freq: Once | INTRAMUSCULAR | Status: AC
  Administered 2024-05-22: 80 mg via INTRAVENOUS

## 2024-05-22 NOTE — Progress Notes (Signed)
 ReDS Vest / Clip - 05/22/24 1300       ReDS Vest / Clip   Station Marker C    Ruler Value 27.5    ReDS Value Range High volume overload    ReDS Actual Value 57

## 2024-05-22 NOTE — Progress Notes (Signed)
 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 In 9/23, patient had TAVR for severe AS with 29 mm Edwards Sapien 3 THV.  Prior to TAVR, he had LHC in 8/23 showing minimal CAD.  Initial post-TAVR echo in 10/23 showed EF 60-65%, normal RV, trivial MR, TAVR valve with no PVL and mean gradient 15 mmHg.  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 Of note, patient was not taking Eliquis  at at time of 9/24 echo.  He had been concerned about bruising.  He started taking apixaban  in 10/24.   TEE 11/24 showed EF 55-60%, mild LVH, mildly D-shaped septum with severe RV enlargement but normal RV systolic function, severe biatrial enlargement, severe TR (suspect atrial functional TR), bioprosthetic aortic valve s/p TAVR with no evidence for thrombus/pannus, trivial perivalvular leakage, and mean gradient down to 13 mmHg.  RHC 11/24 showed elevated right-sided filling pressures with mild pulmonary hypertension and preserved PAPi. Torsemide  was increased to 40 qam/20 qpm.   He has had difficulty with fluid retention/RV failure in the setting of severe TR + poor compliance w/ diuretics. Has required treatment w/ Furoscix  and referrals to infusion clinic for IV Lasix . He failed SGLT2i. Patient self-discontinued Farxiga  given development of joint pain/ muscle aches.   Patient was evaluated for percutaneous tricuspid valve repair at [facility], but ultimately went to [facility] and had a minimally invasive tricuspid valve repair there on 12/03/23.  Also of note, pre-op diagnostic LHC in 6/25 showed severe 1-vessel  coronary artery disease of the mid LAD, 80% stenosis. Plan was for PCI post tricuspid valve repair however patient declined.   At initial followup after TV repair, he was markedly volume overloaded.  Diuretics have been titrated up.  He did not tolerate spironolactone  (says it made him feel too dizzy). Post-op echo here in 7/25 showed EF 55-60%, LVH, flattened interventricular septum c/w RV pressure and volume overload, RV moderately dilated with normal systolic function, RVSP 66 mmHg, moderate RAE/severe LAE, s/p TV repair with mean gradient 4 mmHg and minimal TR, bioprosthetic aortic valve s/p TAVR was stable, dilated IVC.   Has recently struggled with volume overload. Concern for compliance with home meds despite his reports he is taking them as prescribed. Torsemide  was increased and he was instructed to take metolazone  X 2 days. Echo 05/09/24: EF 50-55%, septum flattened in systole and diastole c/w RV overload, RV moderately enlarged, severe BAE, RVSP 70 mmHg, mild to moderate TR, aortic valve prosthesis okay  He is here today for close CHF follow-up. Reports his weight has fluctuated within a couple of pounds. He reports his wife organizes his daily meds for him. He is taking torsemide  and potassium but sounds like he is not taking once weekly metolazone . He walks 1 mile over 30 minutes on treadmill most days. No significant shortness of breath but does report fatigue. Ongoing lower extremity edema, wearing compression stockings. Gets lightheaded on occasion but not predictable. No orthopnea, PND or abdominal bloating. Trying to limit fluid intake but is thirsty all of the time.  ReDs reading: 57%  Labs (3/25): LDL 85 Labs (8/25):  K 3.6, creatinine 1.35, transferrin saturation 6% Labs (10/25): K 4.9, creatinine 1.1  Labs (11/25): K 3.9, creatinine 1.25, BNP 715   PMH: 1. HTN 2. Hyperlipidemia 3. Aortic stenosis: Severe. S/p TAVR in 9/23 with 29 mm Edwards Sapien 3 THV.  - Echo (10/23): EF  60-65%, normal RV, trivial MR, TAVR valve with no PVL and mean gradient 15 mmHg.  - Echo (9/24): EF 60-65%, mild LVH, D-shaped interventricular septum with severe RV enlargement, mild RV dysfunction, PASP 50 mmHg, TAVR valve with mean gradient 18 mmHg, mild MR, severe TR, dilated IVC.  - TEE (11/24): EF 55-60%, mild LVH, mildly D-shaped septum with severe RV enlargement but normal RV systolic function, severe biatrial enlargement, severe TR (suspect atrial functional TR), bioprosthetic aortic valve s/p TAVR with no evidence for thrombus/pannus, trivial perivalvular leakage, and mean gradient down to 13 mmHg.  - Stable bioprosthetic aortic valve post-TAVR on 7/25 echo.  4. Tricuspid regurgitation: Severe.  Noted first on 9/24 echo.  TEE (11/24): EF 55-60%, mild LVH, mildly D-shaped septum with severe RV enlargement but normal RV systolic function, severe biatrial enlargement, severe TR (suspect atrial functional TR), bioprosthetic aortic valve s/p TAVR with no evidence for thrombus/pannus, trivial perivalvular leakage, and mean gradient down to 13 mmHg.  - RHC (11/24): Mean RA 10 with v-waves to 19; PA 42/14, mean 28; mean PCWP 10, CI 4.74, PVR 1.7 WU, PAPi 2.8.  - TEE (5/25 at [facility]): LVEF 65%, RV moderately reduced, well-seated TAVR valve, mild MR, severe TR associated with annular dilation. - Minimally invasive tricuspid valve repair at [facility] in 6/25.   - Echo (7/25): EF 55-60%, LVH, flattened interventricular septum c/w RV pressure and volume overload, RV moderately dilated with normal systolic function, RVSP 66 mmHg, moderate RAE/severe LAE, s/p TV repair with mean gradient 4 mmHg and minimal TR, bioprosthetic aortic valve s/p TAVR was stable, dilated IVC.  5. CAD: LHC (8/23) with minimal CAD (pre-TAVR).  LHC 6/25 with 80% mid LAD stenosis, medically managed.  6. Atrial fibrillation: Permanent 7. Iron deficiency anemia.   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   Family History  Problem Relation Age of Onset   Colon cancer Mother    Alzheimer's disease Father    CVA Father    Diabetes Brother    ROS: All systems reviewed and negative except as per HPI.   Current Outpatient Medications  Medication Sig Dispense Refill   amoxicillin  (AMOXIL ) 500 MG tablet Take 4 tablets (2,000 mg total) by mouth as directed. 1 HOUR PRIOR TO DENTAL APPOINTMENTS 12 tablet 6   apixaban  (ELIQUIS ) 5 MG TABS tablet Take 1 tablet (5 mg total) by mouth 2 (two) times daily. 60 tablet 11   buprenorphine -naloxone  (SUBOXONE ) 8-2 mg SUBL SL tablet Place 2 tablets under the tongue daily.     clonazePAM  (KLONOPIN ) 0.5 MG tablet Take 0.5 mg by mouth 3 (three) times daily as needed for anxiety.     Collagen Hydrolysate POWD Take 3 applicators by mouth 2 (two) times daily.     Digestive Enzyme CAPS Take 1 capsule by mouth 2 (two) times daily.     metolazone  (ZAROXOLYN ) 2.5 MG tablet Take 1 tablet (2.5 mg total) by mouth once a week. Take an extra potassium (20meq) with Metolazone  dose 12 tablet 0   Multiple Vitamin (MULTIVITAMIN WITH MINERALS) TABS tablet Take 3 tablets by mouth daily. Total minerals     potassium chloride  SA (KLOR-CON  M) 20 MEQ tablet  Take 3 tablets (60 mEq total) by mouth every morning AND 3 tablets (60 mEq total) every evening.     Probiotic Product (PROBIOTIC PO) Take 1 capsule by mouth 2 (two) times daily. total flora     testosterone  cypionate (DEPOTESTOSTERONE CYPIONATE) 200 MG/ML injection Inject 300 mg into the muscle every 28 (twenty-eight) days.     torsemide  (DEMADEX ) 20 MG tablet Take 4 tablets (80 mg total) by mouth in the morning AND 3 tablets (60 mg total) every evening. 210 tablet 11   rosuvastatin  (CRESTOR ) 20 MG tablet Take 1 tablet (20 mg total) by mouth daily. 90 tablet 3   No current facility-administered medications for this encounter.   Wt Readings from Last 3 Encounters:  05/22/24 85.5 kg (188 lb 9.6 oz)  05/08/24 87.3 kg (192 lb  6.4 oz)  04/24/24 85.7 kg (189 lb)   BP 138/60   Pulse 87   Wt 85.5 kg (188 lb 9.6 oz)   SpO2 97%   BMI 24.21 kg/m   PHYSICAL EXAM General:  elderly male. Ambulated into clinic. Neck: JVP to jaw with prominent v waves Cor: Irregular rhythm. 2/6 systolic murmur. Lungs: clear Abdomen: nontender, mildly distended Extremities: 2+ edema, + TED hose Neuro: alert & orientedx3. Affect pleasant    Assessment/Plan: 1.  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  This was a new finding on the 9/24 echo compared to 10/23 echo.  Echo in 9/24 showed EF 60-65%, mild LVH, D-shaped interventricular septum with severe RV enlargement, mild RV dysfunction, PASP 50 mmHg, TAVR valve with mean gradient 18 mmHg, mild MR, severe TR, dilated IVC. TEE in 11/24 showed EF 55-60%, mild LVH, mildly D-shaped septum with severe RV enlargement but normal RV systolic function, severe biatrial enlargement, severe TR (suspect atrial functional TR), bioprosthetic aortic valve s/p TAVR with no evidence for thrombus/pannus, trivial perivalvular leakage, and mean gradient down to 13 mmHg.   RHC 11/24 showed signs of RV failure with elevated RA pressure though PAPi was preserved at 2.8.  He ended up going to [facility] for TV repair 6/25. Post op echo in 7/25 showed EF 55-60%, LVH, flattened interventricular septum c/w RV pressure and volume overload, RV moderately dilated with normal systolic function, RVSP 66 mmHg, moderate RAE/severe LAE, s/p TV repair with mean gradient 4 mmHg and minimal TR, TAVR valve was stable, dilated IVC. Echo 05/09/24: EF 50-55%, septum flattened in systole and diastole c/w RV overload, RV moderately enlarged, severe BAE, RVSP 70 mmHg, mild to moderate TR, aortic valve prosthesis okay - He is volume overloaded on exam and by ReDS which is 57%. Suspect med compliance largely playing a role. He has not been taking once  weekly metolazone .  - NYHA III, limited by fatigue. Will send for 80 mg lasix  IV at infusion center today. Take 2.5 mg metolazone  this afternoon with extra 40 mEq potassium. Hold afternoon dose of Torsemide .  - Continue Torsemide  80 q am/60 q pm - Start taking 2.5 mg metolazone  once weekly on Friday (first dose 12/5) with extra 40 mEq KCL - He stopped spironolactone  due to dizziness. He does not want to retry. - Failed SGLT2i due to side effects  - Continue TED hose - Labs today, repeat labs at f/u in 1 week 2. Permanent Atrial Fibrillation: Rate controlled. - Continue Eliquis  5 mg bid.. 3. Aortic Stenosis s/p TAVR:  stable on 11/25 echo.  4. Hypertension: BP above  goal but doubt he can manage any new medications - no change  5. Tricuspid regurgitation: This was severe on echo in 9/24 with dilated RV and D-shaped interventricular septum.  TEE in 11/24 showed severe TR, possibly atrial functional TR with dilated right atrium. The RV on 11/24 TEE is severely dilated though overall systolic function appears normal. TEE 10/25/23 with severe TR. He was referred to Dr. Consepcion at [facility] for evaluation for percutaneous tricuspid valve repair but ultimately went to [facility] for mini thoracotomy TV repair 12/03/23. Echo in 7/25 with TV mean gradient 4 mmHg and minimal TR. Mild to moderate TR on echo 11/25. - optimize RV failure w/ diuresis per above  - Will need antibiotics with dental work.  6. CAD: LHC at [facility] 6/25 showed severe 1-vessel coronary artery disease of the mid LAD, 80% stenosis. Pt declined PCI and opted for medical therapy. No chest pain.  - At this point, in absence of chest pain and with normal LV EF, will not push for PCI. With his poor med compliance, I doubt he would strictly adhere to DAPT  - LDL Goal < 55, most recent level 105.  He was started on a statin last visit, but stopped due to concerns it dropped his BP. Doubt this contributed to this.  Discussed retrial and he is in agreement. Will restart Crestor  20 mg daily. If fails retrial then can refer to lipid clinic for PCSK9i - no ASA w/ Eliquis  use 7. Bilateral foot ulcers: Following in wound clinic.  Suspect venous stasis ulcers with h/o marked peripheral edema.  - Now healed 8. Anemia: Fe deficiency anemia.  - He has had IV Fe.   F/u  Kailon Treese N PA-C  05/22/2024

## 2024-05-22 NOTE — Patient Instructions (Addendum)
 Good to see you today!  IV Lasix  80 mg to be given today at infusion clinic  Metalozone 2.5 mg today then  once a week on Fridays starting 12/5   RESTART torsemide  TOMORROW NO more today  Take an additional dose of potassium 40 meq ( 2 tablets)with metolazone  dose  Labs done today, your results will be available in MyChart, we will contact you for abnormal readings.  Your physician recommends that you schedule a follow-up appointment as scheduled  05/08/2024 Reds 56%(20% -35% normal)  05/22/2024 Reds 57%   If you have any questions or concerns before your next appointment please send us  a message through [place] or call our office at [phone].    TO LEAVE A MESSAGE FOR THE NURSE SELECT OPTION 2, PLEASE LEAVE A MESSAGE INCLUDING: YOUR NAME DATE OF BIRTH CALL BACK NUMBER REASON FOR CALL**this is important as we prioritize the call backs  YOU WILL RECEIVE A CALL BACK THE SAME DAY AS LONG AS YOU CALL BEFORE 4:00 PM At the [facility], you and your health needs are our priority. As part of our continuing mission to provide you with exceptional heart care, we have created designated Provider Care Teams. These Care Teams include your primary Cardiologist (physician) and Advanced Practice Providers (APPs- Physician Assistants and Nurse Practitioners) who all work together to provide you with the care you need, when you need it.   You may see any of the following providers on your designated Care Team at your next follow up: Dr Toribio Fuel Dr Ezra Shuck Dr. Morene Brownie Greig Mosses, NP Caffie Shed, [place] [facility], [place] Beckey Coe, NP Jordan Lee, NP Ellouise Class, NP Tinnie Redman, PharmD Jaun Bash, PharmD   Please be sure to bring in all your medications bottles to every appointment.    Thank you for choosing [facility]

## 2024-05-23 ENCOUNTER — Ambulatory Visit (HOSPITAL_COMMUNITY): Payer: Self-pay | Admitting: Physician Assistant

## 2024-05-26 NOTE — Progress Notes (Incomplete)
 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 In 9/23, patient had TAVR for severe AS with 29 mm Edwards Sapien 3 THV.  Prior to TAVR, he had LHC in 8/23 showing minimal CAD.  Initial post-TAVR echo in 10/23 showed EF 60-65%, normal RV, trivial MR, TAVR valve with no PVL and mean gradient 15 mmHg.  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 Of note, patient was not taking Eliquis  at at time of 9/24 echo.  He had been concerned about bruising.  He started taking apixaban  in 10/24.   TEE 11/24 showed EF 55-60%, mild LVH, mildly D-shaped septum with severe RV enlargement but normal RV systolic function, severe biatrial enlargement, severe TR (suspect atrial functional TR), bioprosthetic aortic valve s/p TAVR with no evidence for thrombus/pannus, trivial perivalvular leakage, and mean gradient down to 13 mmHg.  RHC 11/24 showed elevated right-sided filling pressures with mild pulmonary hypertension and preserved PAPi. Torsemide  was increased to 40 qam/20 qpm.   He has had difficulty with fluid retention/RV failure in the setting of severe TR + poor compliance w/ diuretics. Has required treatment w/ Furoscix  and referrals to infusion clinic for IV Lasix . He failed SGLT2i. Patient self-discontinued Farxiga  given development of joint pain/ muscle aches.   Patient was evaluated for percutaneous tricuspid valve repair at [facility], but ultimately went to [facility] and had a minimally invasive tricuspid valve repair there on 12/03/23.  Also of note, pre-op diagnostic LHC in 6/25 showed severe 1-vessel  coronary artery disease of the mid LAD, 80% stenosis. Plan was for PCI post tricuspid valve repair however patient declined.   At initial followup after TV repair, he was markedly volume overloaded.  Diuretics have been titrated up.  He did not tolerate spironolactone  (says it made him feel too dizzy). Post-op echo here in 7/25 showed EF 55-60%, LVH, flattened interventricular septum c/w RV pressure and volume overload, RV moderately dilated with normal systolic function, RVSP 66 mmHg, moderate RAE/severe LAE, s/p TV repair with mean gradient 4 mmHg and minimal TR, bioprosthetic aortic valve s/p TAVR was stable, dilated IVC.   Has recently struggled with volume overload. Concern for compliance with home meds despite his reports he is taking them as prescribed. Torsemide  was increased and he was instructed to take metolazone  X 2 days. Echo 05/09/24: EF 50-55%, septum flattened in systole and diastole c/w RV overload, RV moderately enlarged, severe BAE, RVSP 70 mmHg, mild to moderate TR, aortic valve prosthesis okay  Today he returns for AHF follow up. At visit last week he was found to be volume overloaded (ReDS 57%), was not taking his metolazone  as prescribed. Overall feeling ***. Denies palpitations, CP, dizziness, edema, or PND/Orthopnea. *** SOB. Appetite ok. No fever or chills. Weight at home *** pounds. Taking all medications. Denies ETOH, tobacco or drug use.   ReDs reading: *** %, {Norm/abn:16337}   PMH: 1. HTN 2. Hyperlipidemia 3. Aortic stenosis: Severe. S/p TAVR in 9/23 with 29 mm Edwards Sapien 3 THV.  - Echo (10/23): EF 60-65%, normal RV, trivial MR, TAVR valve with no PVL and mean gradient 15  mmHg.  - Echo (9/24): EF 60-65%, mild LVH, D-shaped interventricular septum with severe RV enlargement, mild RV dysfunction, PASP 50 mmHg, TAVR valve with mean gradient 18 mmHg, mild MR, severe TR, dilated IVC.  - TEE (11/24): EF 55-60%, mild LVH, mildly D-shaped septum with severe RV enlargement  but normal RV systolic function, severe biatrial enlargement, severe TR (suspect atrial functional TR), bioprosthetic aortic valve s/p TAVR with no evidence for thrombus/pannus, trivial perivalvular leakage, and mean gradient down to 13 mmHg.  - Stable bioprosthetic aortic valve post-TAVR on 7/25 echo.  4. Tricuspid regurgitation: Severe.  Noted first on 9/24 echo.  TEE (11/24): EF 55-60%, mild LVH, mildly D-shaped septum with severe RV enlargement but normal RV systolic function, severe biatrial enlargement, severe TR (suspect atrial functional TR), bioprosthetic aortic valve s/p TAVR with no evidence for thrombus/pannus, trivial perivalvular leakage, and mean gradient down to 13 mmHg.  - RHC (11/24): Mean RA 10 with v-waves to 19; PA 42/14, mean 28; mean PCWP 10, CI 4.74, PVR 1.7 WU, PAPi 2.8.  - TEE (5/25 at [facility]): LVEF 65%, RV moderately reduced, well-seated TAVR valve, mild MR, severe TR associated with annular dilation. - Minimally invasive tricuspid valve repair at [facility] in 6/25.   - Echo (7/25): EF 55-60%, LVH, flattened interventricular septum c/w RV pressure and volume overload, RV moderately dilated with normal systolic function, RVSP 66 mmHg, moderate RAE/severe LAE, s/p TV repair with mean gradient 4 mmHg and minimal TR, bioprosthetic aortic valve s/p TAVR was stable, dilated IVC.  5. CAD: LHC (8/23) with minimal CAD (pre-TAVR).  LHC 6/25 with 80% mid LAD stenosis, medically managed.  6. Atrial fibrillation: Permanent 7. Iron deficiency anemia.   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   Family History  Problem Relation Age of Onset   Colon cancer Mother    Alzheimer's disease Father    CVA Father    Diabetes Brother    ROS: All systems reviewed and negative except as per HPI.   Current Outpatient Medications  Medication Sig Dispense Refill   amoxicillin  (AMOXIL ) 500 MG tablet Take 4 tablets (2,000 mg total) by mouth as directed. 1  HOUR PRIOR TO DENTAL APPOINTMENTS 12 tablet 6   apixaban  (ELIQUIS ) 5 MG TABS tablet Take 1 tablet (5 mg total) by mouth 2 (two) times daily. 60 tablet 11   buprenorphine -naloxone  (SUBOXONE ) 8-2 mg SUBL SL tablet Place 2 tablets under the tongue daily.     clonazePAM  (KLONOPIN ) 0.5 MG tablet Take 0.5 mg by mouth 3 (three) times daily as needed for anxiety.     Collagen Hydrolysate POWD Take 3 applicators by mouth 2 (two) times daily.     Digestive Enzyme CAPS Take 1 capsule by mouth 2 (two) times daily.     metolazone  (ZAROXOLYN ) 2.5 MG tablet Take 1 tablet (2.5 mg total) by mouth once a week. Take an extra potassium (20meq) with Metolazone  dose 12 tablet 0   Multiple Vitamin (MULTIVITAMIN WITH MINERALS) TABS tablet Take 3 tablets by mouth daily. Total minerals     potassium chloride  SA (KLOR-CON  M) 20 MEQ tablet Take 3 tablets (60 mEq total) by mouth every morning AND 3 tablets (60 mEq total) every evening.     Probiotic Product (PROBIOTIC PO) Take 1 capsule by mouth 2 (two) times daily. total flora     rosuvastatin  (CRESTOR ) 20 MG tablet Take 1 tablet (20 mg total) by mouth daily. 90 tablet 3   testosterone  cypionate (DEPOTESTOSTERONE  CYPIONATE) 200 MG/ML injection Inject 300 mg into the muscle every 28 (twenty-eight) days.     torsemide  (DEMADEX ) 20 MG tablet Take 4 tablets (80 mg total) by mouth in the morning AND 3 tablets (60 mg total) every evening. 210 tablet 11   No current facility-administered medications for this visit.   Wt Readings from Last 3 Encounters:  05/22/24 85.5 kg (188 lb 9.6 oz)  05/08/24 87.3 kg (192 lb 6.4 oz)  04/24/24 85.7 kg (189 lb)   There were no vitals taken for this visit.  PHYSICAL EXAM General:  *** appearing.  No respiratory difficulty Neck: JVD *** cm.  Cor: Regular rate & rhythm. No murmurs. Lungs: clear Extremities: no edema  Neuro: alert & oriented x 3. Affect pleasant.    Assessment/Plan: 1.  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  This was a new finding on the 9/24 echo compared to 10/23 echo.  Echo in 9/24 showed EF 60-65%, mild LVH, D-shaped interventricular septum with severe RV enlargement, mild RV dysfunction, PASP 50 mmHg, TAVR valve with mean gradient 18 mmHg, mild MR, severe TR, dilated IVC. TEE in 11/24 showed EF 55-60%, mild LVH, mildly D-shaped septum with severe RV enlargement but normal RV systolic function, severe biatrial enlargement, severe TR (suspect atrial functional TR), bioprosthetic aortic valve s/p TAVR with no evidence for thrombus/pannus, trivial perivalvular leakage, and mean gradient down to 13 mmHg.   RHC 11/24 showed signs of RV failure with elevated RA pressure though PAPi was preserved at 2.8.  He ended up going to [facility] for TV repair 6/25. Post op echo in 7/25 showed EF 55-60%, LVH, flattened interventricular septum c/w RV pressure and volume overload, RV moderately dilated with normal systolic function, RVSP 66 mmHg, moderate RAE/severe LAE, s/p TV repair with mean gradient 4 mmHg and minimal TR, TAVR valve was stable, dilated IVC. Echo 05/09/24: EF 50-55%, septum flattened in systole and diastole c/w RV overload, RV moderately enlarged, severe BAE, RVSP 70 mmHg, mild to moderate TR, aortic valve prosthesis okay - He is volume overloaded on exam and by ReDS which is 57%. Suspect med compliance largely playing a role. He has not been taking once weekly metolazone .  - NYHA III, limited by fatigue. Will send for 80 mg lasix  IV at infusion center today. Take 2.5 mg metolazone  this afternoon with extra 40 mEq potassium. Hold afternoon dose of Torsemide .  - Continue Torsemide  80 q am/60 q pm - Start taking 2.5 mg metolazone  once weekly on Friday (first dose 12/5) with extra 40 mEq KCL - He stopped spironolactone  due to dizziness. He does not want to retry. - Failed SGLT2i due to side effects  - Continue TED hose - Labs  today, repeat labs at f/u in 1 week 2. Permanent Atrial Fibrillation: Rate controlled. - Continue Eliquis  5 mg bid.. 3. Aortic Stenosis s/p TAVR:  stable on 11/25 echo.  4. Hypertension: BP above goal but doubt he can manage any new medications - no change  5. Tricuspid regurgitation: This was severe on echo in 9/24 with dilated RV and D-shaped interventricular septum.  TEE in 11/24 showed severe TR, possibly atrial functional TR with dilated right atrium. The RV on 11/24 TEE is severely dilated though overall systolic function appears normal. TEE 10/25/23 with severe TR. He was referred to Dr. Consepcion at [facility] for evaluation for percutaneous tricuspid valve repair but ultimately went to [facility] for mini thoracotomy TV repair 12/03/23. Echo in 7/25 with TV mean gradient 4 mmHg and minimal TR. Mild to moderate TR on echo 11/25. - optimize RV failure w/ diuresis per above  - Will need antibiotics with dental work.  6. CAD: LHC at [facility] 6/25 showed severe 1-vessel coronary artery disease of the mid LAD, 80% stenosis. Pt declined PCI and opted for medical therapy. No chest pain.  - At this point, in absence of chest pain and with normal LV EF, will not push for PCI. With his poor med compliance, I doubt he would strictly adhere to DAPT  - LDL Goal < 55, most recent level 105.  He was started on a statin last visit, but stopped due to concerns it dropped his BP. Doubt this contributed to this. Discussed retrial and he is in agreement. Will restart Crestor  20 mg daily. If fails retrial then can refer to lipid clinic for PCSK9i - no ASA w/ Eliquis  use 7. Bilateral foot ulcers: Following in wound clinic.  Suspect venous stasis ulcers with h/o marked peripheral edema.  - Now healed 8. Anemia: Fe deficiency anemia.  - He has had IV Fe.   F/u  Beckey LITTIE Coe AGACNP-BC   05/26/2024

## 2024-05-28 ENCOUNTER — Ambulatory Visit (HOSPITAL_COMMUNITY)

## 2024-06-05 NOTE — Progress Notes (Signed)
 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 In 9/23, patient had TAVR for severe AS with 29 mm Edwards Sapien 3 THV.  Prior to TAVR, he had LHC in 8/23 showing minimal CAD.  Initial post-TAVR echo in 10/23 showed EF 60-65%, normal RV, trivial MR, TAVR valve with no PVL and mean gradient 15 mmHg.  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 Of note, patient was not taking Eliquis  at at time of 9/24 echo.  He had been concerned about bruising.  He started taking apixaban  in 10/24.   TEE 11/24 showed EF 55-60%, mild LVH, mildly D-shaped septum with severe RV enlargement but normal RV systolic function, severe biatrial enlargement, severe TR (suspect atrial functional TR), bioprosthetic aortic valve s/p TAVR with no evidence for thrombus/pannus, trivial perivalvular leakage, and mean gradient down to 13 mmHg.  RHC 11/24 showed elevated right-sided filling pressures with mild pulmonary hypertension and preserved PAPi. Torsemide  was increased to 40 qam/20 qpm.   He has had difficulty with fluid retention/RV failure in the setting of severe TR + poor compliance w/ diuretics. Has required treatment w/ Furoscix  and referrals to infusion clinic for IV Lasix . He failed SGLT2i. Patient self-discontinued Farxiga  given development of joint pain/ muscle aches.   Patient was evaluated for percutaneous tricuspid valve repair at [facility], but ultimately went to [facility] and had a minimally invasive tricuspid valve repair there on 12/03/23.  Also of note, pre-op diagnostic LHC in 6/25 showed severe 1-vessel  coronary artery disease of the mid LAD, 80% stenosis. Plan was for PCI post tricuspid valve repair however patient declined.   At initial followup after TV repair, he was markedly volume overloaded.  Diuretics have been titrated up.  He did not tolerate spironolactone  (says it made him feel too dizzy). Post-op echo here in 7/25 showed EF 55-60%, LVH, flattened interventricular septum c/w RV pressure and volume overload, RV moderately dilated with normal systolic function, RVSP 66 mmHg, moderate RAE/severe LAE, s/p TV repair with mean gradient 4 mmHg and minimal TR, bioprosthetic aortic valve s/p TAVR was stable, dilated IVC.   Has recently struggled with volume overload. Concern for compliance with home meds despite his reports he is taking them as prescribed. Torsemide  was increased and he was instructed to take metolazone  X 2 days. Echo 05/09/24: EF 50-55%, septum flattened in systole and diastole c/w RV overload, RV moderately enlarged, severe BAE, RVSP 70 mmHg, mild to moderate TR, aortic valve prosthesis okay   Today he returns for AHF follow up. At visit last week he was found to be volume overloaded (ReDS 57%), was not taking his metolazone  as prescribed. Overall feeling ***. Denies palpitations, CP, dizziness, edema, or PND/Orthopnea. *** SOB. Appetite ok. No fever or chills. Weight at home *** pounds. Taking all medications. Denies ETOH, tobacco or drug use.   ReDs reading: *** %, {Norm/abn:16337}   PMH: 1. HTN 2. Hyperlipidemia 3. Aortic stenosis: Severe. S/p TAVR in 9/23 with 29 mm Edwards Sapien 3 THV.  - Echo (10/23): EF 60-65%, normal RV, trivial MR, TAVR valve with no PVL and mean gradient  15 mmHg.  - Echo (9/24): EF 60-65%, mild LVH, D-shaped interventricular septum with severe RV enlargement, mild RV dysfunction, PASP 50 mmHg, TAVR valve with mean gradient 18 mmHg, mild MR, severe TR, dilated IVC.  - TEE (11/24): EF 55-60%, mild LVH, mildly D-shaped septum with severe RV  enlargement but normal RV systolic function, severe biatrial enlargement, severe TR (suspect atrial functional TR), bioprosthetic aortic valve s/p TAVR with no evidence for thrombus/pannus, trivial perivalvular leakage, and mean gradient down to 13 mmHg.  - Stable bioprosthetic aortic valve post-TAVR on 7/25 echo.  4. Tricuspid regurgitation: Severe.  Noted first on 9/24 echo.  TEE (11/24): EF 55-60%, mild LVH, mildly D-shaped septum with severe RV enlargement but normal RV systolic function, severe biatrial enlargement, severe TR (suspect atrial functional TR), bioprosthetic aortic valve s/p TAVR with no evidence for thrombus/pannus, trivial perivalvular leakage, and mean gradient down to 13 mmHg.  - RHC (11/24): Mean RA 10 with v-waves to 19; PA 42/14, mean 28; mean PCWP 10, CI 4.74, PVR 1.7 WU, PAPi 2.8.  - TEE (5/25 at [facility]): LVEF 65%, RV moderately reduced, well-seated TAVR valve, mild MR, severe TR associated with annular dilation. - Minimally invasive tricuspid valve repair at [facility] in 6/25.   - Echo (7/25): EF 55-60%, LVH, flattened interventricular septum c/w RV pressure and volume overload, RV moderately dilated with normal systolic function, RVSP 66 mmHg, moderate RAE/severe LAE, s/p TV repair with mean gradient 4 mmHg and minimal TR, bioprosthetic aortic valve s/p TAVR was stable, dilated IVC.  5. CAD: LHC (8/23) with minimal CAD (pre-TAVR).  LHC 6/25 with 80% mid LAD stenosis, medically managed.  6. Atrial fibrillation: Permanent 7. Iron deficiency anemia.   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   Family History  Problem Relation Age of Onset   Colon cancer Mother    Alzheimer's disease Father    CVA Father    Diabetes Brother    ROS: All systems reviewed and negative except as per HPI.   Current Outpatient Medications  Medication Sig Dispense Refill   amoxicillin  (AMOXIL ) 500 MG tablet Take 4 tablets (2,000 mg total) by mouth as  directed. 1 HOUR PRIOR TO DENTAL APPOINTMENTS 12 tablet 6   apixaban  (ELIQUIS ) 5 MG TABS tablet Take 1 tablet (5 mg total) by mouth 2 (two) times daily. 60 tablet 11   buprenorphine -naloxone  (SUBOXONE ) 8-2 mg SUBL SL tablet Place 2 tablets under the tongue daily.     clonazePAM  (KLONOPIN ) 0.5 MG tablet Take 0.5 mg by mouth 3 (three) times daily as needed for anxiety.     Collagen Hydrolysate POWD Take 3 applicators by mouth 2 (two) times daily.     Digestive Enzyme CAPS Take 1 capsule by mouth 2 (two) times daily.     metolazone  (ZAROXOLYN ) 2.5 MG tablet Take 1 tablet (2.5 mg total) by mouth once a week. Take an extra potassium (20meq) with Metolazone  dose 12 tablet 0   Multiple Vitamin (MULTIVITAMIN WITH MINERALS) TABS tablet Take 3 tablets by mouth daily. Total minerals     potassium chloride  SA (KLOR-CON  M) 20 MEQ tablet Take 3 tablets (60 mEq total) by mouth every morning AND 3 tablets (60 mEq total) every evening.     Probiotic Product (PROBIOTIC PO) Take 1 capsule by mouth 2 (two) times daily. total flora     rosuvastatin  (CRESTOR ) 20 MG tablet Take 1 tablet (20 mg total) by mouth daily. 90 tablet 3   testosterone  cypionate (  DEPOTESTOSTERONE CYPIONATE) 200 MG/ML injection Inject 300 mg into the muscle every 28 (twenty-eight) days.     torsemide  (DEMADEX ) 20 MG tablet Take 4 tablets (80 mg total) by mouth in the morning AND 3 tablets (60 mg total) every evening. 210 tablet 11   No current facility-administered medications for this visit.   Wt Readings from Last 3 Encounters:  05/22/24 85.5 kg (188 lb 9.6 oz)  05/08/24 87.3 kg (192 lb 6.4 oz)  04/24/24 85.7 kg (189 lb)   There were no vitals taken for this visit.  PHYSICAL EXAM General: *** appearing. No distress  Cardiac: JVP ~*** cm. No murmurs  Resp: Lung sounds clear and equal B/L Abdomen: Soft, non-distended.  Extremities: Warm and dry.  *** edema.  Neuro: A&O x3. Affect pleasant.   Assessment/Plan: 1.  Chronic Diastolic  Heart Failure w/ Prominent RV Dysfunction w/ Severe TR: Prominent RV dysfunction with severe TR.  This was a new finding on the 9/24 echo compared to 10/23 echo.  Echo in 9/24 showed EF 60-65%, mild LVH, D-shaped interventricular septum with severe RV enlargement, mild RV dysfunction, PASP 50 mmHg, TAVR valve with mean gradient 18 mmHg, mild MR, severe TR, dilated IVC. TEE in 11/24 showed EF 55-60%, mild LVH, mildly D-shaped septum with severe RV enlargement but normal RV systolic function, severe biatrial enlargement, severe TR (suspect atrial functional TR), bioprosthetic aortic valve s/p TAVR with no evidence for thrombus/pannus, trivial perivalvular leakage, and mean gradient down to 13 mmHg.   RHC 11/24 showed signs of RV failure with elevated RA pressure though PAPi was preserved at 2.8.  He ended up going to [facility] for TV repair 6/25. Post op echo in 7/25 showed EF 55-60%, LVH, flattened interventricular septum c/w RV pressure and volume overload, RV moderately dilated with normal systolic function, RVSP 66 mmHg, moderate RAE/severe LAE, s/p TV repair with mean gradient 4 mmHg and minimal TR, TAVR valve was stable, dilated IVC. Echo 05/09/24: EF 50-55%, septum flattened in systole and diastole c/w RV overload, RV moderately enlarged, severe BAE, RVSP 70 mmHg, mild to moderate TR, aortic valve prosthesis okay - He is volume overloaded on exam and by ReDS which is 57%. Suspect med compliance largely playing a role. He has not been taking once weekly metolazone .  - NYHA III, limited by fatigue. Will send for 80 mg lasix  IV at infusion center today. Take 2.5 mg metolazone  this afternoon with extra 40 mEq potassium. Hold afternoon dose of Torsemide .  - Continue Torsemide  80 q am/60 q pm - Start taking 2.5 mg metolazone  once weekly on Friday (first dose 12/5) with extra 40 mEq KCL - He stopped spironolactone  due to dizziness. He does not want to retry. - Failed SGLT2i due to side effects   - Continue TED hose - Labs today, repeat labs at f/u in 1 week  2. Permanent Atrial Fibrillation: Rate controlled. - Continue Eliquis  5 mg bid  3. Aortic Stenosis s/p TAVR:  stable on 11/25 echo.  4. Hypertension: BP above goal but doubt he can manage any new medications - no change  5. Tricuspid regurgitation: This was severe on echo in 9/24 with dilated RV and D-shaped interventricular septum.  TEE in 11/24 showed severe TR, possibly atrial functional TR with dilated right atrium. The RV on 11/24 TEE is severely dilated though overall systolic function appears normal. TEE 10/25/23 with severe TR. He was referred to Dr. Consepcion at [facility] for evaluation for percutaneous tricuspid valve repair  but ultimately went to [facility] for mini thoracotomy TV repair 12/03/23. Echo in 7/25 with TV mean gradient 4 mmHg and minimal TR. Mild to moderate TR on echo 11/25. - optimize RV failure w/ diuresis per above  - Will need antibiotics with dental work.   6. CAD: LHC at [facility] 6/25 showed severe 1-vessel coronary artery disease of the mid LAD, 80% stenosis. Pt declined PCI and opted for medical therapy. No chest pain.  - At this point, in absence of chest pain and with normal LV EF, will not push for PCI. With his poor med compliance, I doubt he would strictly adhere to DAPT  - LDL Goal < 55, most recent level 105.  He was started on a statin last visit, but stopped due to concerns it dropped his BP. Doubt this contributed to this. Discussed retrial and he is in agreement. Will restart Crestor  20 mg daily. If fails retrial then can refer to lipid clinic for PCSK9i - no ASA w/ Eliquis  use  7. Bilateral foot ulcers: Following in wound clinic.  Suspect venous stasis ulcers with h/o marked peripheral edema.  - Now healed  8. Anemia: Fe deficiency anemia.  - He has had IV Fe.   Follow up in *** with ***  Jocilynn Grade, NP 06/05/2024

## 2024-06-06 ENCOUNTER — Ambulatory Visit (HOSPITAL_COMMUNITY)
Admission: RE | Admit: 2024-06-06 | Discharge: 2024-06-06 | Disposition: A | Discharge status: Home / Self Care | Source: Ambulatory Visit | Attending: Cardiology | Admitting: Cardiology

## 2024-06-06 ENCOUNTER — Encounter (HOSPITAL_COMMUNITY): Payer: Self-pay | Admitting: Cardiology

## 2024-06-06 VITALS — BP 114/70 | HR 86 | Ht 74.0 in | Wt 191.0 lb

## 2024-06-06 DIAGNOSIS — I4821 Permanent atrial fibrillation: Secondary | ICD-10-CM | POA: Diagnosis not present

## 2024-06-06 DIAGNOSIS — I5042 Chronic combined systolic (congestive) and diastolic (congestive) heart failure: Secondary | ICD-10-CM

## 2024-06-06 DIAGNOSIS — Z79899 Other long term (current) drug therapy: Secondary | ICD-10-CM | POA: Diagnosis not present

## 2024-06-06 DIAGNOSIS — I2510 Atherosclerotic heart disease of native coronary artery without angina pectoris: Secondary | ICD-10-CM | POA: Insufficient documentation

## 2024-06-06 DIAGNOSIS — L97529 Non-pressure chronic ulcer of other part of left foot with unspecified severity: Secondary | ICD-10-CM | POA: Diagnosis not present

## 2024-06-06 DIAGNOSIS — L97519 Non-pressure chronic ulcer of other part of right foot with unspecified severity: Secondary | ICD-10-CM | POA: Insufficient documentation

## 2024-06-06 DIAGNOSIS — Z91148 Patient's other noncompliance with medication regimen for other reason: Secondary | ICD-10-CM | POA: Diagnosis not present

## 2024-06-06 DIAGNOSIS — Z7901 Long term (current) use of anticoagulants: Secondary | ICD-10-CM | POA: Diagnosis not present

## 2024-06-06 DIAGNOSIS — I10 Essential (primary) hypertension: Secondary | ICD-10-CM

## 2024-06-06 DIAGNOSIS — Z953 Presence of xenogenic heart valve: Secondary | ICD-10-CM | POA: Diagnosis not present

## 2024-06-06 DIAGNOSIS — I5032 Chronic diastolic (congestive) heart failure: Secondary | ICD-10-CM | POA: Insufficient documentation

## 2024-06-06 DIAGNOSIS — I110 Hypertensive heart disease with heart failure: Secondary | ICD-10-CM | POA: Insufficient documentation

## 2024-06-06 DIAGNOSIS — I350 Nonrheumatic aortic (valve) stenosis: Secondary | ICD-10-CM

## 2024-06-06 DIAGNOSIS — I361 Nonrheumatic tricuspid (valve) insufficiency: Secondary | ICD-10-CM

## 2024-06-06 LAB — LIPID PANEL
Cholesterol: 153 mg/dL (ref 0–200)
HDL: 42 mg/dL
LDL Cholesterol: 101 mg/dL — ABNORMAL HIGH (ref 0–99)
Total CHOL/HDL Ratio: 3.6 ratio
Triglycerides: 49 mg/dL
VLDL: 10 mg/dL (ref 0–40)

## 2024-06-06 LAB — BASIC METABOLIC PANEL WITH GFR
Anion gap: 4 — ABNORMAL LOW (ref 5–15)
BUN: 35 mg/dL — ABNORMAL HIGH (ref 8–23)
CO2: 37 mmol/L — ABNORMAL HIGH (ref 22–32)
Calcium: 8.6 mg/dL — ABNORMAL LOW (ref 8.9–10.3)
Chloride: 99 mmol/L (ref 98–111)
Creatinine, Ser: 1.22 mg/dL (ref 0.61–1.24)
GFR, Estimated: 59 mL/min — ABNORMAL LOW
Glucose, Bld: 112 mg/dL — ABNORMAL HIGH (ref 70–99)
Potassium: 4.6 mmol/L (ref 3.5–5.1)
Sodium: 140 mmol/L (ref 135–145)

## 2024-06-06 LAB — PRO BRAIN NATRIURETIC PEPTIDE: Pro Brain Natriuretic Peptide: 1093 pg/mL — ABNORMAL HIGH

## 2024-06-06 MED ORDER — METOLAZONE 2.5 MG PO TABS: 2.5000 mg | ORAL_TABLET | ORAL | 0 refills | Status: AC

## 2024-06-06 NOTE — Progress Notes (Signed)
"   ReDS Vest / Clip - 06/06/24 1035       ReDS Vest / Clip   Station Marker C    Ruler Value 27    ReDS Value Range High volume overload    ReDS Actual Value 56          "

## 2024-06-06 NOTE — Patient Instructions (Addendum)
.  Medication Changes:  CONTINUE WEEKLY METOLAZONE  ON FRIDAYS WITH EXTRA POTASSIUM   Lab Work:  Labs done today, your results will be available in MyChart, we will contact you for abnormal readings  PLEASE CALL US  IF YOUR WEIGHT GOES UP!   Follow-Up in: 3 weeks as scheduled with APP clinic   At the [facility], you and your health needs are our priority. We have a designated team specialized in the treatment of Heart Failure. This Care Team includes your primary Heart Failure Specialized Cardiologist (physician), Advanced Practice Providers (APPs- Physician Assistants and Nurse Practitioners), and Pharmacist who all work together to provide you with the care you need, when you need it.   You may see any of the following providers on your designated Care Team at your next follow up:  Dr. Toribio Fuel Dr. Ezra Shuck Dr. Odis Brownie Greig Mosses, NP Caffie Shed, [place] [facility], [place] Beckey Coe, NP Jordan Lee, NP Tinnie Redman, PharmD   Please be sure to bring in all your medications bottles to every appointment.   Need to Contact Us :  If you have any questions or concerns before your next appointment please send us  a message through [place] or call our office at [phone].    TO LEAVE A MESSAGE FOR THE NURSE SELECT OPTION 2, PLEASE LEAVE A MESSAGE INCLUDING: YOUR NAME DATE OF BIRTH CALL BACK NUMBER REASON FOR CALL**this is important as we prioritize the call backs  YOU WILL RECEIVE A CALL BACK THE SAME DAY AS LONG AS YOU CALL BEFORE 4:00 PM

## 2024-07-01 ENCOUNTER — Encounter (HOSPITAL_COMMUNITY): Payer: Self-pay

## 2024-07-01 ENCOUNTER — Ambulatory Visit (HOSPITAL_COMMUNITY)
Admission: RE | Admit: 2024-07-01 | Discharge: 2024-07-01 | Disposition: A | Source: Ambulatory Visit | Attending: Physician Assistant

## 2024-07-01 ENCOUNTER — Ambulatory Visit (HOSPITAL_COMMUNITY): Payer: Self-pay | Admitting: Physician Assistant

## 2024-07-01 VITALS — BP 108/50 | Wt 183.4 lb

## 2024-07-01 DIAGNOSIS — Z7901 Long term (current) use of anticoagulants: Secondary | ICD-10-CM | POA: Diagnosis not present

## 2024-07-01 DIAGNOSIS — I110 Hypertensive heart disease with heart failure: Secondary | ICD-10-CM | POA: Diagnosis not present

## 2024-07-01 DIAGNOSIS — I5032 Chronic diastolic (congestive) heart failure: Secondary | ICD-10-CM | POA: Diagnosis present

## 2024-07-01 DIAGNOSIS — I082 Rheumatic disorders of both aortic and tricuspid valves: Secondary | ICD-10-CM | POA: Insufficient documentation

## 2024-07-01 DIAGNOSIS — Z79899 Other long term (current) drug therapy: Secondary | ICD-10-CM | POA: Diagnosis not present

## 2024-07-01 DIAGNOSIS — I50810 Right heart failure, unspecified: Secondary | ICD-10-CM | POA: Diagnosis not present

## 2024-07-01 DIAGNOSIS — L97519 Non-pressure chronic ulcer of other part of right foot with unspecified severity: Secondary | ICD-10-CM | POA: Insufficient documentation

## 2024-07-01 DIAGNOSIS — I2510 Atherosclerotic heart disease of native coronary artery without angina pectoris: Secondary | ICD-10-CM | POA: Insufficient documentation

## 2024-07-01 DIAGNOSIS — I4821 Permanent atrial fibrillation: Secondary | ICD-10-CM | POA: Insufficient documentation

## 2024-07-01 DIAGNOSIS — L97529 Non-pressure chronic ulcer of other part of left foot with unspecified severity: Secondary | ICD-10-CM | POA: Diagnosis not present

## 2024-07-01 DIAGNOSIS — Z91148 Patient's other noncompliance with medication regimen for other reason: Secondary | ICD-10-CM | POA: Insufficient documentation

## 2024-07-01 DIAGNOSIS — I361 Nonrheumatic tricuspid (valve) insufficiency: Secondary | ICD-10-CM | POA: Diagnosis not present

## 2024-07-01 DIAGNOSIS — D509 Iron deficiency anemia, unspecified: Secondary | ICD-10-CM | POA: Insufficient documentation

## 2024-07-01 LAB — BASIC METABOLIC PANEL WITH GFR
Anion gap: 9 (ref 5–15)
BUN: 47 mg/dL — ABNORMAL HIGH (ref 8–23)
CO2: 40 mmol/L — ABNORMAL HIGH (ref 22–32)
Calcium: 8.9 mg/dL (ref 8.9–10.3)
Chloride: 93 mmol/L — ABNORMAL LOW (ref 98–111)
Creatinine, Ser: 1.57 mg/dL — ABNORMAL HIGH (ref 0.61–1.24)
GFR, Estimated: 44 mL/min — ABNORMAL LOW
Glucose, Bld: 101 mg/dL — ABNORMAL HIGH (ref 70–99)
Potassium: 3.1 mmol/L — ABNORMAL LOW (ref 3.5–5.1)
Sodium: 142 mmol/L (ref 135–145)

## 2024-07-01 LAB — PRO BRAIN NATRIURETIC PEPTIDE: Pro Brain Natriuretic Peptide: 1426 pg/mL — ABNORMAL HIGH

## 2024-07-01 MED ORDER — APIXABAN 5 MG PO TABS: 5.0000 mg | ORAL_TABLET | Freq: Two times a day (BID) | ORAL | 5 refills | Status: AC

## 2024-07-01 MED ORDER — POTASSIUM CHLORIDE CRYS ER 20 MEQ PO TBCR: EXTENDED_RELEASE_TABLET | ORAL | 3 refills | Status: AC

## 2024-07-01 NOTE — Patient Instructions (Addendum)
 Medication Changes:  ELIQUIS  REFILLED   Lab Work:  Labs done today, your results will be available in MyChart, we will contact you for abnormal readings.  Follow-Up in: 2 MONTHS WITH DR. ROLAN PLEASE CALL OUR OFFICE GET SCHEDULE AROUND [place]  At the [facility], you and your health needs are our priority. We have a designated team specialized in the treatment of Heart Failure. This Care Team includes your primary Heart Failure Specialized Cardiologist (physician), Advanced Practice Providers (APPs- Physician Assistants and Nurse Practitioners), and Pharmacist who all work together to provide you with the care you need, when you need it.   You may see any of the following providers on your designated Care Team at your next follow up:  Dr. Toribio Fuel Dr. Ezra Rolan Dr. Odis Brownie Greig Mosses, NP Caffie Shed, [place] [facility], [place] Beckey Coe, NP Jordan Lee, NP Tinnie Redman, PharmD   Please be sure to bring in all your medications bottles to every appointment.   Need to Contact Us :  If you have any questions or concerns before your next appointment please send us  a message through [place] or call our office at [phone].    TO LEAVE A MESSAGE FOR THE NURSE SELECT OPTION 2, PLEASE LEAVE A MESSAGE INCLUDING: YOUR NAME DATE OF BIRTH CALL BACK NUMBER REASON FOR CALL**this is important as we prioritize the call backs  YOU WILL RECEIVE A CALL BACK THE SAME DAY AS LONG AS YOU CALL BEFORE 4:00 PM

## 2024-07-01 NOTE — Progress Notes (Signed)
 "  ADVANCED HF CLINIC NOTE    PCP: Cleotilde Oneil FALCON, MD HF Cardiology: Dr. Rolan  84 y.o. with history of permanent atrial fibrillation, aortic stenosis s/p TAVR, and CHF. In 9/23, patient had TAVR for severe AS with 29 mm Edwards Sapien 3 THV.  Prior to TAVR, he had LHC in 8/23 showing minimal CAD.  Initial post-TAVR echo in 10/23 showed EF 60-65%, normal RV, trivial MR, TAVR valve with no PVL and mean gradient 15 mmHg.  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 Of note, patient was not taking Eliquis  at at time of 9/24 echo.  He had been concerned about bruising.  He started taking apixaban  in 10/24.   TEE 11/24 showed EF 55-60%, mild LVH, mildly D-shaped septum with severe RV enlargement but normal RV systolic function, severe biatrial enlargement, severe TR (suspect atrial functional TR), bioprosthetic aortic valve s/p TAVR with no evidence for thrombus/pannus, trivial perivalvular leakage, and mean gradient down to 13 mmHg.  RHC 11/24 showed elevated right-sided filling pressures with mild pulmonary hypertension and preserved PAPi. Torsemide  was increased to 40 qam/20 qpm.   He has had difficulty with fluid retention/RV failure in the setting of severe TR + poor compliance w/ diuretics. Has required treatment w/ Furoscix  and referrals to infusion clinic for IV Lasix . He failed SGLT2i. Patient self-discontinued Farxiga  given development of joint pain/ muscle aches.   Patient was evaluated for percutaneous tricuspid valve repair at [facility], but ultimately went to [facility] and had a minimally invasive tricuspid valve repair there on 12/03/23.  Also of note, pre-op diagnostic LHC in 6/25 showed severe 1-vessel  coronary artery disease of the mid LAD, 80% stenosis. Plan was for PCI post tricuspid valve repair however patient declined.   At initial followup after TV repair, he was markedly volume overloaded.  Diuretics have been titrated up.  He did not tolerate spironolactone  (says it made him feel too dizzy). Post-op echo here in 7/25 showed EF 55-60%, LVH, flattened interventricular septum c/w RV pressure and volume overload, RV moderately dilated with normal systolic function, RVSP 66 mmHg, moderate RAE/severe LAE, s/p TV repair with mean gradient 4 mmHg and minimal TR, bioprosthetic aortic valve s/p TAVR was stable, dilated IVC.   Echo 05/09/24: EF 50-55%, septum flattened in systole and diastole c/w RV overload, RV moderately enlarged, severe BAE, RVSP 70 mmHg, mild to moderate TR, aortic valve prosthesis okay  He has continued to struggle with volume overload which has been at least in part d/t noncompliance with diuretics.   Here today for close CHF follow-up.His weight is down 8 lb from last visit. Lower extremity edema improved. Wears compression stockings most days. No shortness of breath, orthopnea or PND. Walks on the treadmill for 30 minutes every other day. Also uses several wellness devices including ultraviolet light therapy and a water mat. He reports adherence with medications but dispense history does not clearly correlate with this.   Continues to work full-time. 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    PMH: 1. HTN 2. Hyperlipidemia 3. Aortic stenosis: Severe. S/p TAVR in 9/23 with 29 mm Edwards Sapien 3 THV.  - Echo (10/23): EF 60-65%, normal RV, trivial MR, TAVR valve with no PVL and mean  gradient 15 mmHg.  - Echo (9/24): EF 60-65%, mild LVH, D-shaped interventricular septum with severe RV enlargement, mild RV dysfunction, PASP 50 mmHg, TAVR valve with mean gradient 18 mmHg, mild MR, severe TR, dilated IVC.  - TEE (11/24): EF 55-60%, mild LVH, mildly D-shaped septum with severe  RV enlargement but normal RV systolic function, severe biatrial enlargement, severe TR (suspect atrial functional TR), bioprosthetic aortic valve s/p TAVR with no evidence for thrombus/pannus, trivial perivalvular leakage, and mean gradient down to 13 mmHg.  - Stable bioprosthetic aortic valve post-TAVR on 7/25 echo.  4. Tricuspid regurgitation: Severe.  Noted first on 9/24 echo.  TEE (11/24): EF 55-60%, mild LVH, mildly D-shaped septum with severe RV enlargement but normal RV systolic function, severe biatrial enlargement, severe TR (suspect atrial functional TR), bioprosthetic aortic valve s/p TAVR with no evidence for thrombus/pannus, trivial perivalvular leakage, and mean gradient down to 13 mmHg.  - RHC (11/24): Mean RA 10 with v-waves to 19; PA 42/14, mean 28; mean PCWP 10, CI 4.74, PVR 1.7 WU, PAPi 2.8.  - TEE (5/25 at [facility]): LVEF 65%, RV moderately reduced, well-seated TAVR valve, mild MR, severe TR associated with annular dilation. - Minimally invasive tricuspid valve repair at [facility] in 6/25.   - Echo (7/25): EF 55-60%, LVH, flattened interventricular septum c/w RV pressure and volume overload, RV moderately dilated with normal systolic function, RVSP 66 mmHg, moderate RAE/severe LAE, s/p TV repair with mean gradient 4 mmHg and minimal TR, bioprosthetic aortic valve s/p TAVR was stable, dilated IVC.  5. CAD: LHC (8/23) with minimal CAD (pre-TAVR).  LHC 6/25 with 80% mid LAD stenosis, medically managed.  6. Atrial fibrillation: Permanent 7. Iron deficiency anemia.   SH: Married, lives in [place], nonsmoker, rare ETOH.   Family History  Problem Relation Age of Onset   Colon cancer Mother    Alzheimer's disease Father    CVA Father    Diabetes Brother    ROS: All systems reviewed and negative except as per HPI.   Current Outpatient Medications  Medication Sig Dispense Refill   amoxicillin  (AMOXIL ) 500 MG tablet Take 4 tablets (2,000 mg total) by mouth as directed. 1 HOUR PRIOR TO  DENTAL APPOINTMENTS 12 tablet 6   apixaban  (ELIQUIS ) 5 MG TABS tablet Take 1 tablet (5 mg total) by mouth 2 (two) times daily. 60 tablet 11   buprenorphine -naloxone  (SUBOXONE ) 8-2 mg SUBL SL tablet Place 2 tablets under the tongue daily.     clonazePAM  (KLONOPIN ) 0.5 MG tablet Take 0.5 mg by mouth 3 (three) times daily as needed for anxiety.     Collagen Hydrolysate POWD Take 3 applicators by mouth 2 (two) times daily.     Digestive Enzyme CAPS Take 1 capsule by mouth 2 (two) times daily.     metolazone  (ZAROXOLYN ) 2.5 MG tablet Take 1 tablet (2.5 mg total) by mouth once a week. Take an extra potassium (20meq) with Metolazone  dose 12 tablet 0   Multiple Vitamin (MULTIVITAMIN WITH MINERALS) TABS tablet Take 3 tablets by mouth daily. Total minerals     potassium chloride  SA (KLOR-CON  M) 20 MEQ tablet Take 3 tablets (60 mEq total) by mouth every morning AND 3 tablets (60 mEq total) every evening.     Probiotic Product (PROBIOTIC PO) Take 1 capsule by mouth 2 (two) times daily. total flora     rosuvastatin  (CRESTOR ) 20 MG tablet Take 1 tablet (20 mg total) by mouth daily. 90 tablet 3   testosterone  cypionate (DEPOTESTOSTERONE CYPIONATE) 200  MG/ML injection Inject 300 mg into the muscle every 28 (twenty-eight) days.     torsemide  (DEMADEX ) 20 MG tablet Take 4 tablets (80 mg total) by mouth in the morning AND 3 tablets (60 mg total) every evening. 210 tablet 11   No current facility-administered medications for this encounter.   Wt Readings from Last 3 Encounters:  07/01/24 83.2 kg (183 lb 6.4 oz)  06/06/24 86.6 kg (191 lb)  05/22/24 85.5 kg (188 lb 9.6 oz)   BP (!) 108/50   Wt 83.2 kg (183 lb 6.4 oz)   BMI 23.55 kg/m   PHYSICAL EXAM General:  Well appearing elderly male Cor: JVP 7 cm. Regular rate & rhythm. 2/6 systolic murmur Lungs: clear Abdomen: soft, nontender, nondistended. Extremities: 1-2+ edema, chronic venous stasis changes to both lower legs Neuro: alert & orientedx3. Affect  pleasant   Assessment/Plan: 1.  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  This was a new finding on the 9/24 echo compared to 10/23 echo.  Echo in 9/24 showed EF 60-65%, mild LVH, D-shaped interventricular septum with severe RV enlargement, mild RV dysfunction, PASP 50 mmHg, TAVR valve with mean gradient 18 mmHg, mild MR, severe TR, dilated IVC. TEE in 11/24 showed EF 55-60%, mild LVH, mildly D-shaped septum with severe RV enlargement but normal RV systolic function, severe biatrial enlargement, severe TR (suspect atrial functional TR), bioprosthetic aortic valve s/p TAVR with no evidence for thrombus/pannus, trivial perivalvular leakage, and mean gradient down to 13 mmHg.   RHC 11/24 showed signs of RV failure with elevated RA pressure though PAPi was preserved at 2.8.  He ended up going to [facility] for TV repair 6/25. Post op echo in 7/25 showed EF 55-60%, LVH, flattened interventricular septum c/w RV pressure and volume overload, RV moderately dilated with normal systolic function, RVSP 66 mmHg, moderate RAE/severe LAE, s/p TV repair with mean gradient 4 mmHg and minimal TR, TAVR valve was stable, dilated IVC. Echo 05/09/24: EF 50-55%, septum flattened in systole and diastole c/w RV overload, RV moderately enlarged, severe BAE, RVSP 70 mmHg, mild to moderate TR, aortic valve prosthesis okay. - NYHA II. Volume improved, down 8 lb from last visit. ReDS 56% last visit, unable to obtain reading today (unstable signal message). - Continue Torsemide  80 q am/60 q pm. - Continue 2.5 mg metolazone  once weekly (takes on weekends) + extra 40 mEq KCL - Off spiro d/t dizziness. He does not want to retry. - Off SGLT2i due to side effects  - Continue TED hose 2. Permanent Atrial Fibrillation: Rate controlled. - Continue Eliquis  5 mg bid (age > 47, will need to reduce dose if Scr trends > 1.5). Concern for compliance based on  fill history. Sent in refills today. 3. Aortic Stenosis s/p TAVR:  stable on 11/25 echo.  4. Hypertension: BP controlled 5. Tricuspid regurgitation: This was severe on echo in 9/24 with dilated RV and D-shaped interventricular septum.  TEE in 11/24 showed severe TR, possibly atrial functional TR with dilated right atrium. The RV on 11/24 TEE is severely dilated though overall systolic function appears normal. TEE 10/25/23 with severe TR. He was referred to Dr. Consepcion at [facility] for evaluation for percutaneous tricuspid valve repair but ultimately went to [facility] for mini thoracotomy TV repair 12/03/23. Echo in 7/25 with TV mean gradient 4 mmHg and minimal TR. Mild to moderate TR on echo 11/25. - optimize RV  failure w/ diuresis per above  - Will need antibiotics with dental work.  6. CAD: LHC at [facility] 6/25 showed severe 1-vessel coronary artery disease of the mid LAD, 80% stenosis. Pt declined PCI and opted for medical therapy. No chest pain.  - At this point, in absence of chest pain and with normal LV EF, will not push for PCI. With his poor med compliance, I doubt he would strictly adhere to DAPT  - LDL Goal < 55 - He stopped statin with concerns it dropped his blood pressure. Later agreed to add back Rosuvastatin  but LDL remained elevated at 105 on lipid panel 12/19 (LDL same as it has been off statin). Doubt compliance.  - no ASA w/ Eliquis  use 7. Bilateral foot ulcers: Following in wound clinic.  Suspect venous stasis ulcers with h/o marked peripheral edema.  - Now healed 8. Anemia: Fe deficiency anemia.  - He has had IV Fe.   Follow up 2 months with Dr. Rolan  [facility], [facility], PA-C 07/01/2024  "

## 2024-07-08 ENCOUNTER — Ambulatory Visit (HOSPITAL_COMMUNITY)
Admission: RE | Admit: 2024-07-08 | Discharge: 2024-07-08 | Disposition: A | Discharge status: Home / Self Care | Source: Ambulatory Visit | Attending: Internal Medicine | Admitting: Internal Medicine

## 2024-07-08 DIAGNOSIS — I5032 Chronic diastolic (congestive) heart failure: Secondary | ICD-10-CM | POA: Insufficient documentation

## 2024-07-08 LAB — BASIC METABOLIC PANEL WITH GFR
Anion gap: 8 (ref 5–15)
BUN: 40 mg/dL — ABNORMAL HIGH (ref 8–23)
CO2: 32 mmol/L (ref 22–32)
Calcium: 8.7 mg/dL — ABNORMAL LOW (ref 8.9–10.3)
Chloride: 97 mmol/L — ABNORMAL LOW (ref 98–111)
Creatinine, Ser: 1.51 mg/dL — ABNORMAL HIGH (ref 0.61–1.24)
GFR, Estimated: 46 mL/min — ABNORMAL LOW
Glucose, Bld: 139 mg/dL — ABNORMAL HIGH (ref 70–99)
Potassium: 4.4 mmol/L (ref 3.5–5.1)
Sodium: 137 mmol/L (ref 135–145)

## 2024-07-09 ENCOUNTER — Ambulatory Visit (HOSPITAL_COMMUNITY): Payer: Self-pay | Admitting: Physician Assistant
